# Patient Record
Sex: Male | Born: 1975 | Race: White | Hispanic: No | Smoking: Current every day smoker
Health system: Southern US, Community
[De-identification: ages and names within clinical notes are randomized; demographics above are authoritative.]

## PROBLEM LIST (undated history)

## (undated) DIAGNOSIS — I429 Cardiomyopathy, unspecified: Secondary | ICD-10-CM

## (undated) DIAGNOSIS — I509 Heart failure, unspecified: Secondary | ICD-10-CM

## (undated) DIAGNOSIS — F1910 Other psychoactive substance abuse, uncomplicated: Secondary | ICD-10-CM

---

## 2018-07-18 DIAGNOSIS — I517 Cardiomegaly: Secondary | ICD-10-CM

## 2018-07-18 DIAGNOSIS — F1910 Other psychoactive substance abuse, uncomplicated: Secondary | ICD-10-CM

## 2018-07-18 DIAGNOSIS — I5023 Acute on chronic systolic (congestive) heart failure: Secondary | ICD-10-CM

## 2018-07-18 DIAGNOSIS — D72829 Elevated white blood cell count, unspecified: Secondary | ICD-10-CM

## 2018-07-18 DIAGNOSIS — R0602 Shortness of breath: Secondary | ICD-10-CM

## 2018-07-18 DIAGNOSIS — R0603 Acute respiratory distress: Secondary | ICD-10-CM

## 2018-08-26 DIAGNOSIS — D72829 Elevated white blood cell count, unspecified: Secondary | ICD-10-CM

## 2018-08-26 DIAGNOSIS — I5023 Acute on chronic systolic (congestive) heart failure: Secondary | ICD-10-CM

## 2018-08-27 ENCOUNTER — Inpatient Hospital Stay (HOSPITAL_COMMUNITY)
Admission: AD | Admit: 2018-08-27 | Discharge: 2018-10-12 | DRG: 870 | Disposition: E | Payer: Medicaid Other | Source: Other Acute Inpatient Hospital | Attending: Pulmonary Disease | Admitting: Pulmonary Disease

## 2018-08-27 ENCOUNTER — Inpatient Hospital Stay (HOSPITAL_COMMUNITY): Payer: Medicaid Other

## 2018-08-27 DIAGNOSIS — I472 Ventricular tachycardia: Secondary | ICD-10-CM | POA: Diagnosis present

## 2018-08-27 DIAGNOSIS — I427 Cardiomyopathy due to drug and external agent: Secondary | ICD-10-CM | POA: Diagnosis not present

## 2018-08-27 DIAGNOSIS — E162 Hypoglycemia, unspecified: Secondary | ICD-10-CM | POA: Diagnosis present

## 2018-08-27 DIAGNOSIS — G8194 Hemiplegia, unspecified affecting left nondominant side: Secondary | ICD-10-CM | POA: Diagnosis not present

## 2018-08-27 DIAGNOSIS — Z9911 Dependence on respirator [ventilator] status: Secondary | ICD-10-CM | POA: Diagnosis not present

## 2018-08-27 DIAGNOSIS — G92 Toxic encephalopathy: Secondary | ICD-10-CM | POA: Diagnosis not present

## 2018-08-27 DIAGNOSIS — E870 Hyperosmolality and hypernatremia: Secondary | ICD-10-CM | POA: Diagnosis not present

## 2018-08-27 DIAGNOSIS — J9621 Acute and chronic respiratory failure with hypoxia: Secondary | ICD-10-CM | POA: Diagnosis present

## 2018-08-27 DIAGNOSIS — R0902 Hypoxemia: Secondary | ICD-10-CM

## 2018-08-27 DIAGNOSIS — K8000 Calculus of gallbladder with acute cholecystitis without obstruction: Secondary | ICD-10-CM | POA: Diagnosis not present

## 2018-08-27 DIAGNOSIS — Z9119 Patient's noncompliance with other medical treatment and regimen: Secondary | ICD-10-CM

## 2018-08-27 DIAGNOSIS — I5082 Biventricular heart failure: Secondary | ICD-10-CM | POA: Diagnosis present

## 2018-08-27 DIAGNOSIS — I480 Paroxysmal atrial fibrillation: Secondary | ICD-10-CM | POA: Diagnosis not present

## 2018-08-27 DIAGNOSIS — I5023 Acute on chronic systolic (congestive) heart failure: Secondary | ICD-10-CM | POA: Diagnosis present

## 2018-08-27 DIAGNOSIS — K810 Acute cholecystitis: Secondary | ICD-10-CM

## 2018-08-27 DIAGNOSIS — T4275XA Adverse effect of unspecified antiepileptic and sedative-hypnotic drugs, initial encounter: Secondary | ICD-10-CM | POA: Diagnosis not present

## 2018-08-27 DIAGNOSIS — R7989 Other specified abnormal findings of blood chemistry: Secondary | ICD-10-CM

## 2018-08-27 DIAGNOSIS — N170 Acute kidney failure with tubular necrosis: Secondary | ICD-10-CM | POA: Diagnosis present

## 2018-08-27 DIAGNOSIS — Z978 Presence of other specified devices: Secondary | ICD-10-CM

## 2018-08-27 DIAGNOSIS — J9690 Respiratory failure, unspecified, unspecified whether with hypoxia or hypercapnia: Secondary | ICD-10-CM

## 2018-08-27 DIAGNOSIS — K567 Ileus, unspecified: Secondary | ICD-10-CM | POA: Diagnosis present

## 2018-08-27 DIAGNOSIS — K7200 Acute and subacute hepatic failure without coma: Secondary | ICD-10-CM | POA: Diagnosis present

## 2018-08-27 DIAGNOSIS — I110 Hypertensive heart disease with heart failure: Secondary | ICD-10-CM | POA: Diagnosis present

## 2018-08-27 DIAGNOSIS — A419 Sepsis, unspecified organism: Principal | ICD-10-CM | POA: Diagnosis present

## 2018-08-27 DIAGNOSIS — J14 Pneumonia due to Hemophilus influenzae: Secondary | ICD-10-CM | POA: Diagnosis present

## 2018-08-27 DIAGNOSIS — E872 Acidosis: Secondary | ICD-10-CM | POA: Diagnosis present

## 2018-08-27 DIAGNOSIS — Z515 Encounter for palliative care: Secondary | ICD-10-CM | POA: Diagnosis not present

## 2018-08-27 DIAGNOSIS — Z79899 Other long term (current) drug therapy: Secondary | ICD-10-CM

## 2018-08-27 DIAGNOSIS — R6521 Severe sepsis with septic shock: Secondary | ICD-10-CM | POA: Diagnosis present

## 2018-08-27 DIAGNOSIS — I340 Nonrheumatic mitral (valve) insufficiency: Secondary | ICD-10-CM

## 2018-08-27 DIAGNOSIS — Y92230 Patient room in hospital as the place of occurrence of the external cause: Secondary | ICD-10-CM | POA: Diagnosis not present

## 2018-08-27 DIAGNOSIS — F1910 Other psychoactive substance abuse, uncomplicated: Secondary | ICD-10-CM

## 2018-08-27 DIAGNOSIS — F1510 Other stimulant abuse, uncomplicated: Secondary | ICD-10-CM | POA: Diagnosis present

## 2018-08-27 DIAGNOSIS — R570 Cardiogenic shock: Secondary | ICD-10-CM | POA: Diagnosis present

## 2018-08-27 DIAGNOSIS — E873 Alkalosis: Secondary | ICD-10-CM | POA: Diagnosis not present

## 2018-08-27 DIAGNOSIS — R17 Unspecified jaundice: Secondary | ICD-10-CM

## 2018-08-27 DIAGNOSIS — T43621S Poisoning by amphetamines, accidental (unintentional), sequela: Secondary | ICD-10-CM | POA: Diagnosis not present

## 2018-08-27 DIAGNOSIS — D689 Coagulation defect, unspecified: Secondary | ICD-10-CM | POA: Diagnosis not present

## 2018-08-27 DIAGNOSIS — I63411 Cerebral infarction due to embolism of right middle cerebral artery: Secondary | ICD-10-CM | POA: Diagnosis not present

## 2018-08-27 DIAGNOSIS — Z66 Do not resuscitate: Secondary | ICD-10-CM | POA: Diagnosis not present

## 2018-08-27 DIAGNOSIS — I6340 Cerebral infarction due to embolism of unspecified cerebral artery: Secondary | ICD-10-CM

## 2018-08-27 DIAGNOSIS — J9600 Acute respiratory failure, unspecified whether with hypoxia or hypercapnia: Secondary | ICD-10-CM

## 2018-08-27 DIAGNOSIS — F1210 Cannabis abuse, uncomplicated: Secondary | ICD-10-CM | POA: Diagnosis present

## 2018-08-27 DIAGNOSIS — R000 Tachycardia, unspecified: Secondary | ICD-10-CM

## 2018-08-27 DIAGNOSIS — J9601 Acute respiratory failure with hypoxia: Secondary | ICD-10-CM

## 2018-08-27 DIAGNOSIS — I509 Heart failure, unspecified: Secondary | ICD-10-CM

## 2018-08-27 DIAGNOSIS — Z0189 Encounter for other specified special examinations: Secondary | ICD-10-CM

## 2018-08-27 DIAGNOSIS — Z7189 Other specified counseling: Secondary | ICD-10-CM

## 2018-08-27 DIAGNOSIS — N19 Unspecified kidney failure: Secondary | ICD-10-CM

## 2018-08-27 DIAGNOSIS — I517 Cardiomegaly: Secondary | ICD-10-CM

## 2018-08-27 DIAGNOSIS — Z880 Allergy status to penicillin: Secondary | ICD-10-CM

## 2018-08-27 DIAGNOSIS — R140 Abdominal distension (gaseous): Secondary | ICD-10-CM

## 2018-08-27 DIAGNOSIS — I361 Nonrheumatic tricuspid (valve) insufficiency: Secondary | ICD-10-CM

## 2018-08-27 DIAGNOSIS — K819 Cholecystitis, unspecified: Secondary | ICD-10-CM

## 2018-08-27 DIAGNOSIS — I5043 Acute on chronic combined systolic (congestive) and diastolic (congestive) heart failure: Secondary | ICD-10-CM

## 2018-08-27 DIAGNOSIS — I2510 Atherosclerotic heart disease of native coronary artery without angina pectoris: Secondary | ICD-10-CM | POA: Diagnosis present

## 2018-08-27 DIAGNOSIS — R945 Abnormal results of liver function studies: Secondary | ICD-10-CM

## 2018-08-27 HISTORY — DX: Heart failure, unspecified: I50.9

## 2018-08-27 HISTORY — DX: Cardiomyopathy, unspecified: I42.9

## 2018-08-27 HISTORY — DX: Other psychoactive substance abuse, uncomplicated: F19.10

## 2018-08-27 LAB — BASIC METABOLIC PANEL
ANION GAP: 12 (ref 5–15)
BUN: 20 mg/dL (ref 6–20)
CO2: 21 mmol/L — ABNORMAL LOW (ref 22–32)
Calcium: 8.2 mg/dL — ABNORMAL LOW (ref 8.9–10.3)
Chloride: 101 mmol/L (ref 98–111)
Creatinine, Ser: 2.21 mg/dL — ABNORMAL HIGH (ref 0.61–1.24)
GFR calc Af Amer: 41 mL/min — ABNORMAL LOW (ref >60)
GFR calc non Af Amer: 35 mL/min — ABNORMAL LOW (ref >60)
GLUCOSE: 64 mg/dL — AB (ref 70–99)
Potassium: 4.9 mmol/L (ref 3.5–5.1)
SODIUM: 134 mmol/L — AB (ref 135–145)

## 2018-08-27 LAB — CBC
HEMATOCRIT: 45.2 % (ref 39.0–52.0)
HEMOGLOBIN: 14.3 g/dL (ref 13.0–17.0)
MCH: 27.7 pg (ref 26.0–34.0)
MCHC: 31.6 g/dL (ref 30.0–36.0)
MCV: 87.6 fL (ref 80.0–100.0)
Platelets: 207 10*3/uL (ref 150–400)
RBC: 5.16 MIL/uL (ref 4.22–5.81)
RDW: 16.6 % — AB (ref 11.5–15.5)
WBC: 22.4 10*3/uL — ABNORMAL HIGH (ref 4.0–10.5)
nRBC: 0 % (ref 0.0–0.2)

## 2018-08-27 LAB — PROTIME-INR
INR: 2.58
PROTHROMBIN TIME: 27.3 s — AB (ref 11.4–15.2)

## 2018-08-27 LAB — URINALYSIS, ROUTINE W REFLEX MICROSCOPIC
Bilirubin Urine: NEGATIVE
Glucose, UA: NEGATIVE mg/dL
Ketones, ur: NEGATIVE mg/dL
Leukocytes, UA: NEGATIVE
Nitrite: NEGATIVE
PROTEIN: 100 mg/dL — AB
Specific Gravity, Urine: 1.015 (ref 1.005–1.030)
pH: 5 (ref 5.0–8.0)

## 2018-08-27 LAB — HEPATIC FUNCTION PANEL
ALT: 2177 U/L — ABNORMAL HIGH (ref 0–44)
AST: 3397 U/L — AB (ref 15–41)
Albumin: 3 g/dL — ABNORMAL LOW (ref 3.5–5.0)
Alkaline Phosphatase: 114 U/L (ref 38–126)
BILIRUBIN DIRECT: 2.9 mg/dL — AB (ref 0.0–0.2)
Indirect Bilirubin: 2.4 mg/dL — ABNORMAL HIGH (ref 0.3–0.9)
TOTAL PROTEIN: 6.4 g/dL — AB (ref 6.5–8.1)
Total Bilirubin: 5.3 mg/dL — ABNORMAL HIGH (ref 0.3–1.2)

## 2018-08-27 LAB — COOXEMETRY PANEL
CARBOXYHEMOGLOBIN: 1.1 % (ref 0.5–1.5)
Carboxyhemoglobin: 1.1 % (ref 0.5–1.5)
Methemoglobin: 0.9 % (ref 0.0–1.5)
Methemoglobin: 1.6 % — ABNORMAL HIGH (ref 0.0–1.5)
O2 SAT: 81.1 %
O2 Saturation: 64.5 %
TOTAL HEMOGLOBIN: 14.7 g/dL (ref 12.0–16.0)
Total hemoglobin: 14.6 g/dL (ref 12.0–16.0)

## 2018-08-27 LAB — GLUCOSE, CAPILLARY: GLUCOSE-CAPILLARY: 124 mg/dL — AB (ref 70–99)

## 2018-08-27 LAB — POCT I-STAT 3, ART BLOOD GAS (G3+)
Acid-base deficit: 2 mmol/L (ref 0.0–2.0)
BICARBONATE: 23.1 mmol/L (ref 20.0–28.0)
O2 Saturation: 99 %
TCO2: 24 mmol/L (ref 22–32)
pCO2 arterial: 39.8 mmHg (ref 32.0–48.0)
pH, Arterial: 7.374 (ref 7.350–7.450)
pO2, Arterial: 146 mmHg — ABNORMAL HIGH (ref 83.0–108.0)

## 2018-08-27 LAB — MRSA PCR SCREENING: MRSA BY PCR: POSITIVE — AB

## 2018-08-27 LAB — PHOSPHORUS: Phosphorus: 7.2 mg/dL — ABNORMAL HIGH (ref 2.5–4.6)

## 2018-08-27 LAB — APTT: aPTT: 70 seconds — ABNORMAL HIGH (ref 24–36)

## 2018-08-27 LAB — LACTIC ACID, PLASMA: Lactic Acid, Venous: 3.1 mmol/L (ref 0.5–1.9)

## 2018-08-27 LAB — TRIGLYCERIDES: Triglycerides: 47 mg/dL (ref <150)

## 2018-08-27 LAB — MAGNESIUM: MAGNESIUM: 2.1 mg/dL (ref 1.7–2.4)

## 2018-08-27 LAB — HEPARIN LEVEL (UNFRACTIONATED): HEPARIN UNFRACTIONATED: 0.2 [IU]/mL — AB (ref 0.30–0.70)

## 2018-08-27 MED ORDER — FENTANYL 2500MCG IN NS 250ML (10MCG/ML) PREMIX INFUSION
0.0000 ug/h | INTRAVENOUS | Status: DC
  Administered 2018-08-27: 100 ug/h via INTRAVENOUS
  Administered 2018-08-28: 375 ug/h via INTRAVENOUS
  Administered 2018-08-28 – 2018-08-29 (×4): 400 ug/h via INTRAVENOUS
  Administered 2018-08-29: 300 ug/h via INTRAVENOUS
  Administered 2018-08-29: 400 ug/h via INTRAVENOUS
  Administered 2018-08-30: 325 ug/h via INTRAVENOUS
  Administered 2018-08-30: 250 ug/h via INTRAVENOUS
  Administered 2018-08-30: 350 ug/h via INTRAVENOUS
  Administered 2018-08-31: 375 ug/h via INTRAVENOUS
  Administered 2018-08-31: 325 ug/h via INTRAVENOUS
  Administered 2018-08-31 – 2018-09-01 (×2): 400 ug/h via INTRAVENOUS
  Administered 2018-09-01: 200 ug/h via INTRAVENOUS
  Administered 2018-09-02: 325 ug/h via INTRAVENOUS
  Administered 2018-09-02: 300 ug/h via INTRAVENOUS
  Filled 2018-08-27 (×18): qty 250

## 2018-08-27 MED ORDER — PROPOFOL 1000 MG/100ML IV EMUL
INTRAVENOUS | Status: AC
  Filled 2018-08-27: qty 100

## 2018-08-27 MED ORDER — CHLORHEXIDINE GLUCONATE CLOTH 2 % EX PADS
6.0000 | MEDICATED_PAD | Freq: Every day | CUTANEOUS | Status: AC
  Administered 2018-08-27 – 2018-08-29 (×3): 6 via TOPICAL

## 2018-08-27 MED ORDER — ASPIRIN 300 MG RE SUPP
300.0000 mg | RECTAL | Status: AC
  Administered 2018-08-27: 300 mg via RECTAL
  Filled 2018-08-27: qty 1

## 2018-08-27 MED ORDER — ASPIRIN 81 MG PO CHEW: 324.0000 mg | CHEWABLE_TABLET | ORAL | Status: AC

## 2018-08-27 MED ORDER — NOREPINEPHRINE 16 MG/250ML-% IV SOLN
0.0000 ug/min | INTRAVENOUS | Status: DC
  Administered 2018-08-27: 2 ug/min via INTRAVENOUS
  Administered 2018-08-28 (×2): 60 ug/min via INTRAVENOUS
  Administered 2018-08-28: 70 ug/min via INTRAVENOUS
  Administered 2018-08-29: 40 ug/min via INTRAVENOUS
  Administered 2018-08-29: 22 ug/min via INTRAVENOUS
  Administered 2018-08-29: 65 ug/min via INTRAVENOUS
  Administered 2018-08-30: 18 ug/min via INTRAVENOUS
  Administered 2018-08-31: 12 ug/min via INTRAVENOUS
  Administered 2018-09-04 – 2018-09-08 (×2): 2 ug/min via INTRAVENOUS
  Filled 2018-08-27 (×15): qty 250

## 2018-08-27 MED ORDER — FUROSEMIDE 10 MG/ML IJ SOLN
80.0000 mg | Freq: Once | INTRAMUSCULAR | Status: AC
  Administered 2018-08-27: 80 mg via INTRAVENOUS
  Filled 2018-08-27: qty 8

## 2018-08-27 MED ORDER — MILRINONE LACTATE IN DEXTROSE 20-5 MG/100ML-% IV SOLN
0.3750 ug/kg/min | INTRAVENOUS | Status: DC
  Administered 2018-08-27 – 2018-08-28 (×2): 0.375 ug/kg/min via INTRAVENOUS
  Filled 2018-08-27 (×3): qty 100

## 2018-08-27 MED ORDER — FAMOTIDINE IN NACL 20-0.9 MG/50ML-% IV SOLN
20.0000 mg | Freq: Two times a day (BID) | INTRAVENOUS | Status: DC
  Administered 2018-08-27 – 2018-08-29 (×4): 20 mg via INTRAVENOUS
  Filled 2018-08-27 (×4): qty 50

## 2018-08-27 MED ORDER — MUPIROCIN 2 % EX OINT
1.0000 "application " | TOPICAL_OINTMENT | Freq: Two times a day (BID) | CUTANEOUS | Status: AC
  Administered 2018-08-27 – 2018-09-01 (×10): 1 via NASAL
  Filled 2018-08-27 (×3): qty 22

## 2018-08-27 MED ORDER — ORAL CARE MOUTH RINSE
15.0000 mL | OROMUCOSAL | Status: DC
  Administered 2018-08-27 – 2018-08-30 (×29): 15 mL via OROMUCOSAL

## 2018-08-27 MED ORDER — CHLORHEXIDINE GLUCONATE 0.12% ORAL RINSE (MEDLINE KIT)
15.0000 mL | Freq: Two times a day (BID) | OROMUCOSAL | Status: DC
  Administered 2018-08-27 – 2018-08-30 (×5): 15 mL via OROMUCOSAL

## 2018-08-27 MED ORDER — PROPOFOL 1000 MG/100ML IV EMUL
5.0000 ug/kg/min | INTRAVENOUS | Status: DC
  Administered 2018-08-27: 20 ug/kg/min via INTRAVENOUS
  Filled 2018-08-27: qty 100

## 2018-08-27 MED ORDER — HEPARIN (PORCINE) IN NACL 100-0.45 UNIT/ML-% IJ SOLN
1550.0000 [IU]/h | INTRAMUSCULAR | Status: DC
  Administered 2018-08-27: 1400 [IU]/h via INTRAVENOUS
  Administered 2018-08-28: 1550 [IU]/h via INTRAVENOUS
  Filled 2018-08-27: qty 250

## 2018-08-27 MED ORDER — SODIUM CHLORIDE 0.9 % IV SOLN
250.0000 mL | INTRAVENOUS | Status: DC
  Administered 2018-08-27: 250 mL via INTRAVENOUS

## 2018-08-27 NOTE — Progress Notes (Signed)
Bensimhon, MD called and notified of repeat CoOx and increased heart rate in the 120s-130s sinus tach. Verbal orders to keep systolic blood pressure above 95 and make sure patient has adequate urine output. Will initiate orders and continue to monitor patient.

## 2018-08-27 NOTE — Plan of Care (Signed)
  Problem: Cardiac: Goal: Ability to achieve and maintain adequate cardiopulmonary perfusion will improve Outcome: Progressing   Problem: Clinical Measurements: Goal: Ability to maintain clinical measurements within normal limits will improve Outcome: Progressing Goal: Will remain free from infection Outcome: Progressing Goal: Respiratory complications will improve Outcome: Progressing Goal: Cardiovascular complication will be avoided Outcome: Progressing   Problem: Education: Goal: Ability to demonstrate management of disease process will improve Outcome: Not Progressing Note:  Patient sedated and ventilated. Goal: Ability to verbalize understanding of medication therapies will improve Outcome: Not Progressing Note:  Patient sedated and intubated.

## 2018-08-27 NOTE — Progress Notes (Signed)
[facility] for heparin Indication: r/o PE  Allergies  Allergen Reactions  . Amoxicillin Swelling  . Penicillins Swelling    Patient Measurements: Height: 6' (182.9 cm) Weight: 202 lb 13.2 oz (92 kg) IBW/kg (Calculated) : 77.6 Heparin Dosing Weight: 92 kg   Vital Signs: Temp: 98.9 F (37.2 C) (10/16 2000) Temp Source: Oral (10/16 2000) BP: 121/71 (10/16 2330) Pulse Rate: 103 (10/16 1726)  Labs: Recent Labs    08/14/2018 1851 08/21/2018 2005  HGB 14.3  --   HCT 45.2  --   PLT 207  --   APTT 70*  --   LABPROT 27.3*  --   INR 2.58  --   HEPARINUNFRC  --  0.20*  CREATININE 2.21*  --     Estimated Creatinine Clearance: 48.8 mL/min (A) (by C-G formula based on SCr of 2.21 mg/dL (H)).   Medical History: No past medical history on file.  Medications:  Scheduled:  . chlorhexidine gluconate (MEDLINE KIT)  15 mL Mouth Rinse BID  . [START ON 08/22/2018] Chlorhexidine Gluconate Cloth  6 each Topical Q0600  . mouth rinse  15 mL Mouth Rinse 10 times per day  . mupirocin ointment  1 application Nasal BID    Assessment: 32 yom presented to OSH with hx of CHF (LVEF 10%), with s/sx of low output. D-dimer at OSH was 682. Started on heparin bolus of 5000 units then 1400 units/hr on 10/16 ~1100.   LFTs and Scr at OSH WNL >> AST 3397/ALT 2177, Scr 2.21 tonight. INR 2.58 likely secondary to shock liver. Hgb 14.3, plt 207. No s/sx of bleeding.    Heparin level 0.20 units/ml  Goal of Therapy:  Heparin level 0.3-0.7 units/ml Monitor platelets by anticoagulation protocol: Yes   Plan:  Increase heparin infusion to 1550 units/hr  Monitor daily HL, CBC, and for s/sx of bleeding   Thanks for allowing pharmacy to be a part of this patient's care.  Excell Seltzer, PharmD Clinical Pharmacist

## 2018-08-27 NOTE — Progress Notes (Signed)
CRITICAL VALUE ALERT  Critical Value:  Lactic 3.1  Date & Time Notied:  08/19/2018   Provider Notified: Denese Killings, MD  Orders Received/Actions taken: Verbal orders to repeat Lactic in 4 hours and keep MAP 60-65.

## 2018-08-27 NOTE — H&P (Signed)
NAME:  Cody Patterson, MRN:  [id number], DOB:  June 27, 1976, LOS: 0 ADMISSION DATE:  08/18/2018, CONSULTATION DATE:  09/04/2018 REFERRING MD:  Derek Mound, CHIEF COMPLAINT:  Cardiogenic shock   Brief History   42 year old man with history of 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 Presented to [facility] with increasing dyspnea. Acute decompensation with hypotension and severe respiratory distress requiring intubation. Transferred to [facility] for management of advanced heart failure.   Past Medical History  Known cardiomyopathy and drug abuse (THC and methamphetamines) Significant Hospital Events   Intubated 10/16 at [facility].  Consults: date of consult/date signed off & final recs:  Cardiology consultation pending  Procedures (surgical and bedside):  R subclavian CVC 10/16 RH L femoral arterial line 10/16 [facility]  Significant Diagnostic Tests:  Echocardiogram from [facility] 10/16: LV moderately dilated, severely impaired EF at 10%. Moderate RV dysfunction. Global hypokinesis. Moderately elevated RVSP.  Micro Data:  N/A  Antimicrobials:  N/A   Subjective:  Received from [facility] intubated. Given glucose on route for CBG of 20.  Objective   Blood pressure (!) 84/65, pulse (!) 103, resp. rate 20, height 6' (1.829 m), weight 92 kg, SpO2 100 %.    Vent Mode: PRVC FiO2 (%):  [40 %] 40 % Set Rate:  [16 bmp] 16 bmp Vt Set:  [440 mL] 620 mL PEEP:  [10 cmH20] 10 cmH20  No intake or output data in the 24 hours ending 09/09/2018 1918 Filed Weights   09/05/2018 1845  Weight: 92 kg    Examination: General: Well nourished man. Currently sedated. HENT: ETT and OGT in place with no pressure ulcers. Lungs: Mechanically ventilated on PRVC with no asynchrony. Cardiovascular: Extremities cool and mottled.  Abdomen: Scaphoid. Soft and non-tender. Extremities: No edema Neuro: Sedated with no response to painful stimuli GU: Foley in place with small quantity of concentrated  urine.  I performed a focused echocardiogram which re-demonstrated biventricular failure with IVC plethora.  Resolved Hospital Problem list   N/A  Assessment & Plan:   Patient is critically ill due to decompensated systolic heart failure with cardiogenic shock (hemodynamic class C - cold and wet) requiring initiation and titration of vasoactive infusion to restore adequate BP and organ perfusion.   Have started norepinephrine to offset any initial excessive vasodilatation from milrinone. Goal to wean NE off if possible. Continuous BP monitoring to target MAP>65. Monitor perfusion by following hourly urine output, ScvO2 and lactate clearance. Given his history of non-compliance not a candidate for long-term MCS, but might benefit from short term support from [facility].  Cardiology consultation.   Oliguric renal failure - repeat chemistry pending, but urine output has improved with initiation of vasopressors and motteling has also improved.  Foley necessary to accurately measure urine output. Follow chemistry. Once perfusion improves and able to maintain consistent blood pressure, will attempt to diurese.  May require CRRT.  Acute hypoxic respiratory failure due to pulmonary edema requiring mechanical ventilation. Improving gas exchange on repeat ABG. Continue to maintain lung protective ventilation with full support until hemodynamics improve and able to clear edema.  Disposition / Summary of Today's Plan 09/02/2018   Improve hemodynamics overnight. Attempt to diureses   Best Practice   Diet: NPO -trickle feeds Pain/Anxiety/Delirium protocol (if indicated): Fentanyl and propofol. VAP protocol (if indicated):  Bundle in place. DVT prophylaxis: systemic heparin. GI prophylaxis: Famotidine Hyperglycemia protocol: monitor for hypoglycemia Mobility: bedrest Code Status: full Family Communication: no contact with family since  arrival here.  Labs personally reviewed.  CBC: reactive  leukocytosis. Recent Labs  Lab 09/03/2018 1851  WBC 22.4*  HGB 14.3  HCT 45.2  MCV 87.6  PLT 207    Basic Metabolic Panel: pending No results for input(s): NA, K, CL, CO2, GLUCOSE, BUN, CREATININE, CALCIUM, MG, PHOS in the last 168 hours. GFR: CrCl cannot be calculated (No successful lab value found.). Recent Labs  Lab 08/26/2018 1851  WBC 22.4*    Liver Function Tests: No results for input(s): AST, ALT, ALKPHOS, BILITOT, PROT, ALBUMIN in the last 168 hours. No results for input(s): LIPASE, AMYLASE in the last 168 hours. No results for input(s): AMMONIA in the last 168 hours.  ABG    Component Value Date/Time   PHART 7.374 08/30/2018 1855   PCO2ART 39.8 09/05/2018 1855   PO2ART 146.0 (H) 08/16/2018 1855   HCO3 23.1 08/17/2018 1855   TCO2 24 08/13/2018 1855   ACIDBASEDEF 2.0 08/21/2018 1855   O2SAT 99.0 09/01/2018 1855     Coagulation Profile: No results for input(s): INR, PROTIME in the last 168 hours.  Cardiac Enzymes: No results for input(s): CKTOTAL, CKMB, CKMBINDEX, TROPONINI in the last 168 hours.  HbA1C: No results found for: HGBA1C  CBG: Recent Labs  Lab 09/06/2018 1732  GLUCAP 124*    Admitting History of Present Illness.   42 year male with history of systolic heart failure. Several admissions for SOB recently but has left AMA prior to completing work-up. Presented with increasing dyspnea, leg edema, orthopnea and PND. NYHA III-IV symptoms at home. Last used methamphetamine 10 days ago. In ED at [facility] was SOB with BNP 11800. Initially admitted to hospitalist before decompensation, but became very dyspneic and mottled and required intubation. Pressors not started due to tachycardia.  History from chart from OSH as patient unable to participate due to sedation.  Review of Systems:   Unable to obtain due to sedation  Past Medical History  As above.  Surgical History   No known surgical history   Social History   Social History   Socioeconomic  History  . Marital status: Legally Separated    Spouse name: Not on file  . Number of children: Not on file  . Years of education: Not on file  . Highest education level: Not on file  Occupational History  . Not on file  Social Needs  . Financial resource strain: Not on file  . Food insecurity:    Worry: Not on file    Inability: Not on file  . Transportation needs:    Medical: Not on file    Non-medical: Not on file  Tobacco Use  . Smoking status: Not on file  Substance and Sexual Activity  . Alcohol use: Not on file  . Drug use: Not on file  . Sexual activity: Not on file  Lifestyle  . Physical activity:    Days per week: Not on file    Minutes per session: Not on file  . Stress: Not on file  Relationships  . Social connections:    Talks on phone: Not on file    Gets together: Not on file    Attends religious service: Not on file    Active member of club or organization: Not on file    Attends meetings of clubs or organizations: Not on file    Relationship status: Not on file  . Intimate partner violence:    Fear of current or ex partner: Not on file  Emotionally abused: Not on file    Physically abused: Not on file    Forced sexual activity: Not on file  Other Topics Concern  . Not on file  Social History Narrative  . Not on file  ,     Family History   His family history is not on file.   Allergies Not on File   Home Medications  Prior to Admission medications   Not on File     Critical care time: 60 min spent on assessing patient, reviewing chart information, discussing plan of care with nursing, ordering and reassessing therapies, but not including separately billed procedures.    Lynnell Catalan, MD [facility] ICU Physician [facility]  Pager: [phone] Mobile: [phone] After hours: [phone].

## 2018-08-27 NOTE — Progress Notes (Signed)
ANTICOAGULATION CONSULT NOTE - Initial Consult  Pharmacy Consult for heparin Indication: r/o PE  Allergies  Allergen Reactions  . Amoxicillin Swelling  . Penicillins Swelling    Patient Measurements: Height: 6' (182.9 cm) Weight: 202 lb 13.2 oz (92 kg) IBW/kg (Calculated) : 77.6 Heparin Dosing Weight: 92 kg   Vital Signs: Temp: 98.9 F (37.2 C) (10/16 2000) Temp Source: Oral (10/16 2000) BP: 84/65 (10/16 1800) Pulse Rate: 103 (10/16 1726)  Labs: Recent Labs    09/06/2018 1851  HGB 14.3  HCT 45.2  PLT 207  APTT 70*  LABPROT 27.3*  INR 2.58  CREATININE 2.21*    Estimated Creatinine Clearance: 48.8 mL/min (A) (by C-G formula based on SCr of 2.21 mg/dL (H)).   Medical History: No past medical history on file.  Medications:  Scheduled:  . chlorhexidine gluconate (MEDLINE KIT)  15 mL Mouth Rinse BID  . mouth rinse  15 mL Mouth Rinse 10 times per day    Assessment: 71 yom presented to OSH with hx of CHF (LVEF 10%), with s/sx of low output. D-dimer at OSH was 682. Started on heparin bolus of 5000 units then 1400 units/hr on 10/16 ~1100.   LFTs and Scr at OSH WNL >> AST 3397/ALT 2177, Scr 2.21 tonight. INR 2.58 likely secondary to shock liver. Hgb 14.3, plt 207. No s/sx of bleeding.    Goal of Therapy:  Heparin level 0.3-0.7 units/ml Monitor platelets by anticoagulation protocol: Yes   Plan:  Continue heparin infusion at 1400 units/hr  Order heparin level now Monitor daily HL, CBC, and for s/sx of bleeding   Doylene Canard, PharmD Clinical Pharmacist  Pager: [phone] Phone: [phone] 08/24/2018,8:43 PM

## 2018-08-28 ENCOUNTER — Inpatient Hospital Stay (HOSPITAL_COMMUNITY): Payer: Medicaid Other

## 2018-08-28 ENCOUNTER — Encounter (HOSPITAL_COMMUNITY): Admission: AD | Disposition: E | Payer: Self-pay | Source: Other Acute Inpatient Hospital | Attending: Internal Medicine

## 2018-08-28 DIAGNOSIS — Z86718 Personal history of other venous thrombosis and embolism: Secondary | ICD-10-CM

## 2018-08-28 DIAGNOSIS — I425 Other restrictive cardiomyopathy: Secondary | ICD-10-CM

## 2018-08-28 DIAGNOSIS — R570 Cardiogenic shock: Secondary | ICD-10-CM

## 2018-08-28 DIAGNOSIS — A419 Sepsis, unspecified organism: Principal | ICD-10-CM

## 2018-08-28 DIAGNOSIS — R6521 Severe sepsis with septic shock: Secondary | ICD-10-CM

## 2018-08-28 LAB — POCT I-STAT 3, ART BLOOD GAS (G3+)
ACID-BASE DEFICIT: 4 mmol/L — AB (ref 0.0–2.0)
ACID-BASE DEFICIT: 4 mmol/L — AB (ref 0.0–2.0)
Acid-base deficit: 6 mmol/L — ABNORMAL HIGH (ref 0.0–2.0)
Acid-base deficit: 6 mmol/L — ABNORMAL HIGH (ref 0.0–2.0)
BICARBONATE: 21.7 mmol/L (ref 20.0–28.0)
BICARBONATE: 21.7 mmol/L (ref 20.0–28.0)
Bicarbonate: 22.2 mmol/L (ref 20.0–28.0)
Bicarbonate: 19.3 mmol/L — ABNORMAL LOW (ref 20.0–28.0)
O2 SAT: 98 %
O2 Saturation: 96 %
O2 Saturation: 99 %
O2 Saturation: 97 %
PCO2 ART: 56.7 mmHg — AB (ref 32.0–48.0)
PCO2 ART: 37.1 mmHg (ref 32.0–48.0)
PH ART: 7.201 — AB (ref 7.350–7.450)
PO2 ART: 106 mmHg (ref 83.0–108.0)
PO2 ART: 104 mmHg (ref 83.0–108.0)
Patient temperature: 37.2
Patient temperature: 37.6
TCO2: 24 mmol/L (ref 22–32)
TCO2: 23 mmol/L (ref 22–32)
TCO2: 20 mmol/L — AB (ref 22–32)
TCO2: 23 mmol/L (ref 22–32)
pCO2 arterial: 38.6 mmHg (ref 32.0–48.0)
pCO2 arterial: 41.1 mmHg (ref 32.0–48.0)
pH, Arterial: 7.356 (ref 7.350–7.450)
pH, Arterial: 7.327 — ABNORMAL LOW (ref 7.350–7.450)
pH, Arterial: 7.333 — ABNORMAL LOW (ref 7.350–7.450)
pO2, Arterial: 103 mmHg (ref 83.0–108.0)
pO2, Arterial: 145 mmHg — ABNORMAL HIGH (ref 83.0–108.0)

## 2018-08-28 LAB — POCT I-STAT, CHEM 8
BUN: 35 mg/dL — ABNORMAL HIGH (ref 6–20)
BUN: 32 mg/dL — ABNORMAL HIGH (ref 6–20)
BUN: 36 mg/dL — AB (ref 6–20)
CALCIUM ION: 0.74 mmol/L — AB (ref 1.15–1.40)
CHLORIDE: 100 mmol/L (ref 98–111)
Calcium, Ion: 0.89 mmol/L — CL (ref 1.15–1.40)
Calcium, Ion: 1.02 mmol/L — ABNORMAL LOW (ref 1.15–1.40)
Chloride: 104 mmol/L (ref 98–111)
Chloride: 97 mmol/L — ABNORMAL LOW (ref 98–111)
Creatinine, Ser: 3.5 mg/dL — ABNORMAL HIGH (ref 0.61–1.24)
Creatinine, Ser: 3.2 mg/dL — ABNORMAL HIGH (ref 0.61–1.24)
Creatinine, Ser: 3.4 mg/dL — ABNORMAL HIGH (ref 0.61–1.24)
GLUCOSE: 100 mg/dL — AB (ref 70–99)
Glucose, Bld: 99 mg/dL (ref 70–99)
Glucose, Bld: 167 mg/dL — ABNORMAL HIGH (ref 70–99)
HEMATOCRIT: 38 % — AB (ref 39.0–52.0)
HEMATOCRIT: 38 % — AB (ref 39.0–52.0)
HEMATOCRIT: 33 % — AB (ref 39.0–52.0)
HEMOGLOBIN: 12.9 g/dL — AB (ref 13.0–17.0)
Hemoglobin: 12.9 g/dL — ABNORMAL LOW (ref 13.0–17.0)
Hemoglobin: 11.2 g/dL — ABNORMAL LOW (ref 13.0–17.0)
POTASSIUM: 5.1 mmol/L (ref 3.5–5.1)
Potassium: 4.4 mmol/L (ref 3.5–5.1)
Potassium: 4.6 mmol/L (ref 3.5–5.1)
SODIUM: 135 mmol/L (ref 135–145)
SODIUM: 137 mmol/L (ref 135–145)
Sodium: 134 mmol/L — ABNORMAL LOW (ref 135–145)
TCO2: 23 mmol/L (ref 22–32)
TCO2: 23 mmol/L (ref 22–32)
TCO2: 19 mmol/L — ABNORMAL LOW (ref 22–32)

## 2018-08-28 LAB — COMPREHENSIVE METABOLIC PANEL
ALT: 6537 U/L — ABNORMAL HIGH (ref 0–44)
AST: 11847 U/L — ABNORMAL HIGH (ref 15–41)
Albumin: 2.8 g/dL — ABNORMAL LOW (ref 3.5–5.0)
Alkaline Phosphatase: 112 U/L (ref 38–126)
Anion gap: 23 — ABNORMAL HIGH (ref 5–15)
BUN: 35 mg/dL — ABNORMAL HIGH (ref 6–20)
CHLORIDE: 94 mmol/L — AB (ref 98–111)
CO2: 19 mmol/L — ABNORMAL LOW (ref 22–32)
CREATININE: 3.9 mg/dL — AB (ref 0.61–1.24)
Calcium: 6.8 mg/dL — ABNORMAL LOW (ref 8.9–10.3)
GFR calc Af Amer: 21 mL/min — ABNORMAL LOW (ref >60)
GFR, EST NON AFRICAN AMERICAN: 18 mL/min — AB (ref >60)
Glucose, Bld: 190 mg/dL — ABNORMAL HIGH (ref 70–99)
Potassium: 5.4 mmol/L — ABNORMAL HIGH (ref 3.5–5.1)
Sodium: 136 mmol/L (ref 135–145)
Total Bilirubin: 5.7 mg/dL — ABNORMAL HIGH (ref 0.3–1.2)
Total Protein: 5.4 g/dL — ABNORMAL LOW (ref 6.5–8.1)

## 2018-08-28 LAB — CBC
HCT: 43.1 % (ref 39.0–52.0)
Hemoglobin: 14 g/dL (ref 13.0–17.0)
MCH: 28.2 pg (ref 26.0–34.0)
MCHC: 32.5 g/dL (ref 30.0–36.0)
MCV: 86.9 fL (ref 80.0–100.0)
NRBC: 0 % (ref 0.0–0.2)
PLATELETS: 215 10*3/uL (ref 150–400)
RBC: 4.96 MIL/uL (ref 4.22–5.81)
RDW: 16.7 % — AB (ref 11.5–15.5)
WBC: 22.9 10*3/uL — AB (ref 4.0–10.5)

## 2018-08-28 LAB — GLUCOSE, CAPILLARY
GLUCOSE-CAPILLARY: 30 mg/dL — AB (ref 70–99)
GLUCOSE-CAPILLARY: 109 mg/dL — AB (ref 70–99)
Glucose-Capillary: 60 mg/dL — ABNORMAL LOW (ref 70–99)
Glucose-Capillary: 213 mg/dL — ABNORMAL HIGH (ref 70–99)

## 2018-08-28 LAB — BLOOD GAS, ARTERIAL
ACID-BASE DEFICIT: 2.7 mmol/L — AB (ref 0.0–2.0)
ACID-BASE DEFICIT: 2.8 mmol/L — AB (ref 0.0–2.0)
BICARBONATE: 21.3 mmol/L (ref 20.0–28.0)
Bicarbonate: 21.8 mmol/L (ref 20.0–28.0)
FIO2: 40
FIO2: 40
LHR: 16 {breaths}/min
MECHVT: 620 mL
MECHVT: 620 mL
O2 Saturation: 98.7 %
O2 Saturation: 98.5 %
PATIENT TEMPERATURE: 98.6
PEEP/CPAP: 10 cmH2O
PEEP/CPAP: 5 cmH2O
PH ART: 7.405 (ref 7.350–7.450)
PO2 ART: 144 mmHg — AB (ref 83.0–108.0)
PO2 ART: 149 mmHg — AB (ref 83.0–108.0)
Patient temperature: 101.1
RATE: 16 resp/min
pCO2 arterial: 34.7 mmHg (ref 32.0–48.0)
pCO2 arterial: 42.5 mmHg (ref 32.0–48.0)
pH, Arterial: 7.339 — ABNORMAL LOW (ref 7.350–7.450)

## 2018-08-28 LAB — BASIC METABOLIC PANEL
ANION GAP: 16 — AB (ref 5–15)
Anion gap: 21 — ABNORMAL HIGH (ref 5–15)
BUN: 28 mg/dL — AB (ref 6–20)
BUN: 38 mg/dL — AB (ref 6–20)
CALCIUM: 7.8 mg/dL — AB (ref 8.9–10.3)
CHLORIDE: 93 mmol/L — AB (ref 98–111)
CO2: 21 mmol/L — AB (ref 22–32)
CO2: 19 mmol/L — ABNORMAL LOW (ref 22–32)
CREATININE: 2.69 mg/dL — AB (ref 0.61–1.24)
Calcium: 7.9 mg/dL — ABNORMAL LOW (ref 8.9–10.3)
Chloride: 98 mmol/L (ref 98–111)
Creatinine, Ser: 3.97 mg/dL — ABNORMAL HIGH (ref 0.61–1.24)
GFR calc Af Amer: 32 mL/min — ABNORMAL LOW (ref >60)
GFR calc Af Amer: 20 mL/min — ABNORMAL LOW (ref >60)
GFR calc non Af Amer: 17 mL/min — ABNORMAL LOW (ref >60)
GFR, EST NON AFRICAN AMERICAN: 28 mL/min — AB (ref >60)
GLUCOSE: 55 mg/dL — AB (ref 70–99)
GLUCOSE: 217 mg/dL — AB (ref 70–99)
POTASSIUM: 5.1 mmol/L (ref 3.5–5.1)
Potassium: 5.1 mmol/L (ref 3.5–5.1)
Sodium: 135 mmol/L (ref 135–145)
Sodium: 133 mmol/L — ABNORMAL LOW (ref 135–145)

## 2018-08-28 LAB — COOXEMETRY PANEL
CARBOXYHEMOGLOBIN: 1.4 % (ref 0.5–1.5)
Carboxyhemoglobin: 0.9 % (ref 0.5–1.5)
Methemoglobin: 1.5 % (ref 0.0–1.5)
Methemoglobin: 1.2 % (ref 0.0–1.5)
O2 Saturation: 85.8 %
O2 Saturation: 83 %
Total hemoglobin: 14.2 g/dL (ref 12.0–16.0)
Total hemoglobin: 11.1 g/dL — ABNORMAL LOW (ref 12.0–16.0)

## 2018-08-28 LAB — LACTIC ACID, PLASMA
LACTIC ACID, VENOUS: 5.5 mmol/L — AB (ref 0.5–1.9)
LACTIC ACID, VENOUS: 7.2 mmol/L — AB (ref 0.5–1.9)
LACTIC ACID, VENOUS: 4.8 mmol/L — AB (ref 0.5–1.9)
Lactic Acid, Venous: 8.7 mmol/L (ref 0.5–1.9)

## 2018-08-28 LAB — TSH: TSH: 1.223 u[IU]/mL (ref 0.350–4.500)

## 2018-08-28 LAB — HEPARIN LEVEL (UNFRACTIONATED)
Heparin Unfractionated: 0.17 IU/mL — ABNORMAL LOW (ref 0.30–0.70)
Heparin Unfractionated: 0.12 IU/mL — ABNORMAL LOW (ref 0.30–0.70)

## 2018-08-28 LAB — ECHOCARDIOGRAM COMPLETE
Height: 72 in
WEIGHTICAEL: 3347.46 [oz_av]

## 2018-08-28 LAB — PROCALCITONIN: PROCALCITONIN: 27.23 ng/mL

## 2018-08-28 LAB — MAGNESIUM: MAGNESIUM: 1.8 mg/dL (ref 1.7–2.4)

## 2018-08-28 LAB — HIV ANTIBODY (ROUTINE TESTING W REFLEX): HIV Screen 4th Generation wRfx: NONREACTIVE

## 2018-08-28 LAB — T4, FREE: FREE T4: 1.04 ng/dL (ref 0.82–1.77)

## 2018-08-28 LAB — PHOSPHORUS: PHOSPHORUS: 6.9 mg/dL — AB (ref 2.5–4.6)

## 2018-08-28 SURGERY — RIGHT HEART CATH
Anesthesia: LOCAL

## 2018-08-28 MED ORDER — SODIUM CHLORIDE 0.9 % IV SOLN
0.0000 mg/h | INTRAVENOUS | Status: DC
  Administered 2018-08-28: 1 mg/h via INTRAVENOUS
  Administered 2018-08-29: 4 mg/h via INTRAVENOUS
  Administered 2018-08-29: 5 mg/h via INTRAVENOUS
  Administered 2018-08-30: 4 mg/h via INTRAVENOUS
  Administered 2018-08-30: 5 mg/h via INTRAVENOUS
  Administered 2018-08-31: 4 mg/h via INTRAVENOUS
  Administered 2018-09-01: 2 mg/h via INTRAVENOUS
  Administered 2018-09-01: 5 mg/h via INTRAVENOUS
  Administered 2018-09-02: 3 mg/h via INTRAVENOUS
  Filled 2018-08-28 (×10): qty 10

## 2018-08-28 MED ORDER — SODIUM CHLORIDE 0.9 % IV SOLN
INTRAVENOUS | Status: DC
  Administered 2018-08-28 – 2018-09-01 (×2): via INTRAVENOUS

## 2018-08-28 MED ORDER — CALCIUM CHLORIDE 10 % IV SOLN
INTRAVENOUS | Status: AC
  Administered 2018-08-28: 1 g via INTRAVENOUS
  Filled 2018-08-28: qty 10

## 2018-08-28 MED ORDER — SODIUM BICARBONATE 8.4 % IV SOLN
50.0000 meq | Freq: Once | INTRAVENOUS | Status: AC
  Administered 2018-08-28: 50 meq via INTRAVENOUS

## 2018-08-28 MED ORDER — SODIUM BICARBONATE 8.4 % IV SOLN
INTRAVENOUS | Status: AC
  Filled 2018-08-28: qty 50

## 2018-08-28 MED ORDER — ALBUMIN HUMAN 5 % IV SOLN
12.5000 g | Freq: Once | INTRAVENOUS | Status: AC
  Administered 2018-08-28: 12.5 g via INTRAVENOUS

## 2018-08-28 MED ORDER — SODIUM BICARBONATE 8.4 % IV SOLN
INTRAVENOUS | Status: AC
  Administered 2018-08-28: 50 meq
  Filled 2018-08-28: qty 100

## 2018-08-28 MED ORDER — AMIODARONE HCL IN DEXTROSE 360-4.14 MG/200ML-% IV SOLN
30.0000 mg/h | INTRAVENOUS | Status: DC
  Administered 2018-08-28 – 2018-08-31 (×6): 30 mg/h via INTRAVENOUS
  Administered 2018-08-31 – 2018-09-02 (×8): 60 mg/h via INTRAVENOUS
  Administered 2018-09-02: 30 mg/h via INTRAVENOUS
  Administered 2018-09-02: 60 mg/h via INTRAVENOUS
  Administered 2018-09-03 – 2018-09-09 (×12): 30 mg/h via INTRAVENOUS
  Filled 2018-08-28 (×30): qty 200

## 2018-08-28 MED ORDER — AMIODARONE LOAD VIA INFUSION
150.0000 mg | Freq: Once | INTRAVENOUS | Status: AC
  Administered 2018-08-28: 150 mg via INTRAVENOUS

## 2018-08-28 MED ORDER — ALBUMIN HUMAN 5 % IV SOLN
INTRAVENOUS | Status: AC
  Administered 2018-08-28: 12.5 g via INTRAVENOUS
  Filled 2018-08-28: qty 250

## 2018-08-28 MED ORDER — CALCIUM CHLORIDE 10 % IV SOLN
2.0000 g | Freq: Once | INTRAVENOUS | Status: AC
  Administered 2018-08-28: 2 g via INTRAVENOUS

## 2018-08-28 MED ORDER — SODIUM CHLORIDE 0.9 % IV SOLN: 1.0000 g | Freq: Three times a day (TID) | INTRAVENOUS | Status: DC

## 2018-08-28 MED ORDER — SODIUM CHLORIDE 0.9 % IV BOLUS
1000.0000 mL | Freq: Once | INTRAVENOUS | Status: AC
  Administered 2018-08-28: 1000 mL via INTRAVENOUS

## 2018-08-28 MED ORDER — PHENYLEPHRINE HCL-NACL 40-0.9 MG/250ML-% IV SOLN
0.0000 ug/min | INTRAVENOUS | Status: DC
  Administered 2018-08-28: 350 ug/min via INTRAVENOUS
  Filled 2018-08-28: qty 250

## 2018-08-28 MED ORDER — CALCIUM CHLORIDE 10 % IV SOLN
1.0000 g | Freq: Once | INTRAVENOUS | Status: AC
  Administered 2018-08-28 (×3): 1 g via INTRAVENOUS

## 2018-08-28 MED ORDER — INSULIN ASPART 100 UNIT/ML ~~LOC~~ SOLN
0.0000 [IU] | SUBCUTANEOUS | Status: DC
  Administered 2018-08-29 (×2): 2 [IU] via SUBCUTANEOUS
  Administered 2018-08-29 (×2): 1 [IU] via SUBCUTANEOUS
  Administered 2018-08-29: 2 [IU] via SUBCUTANEOUS
  Administered 2018-08-29: 1 [IU] via SUBCUTANEOUS
  Administered 2018-08-29: 2 [IU] via SUBCUTANEOUS
  Administered 2018-08-30 – 2018-09-09 (×21): 1 [IU] via SUBCUTANEOUS
  Administered 2018-09-10 – 2018-09-11 (×2): 2 [IU] via SUBCUTANEOUS
  Administered 2018-09-11 – 2018-09-12 (×4): 1 [IU] via SUBCUTANEOUS

## 2018-08-28 MED ORDER — EPINEPHRINE PF 1 MG/ML IJ SOLN
0.5000 ug/min | INTRAVENOUS | Status: DC
  Administered 2018-08-28: 30 ug/min via INTRAVENOUS
  Administered 2018-08-28: 19 ug/min via INTRAVENOUS
  Administered 2018-08-28: 40 ug/min via INTRAVENOUS
  Administered 2018-08-28: 23 ug/min via INTRAVENOUS
  Administered 2018-08-28: 20 ug/min via INTRAVENOUS
  Administered 2018-08-28: 15 ug/min via INTRAVENOUS
  Administered 2018-08-29: 14 ug/min via INTRAVENOUS
  Filled 2018-08-28 (×9): qty 4

## 2018-08-28 MED ORDER — AMIODARONE HCL IN DEXTROSE 360-4.14 MG/200ML-% IV SOLN
INTRAVENOUS | Status: AC
  Filled 2018-08-28: qty 200

## 2018-08-28 MED ORDER — HYDROCORTISONE NA SUCCINATE PF 100 MG IJ SOLR
50.0000 mg | Freq: Four times a day (QID) | INTRAMUSCULAR | Status: DC
  Administered 2018-08-28 – 2018-09-02 (×21): 50 mg via INTRAVENOUS
  Filled 2018-08-28 (×21): qty 2

## 2018-08-28 MED ORDER — AMIODARONE HCL IN DEXTROSE 360-4.14 MG/200ML-% IV SOLN
30.0000 mg/h | INTRAVENOUS | Status: DC
  Administered 2018-08-28: 30 mg/h via INTRAVENOUS
  Filled 2018-08-28: qty 200

## 2018-08-28 MED ORDER — SODIUM CHLORIDE 0.9 % IV SOLN
2.0000 g | Freq: Two times a day (BID) | INTRAVENOUS | Status: DC
  Administered 2018-08-28 – 2018-08-31 (×7): 2 g via INTRAVENOUS
  Filled 2018-08-28 (×9): qty 2

## 2018-08-28 MED ORDER — SODIUM CHLORIDE 0.9 % IV SOLN
INTRAVENOUS | Status: DC
  Administered 2018-08-28 – 2018-09-10 (×4): via INTRAVENOUS

## 2018-08-28 MED ORDER — CALCIUM CHLORIDE 10 % IV SOLN
INTRAVENOUS | Status: AC
  Filled 2018-08-28: qty 10

## 2018-08-28 MED ORDER — VASOPRESSIN 20 UNIT/ML IV SOLN
0.0400 [IU]/min | INTRAVENOUS | Status: DC
  Administered 2018-08-28: 0.04 [IU]/min via INTRAVENOUS
  Administered 2018-08-28: 0.03 [IU]/min via INTRAVENOUS
  Administered 2018-08-29 – 2018-08-31 (×3): 0.04 [IU]/min via INTRAVENOUS
  Filled 2018-08-28 (×6): qty 2

## 2018-08-28 MED ORDER — ALBUMIN HUMAN 5 % IV SOLN
INTRAVENOUS | Status: AC
  Administered 2018-08-28: 12.5 g
  Filled 2018-08-28: qty 250

## 2018-08-28 MED ORDER — VANCOMYCIN VARIABLE DOSE PER UNSTABLE RENAL FUNCTION (PHARMACIST DOSING): Status: DC

## 2018-08-28 MED ORDER — SODIUM CHLORIDE 0.9 % IV SOLN
1.0000 g | Freq: Three times a day (TID) | INTRAVENOUS | Status: DC
  Filled 2018-08-28: qty 1

## 2018-08-28 MED ORDER — AMIODARONE LOAD VIA INFUSION
150.0000 mg | Freq: Once | INTRAVENOUS | Status: AC
  Administered 2018-08-28: 150 mg via INTRAVENOUS
  Filled 2018-08-28: qty 83.34

## 2018-08-28 MED ORDER — MIDAZOLAM HCL 2 MG/2ML IJ SOLN
2.0000 mg | INTRAMUSCULAR | Status: DC | PRN
  Administered 2018-08-28 – 2018-08-29 (×3): 2 mg via INTRAVENOUS
  Administered 2018-09-03: 1 mg via INTRAVENOUS
  Administered 2018-09-04 – 2018-09-13 (×22): 2 mg via INTRAVENOUS
  Filled 2018-08-28 (×29): qty 2

## 2018-08-28 MED ORDER — SODIUM BICARBONATE 8.4 % IV SOLN
100.0000 meq | Freq: Once | INTRAVENOUS | Status: AC
  Administered 2018-08-28: 100 meq via INTRAVENOUS

## 2018-08-28 MED ORDER — VANCOMYCIN HCL 10 G IV SOLR
1500.0000 mg | Freq: Once | INTRAVENOUS | Status: AC
  Administered 2018-08-28: 1500 mg via INTRAVENOUS
  Filled 2018-08-28: qty 1500

## 2018-08-28 MED ORDER — AMIODARONE HCL IN DEXTROSE 360-4.14 MG/200ML-% IV SOLN
60.0000 mg/h | INTRAVENOUS | Status: AC
  Administered 2018-08-28 (×2): 60 mg/h via INTRAVENOUS
  Filled 2018-08-28: qty 200

## 2018-08-28 MED ORDER — DEXTROSE 50 % IV SOLN
INTRAVENOUS | Status: AC
  Administered 2018-08-28: 25 mL
  Filled 2018-08-28: qty 50

## 2018-08-28 MED ORDER — PHENYLEPHRINE HCL-NACL 10-0.9 MG/250ML-% IV SOLN
0.0000 ug/min | INTRAVENOUS | Status: DC
  Administered 2018-08-28: 20 ug/min via INTRAVENOUS
  Filled 2018-08-28 (×3): qty 250

## 2018-08-28 MED ORDER — STERILE WATER FOR INJECTION IV SOLN
INTRAVENOUS | Status: DC
  Administered 2018-08-28: 10:00:00 via INTRAVENOUS
  Filled 2018-08-28 (×3): qty 850

## 2018-08-28 MED ORDER — VANCOMYCIN HCL IN DEXTROSE 750-5 MG/150ML-% IV SOLN
750.0000 mg | Freq: Two times a day (BID) | INTRAVENOUS | Status: DC
  Filled 2018-08-28: qty 150

## 2018-08-28 MED ORDER — MAGNESIUM SULFATE 2 GM/50ML IV SOLN
2.0000 g | Freq: Once | INTRAVENOUS | Status: AC
  Administered 2018-08-28: 2 g via INTRAVENOUS
  Filled 2018-08-28: qty 50

## 2018-08-28 MED FILL — Dextrose Inj 50%: INTRAVENOUS | Qty: 50 | Status: AC

## 2018-08-28 NOTE — Progress Notes (Signed)
  RHC numbers  RA 10 PA 46/33 (41) PCWP 10 Thermo CO/CI  15.2/7.0 Fick 14.1/6.5 (co-ox 86%) SVR 231  Swan numbers consistent with high-output failure. Suspect sepsis (versus distributive phase of cardiogenic shock). Continue broad spectrum abx, IVF and pressor support. Start bicarb.   Additional cct 35 mins.   Arvilla Meres, MD  10:03 AM

## 2018-08-28 NOTE — Consult Note (Addendum)
Advanced Heart Failure Team Consult Note   Primary Physician: Cody Patterson, No Pcp Per PCP-Cardiologist:  No primary care provider on file.  Reason for Consultation: Cardiogenic shock  HPI:    Cody Patterson is seen today for evaluation of Cardiogenic shock at the request of Dr. Gwenlyn Found.   Cody Patterson is a 42 y.o. 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   Presented to [facility] with increasing dyspnea.Reported last use of methamphetamines 08/17/18.  He acutely decompensated and became hypotensive with severe respiratory distress requiring intubation and pressor support 08/19/2018. Transferred to [facility] for management of his cardiogenic shock.    Pertinent labs on arrival to [facility] 08/20/2018 include Cr 2.21, K 4.9, WBC 22.4, Hgb 14.3, Lactic acid 3.1, and LFTs markedly elevated. ALT > 2000 and AST > 3000. Total Bili 5.3.  D-dimer 682 at [facility]. UDS + for amphetamines and THC. + MRSA by nasal PCR  Tmax 101.8. On Vanc and Aztreonam. Blood cultures sent.   R Subclavian Central line in place.   Initiated on milrinone on arrival to [facility].   This am Coox 85.8% on milrinone 0.375 mcg/kg/min, NE 50, and phenylephrine 460, and vasopressin 0.03. Lactic acid 5.5  Last ABG 7.45 / 35 / 144 / 2.7.  Intubated and sedated. Remains agitated at times per RN.  Stool coming up through OG tube.  SBPs in 100s  CVP 6-7 cm,(appears ~ 8-9 cm on exam)  Echocardiogram from [facility] 10/16: LV moderately dilated, severely impaired EF at 10%. Moderate RV dysfunction. Global hypokinesis. Moderately elevated RVSP.  ROS: Cody Patterson is encephalopathic and/or intubated. History has been obtained from chart review.    Home Medications Prior to Admission medications   Not on File   Past Medical History: Pt encephalopathic. Unable to obtain.    Past Surgical History: Pt encephalopathic. Unable to obtain.    Family History: Pt encephalopathic. Unable to obtain.    Social  History: Social History   Socioeconomic History  . Marital status: Legally Separated    Spouse name: Not on file  . Number of children: Not on file  . Years of education: Not on file  . Highest education level: Not on file  Occupational History  . Not on file  Social Needs  . Financial resource strain: Not on file  . Food insecurity:    Worry: Not on file    Inability: Not on file  . Transportation needs:    Medical: Not on file    Non-medical: Not on file  Tobacco Use  . Smoking status: Not on file  Substance and Sexual Activity  . Alcohol use: Not on file  . Drug use: Not on file  . Sexual activity: Not on file  Lifestyle  . Physical activity:    Days per week: Not on file    Minutes per session: Not on file  . Stress: Not on file  Relationships  . Social connections:    Talks on phone: Not on file    Gets together: Not on file    Attends religious service: Not on file    Active member of club or organization: Not on file    Attends meetings of clubs or organizations: Not on file    Relationship status: Not on file  Other Topics Concern  . Not on file  Social History Narrative  . Not on file    Allergies:  Allergies  Allergen Reactions  . Amoxicillin Swelling  .  Penicillins Swelling    Objective:    Vital Signs:   Temp:  [98 F (36.7 C)-101.8 F (38.8 C)] 100 F (37.8 C) (10/17 0645) Pulse Rate:  [103-128] 126 (10/17 0435) Resp:  [16-24] 16 (10/17 0645) BP: (81-122)/(54-74) 101/56 (10/17 0645) SpO2:  [96 %-100 %] 96 % (10/17 0645) Arterial Line BP: (78-104)/(39-69) 91/43 (10/17 0645) FiO2 (%):  [40 %] 40 % (10/17 0435) Weight:  [92 kg-94.9 kg] 94.9 kg (10/17 0500) Last BM Date: (PTA.)  Weight change: Filed Weights   08/17/2018 1845 09/03/2018 0500  Weight: 92 kg 94.9 kg    Intake/Output:   Intake/Output Summary (Last 24 hours) at 08/19/2018 0727 Last data filed at 09/07/2018 0640 Gross per 24 hour  Intake 1635.63 ml  Output 1685 ml  Net  -49.37 ml      Physical Exam    General: Intubated/sedated.  HEENT: + ETT. OG tube. Neck: Supple. JVP ~8-9 cm. Carotids 2+ bilat; no bruits. No thyromegaly or nodule noted. Cor: PMI nondisplaced. Tachy, Difficult to hear M/G/R due to tachy in 150s.  Lungs: Diminished basilar sounds + mechanical breathing sounds.  Abdomen: Non-tender, non-distended, no HSM. No bruits or masses. +BS  Extremities: No cyanosis, clubbing, or rash. Cool to the touch. Trace to 1+ edema.  Neuro: Intubated/sedated   Telemetry   ? Sinus tach in 150s, up from 120s earlier this am, personally reviewed  EKG    No tracing.   Labs   Basic Metabolic Panel: Recent Labs  Lab 08/24/2018 1851 09/11/2018 0440  NA 134* 135  K 4.9 5.1  CL 101 98  CO2 21* 21*  GLUCOSE 64* 55*  BUN 20 28*  CREATININE 2.21* 2.69*  CALCIUM 8.2* 7.8*  MG 2.1 1.8  PHOS 7.2* 6.9*    Liver Function Tests: Recent Labs  Lab 09/05/2018 1851  AST 3,397*  ALT 2,177*  ALKPHOS 114  BILITOT 5.3*  PROT 6.4*  ALBUMIN 3.0*   No results for input(s): LIPASE, AMYLASE in the last 168 hours. No results for input(s): AMMONIA in the last 168 hours.  CBC: Recent Labs  Lab 09/04/2018 1851 08/22/2018 0440  WBC 22.4* 22.9*  HGB 14.3 14.0  HCT 45.2 43.1  MCV 87.6 86.9  PLT 207 215    Cardiac Enzymes: No results for input(s): CKTOTAL, CKMB, CKMBINDEX, TROPONINI in the last 168 hours.  BNP: BNP (last 3 results) No results for input(s): BNP in the last 8760 hours.  ProBNP (last 3 results) No results for input(s): PROBNP in the last 8760 hours.   CBG: Recent Labs  Lab 09/11/2018 1732  GLUCAP 124*    Coagulation Studies: Recent Labs    08/17/2018 1851  LABPROT 27.3*  INR 2.58     Imaging   Dg Chest Port 1 View  Result Date: 08/23/2018 CLINICAL DATA:  Heart failure EXAM: PORTABLE CHEST 1 VIEW COMPARISON:  Chest x-ray 08/23/2018 at [facility]: Endotracheal tube tip is 2.4 cm above the carina. An orogastric  tube and side-port reaches the stomach. Right subclavian central line with tip at the upper cavoatrial junction. Cardiomegaly. Indistinct, patchy right-sided lung opacity. No Kerley lines, effusion, or pneumothorax. IMPRESSION: 1. Unremarkable hardware as described. 2. Cardiomegaly. 3. Asymmetric right-sided lung opacity could be asymmetric edema, aspiration, or pneumonia. Electronically Signed   By: Monte Fantasia M.D.   On: 09/08/2018 20:21      Medications:     Current Medications: . chlorhexidine gluconate (MEDLINE KIT)  15 mL Mouth Rinse BID  .  Chlorhexidine Gluconate Cloth  6 each Topical Q0600  . mouth rinse  15 mL Mouth Rinse 10 times per day  . mupirocin ointment  1 application Nasal BID     Infusions: . sodium chloride Stopped (09/04/2018 7425)  . aztreonam    . famotidine (PEPCID) IV Stopped (08/19/2018 2239)  . fentaNYL infusion INTRAVENOUS 400 mcg/hr (08/23/2018 0640)  . heparin 1,550 Units/hr (08/16/2018 0640)  . midazolam (VERSED) infusion 2 mg/hr (08/25/2018 0640)  . milrinone 0.375 mcg/kg/min (08/26/2018 0640)  . norepinephrine (LEVOPHED) Adult infusion 50 mcg/min (08/31/2018 0640)  . phenylephrine (NEO-SYNEPHRINE) Adult infusion 380 mcg/min (09/11/2018 0640)  . vancomycin 250 mL/hr at 08/12/2018 0640  . vancomycin    . vasopressin (PITRESSIN) infusion - *FOR SHOCK* 0.03 Units/min (08/26/2018 0640)       Cody Patterson Profile   Christie Copley is a 42 y.o. 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   Presented to [facility] 10/16 with worsening dyspnea. Became hypotensive and with respiratory distress requiring pressor support and intubation. Transferred to [facility] for treatment and further evaluation.   Assessment/Plan   1. Shock - ? Mixed component of cardiogenic and shock - Start Epi, and wean off phenylephrine in setting of cardiogenic component.  - Tmax 101.8. BCx sent. On empiric ABX.   2. Acute on chronic systolic CHF -  Echocardiogram from [facility] 10/16: LV moderately dilated, severely impaired EF at 10%. Moderate RV dysfunction. Global hypokinesis. Moderately elevated RVSP. - Bedside echo with RV collapse and massive LV.  - Coox 85.8 on milrinone 0.375 mcg/kg/min, NE 50, and phenylephrine 460, and vasopressin 0.03. Lactic acid 5.5 - Volume status not markedly elevated.  - May require mechanical support, though may not be stable for cath lab at this time. And component of biventricular failure complicates picture.  - Plan for swan this am.   - He is not candidate for VAD or transplant with active substance abuse.   3. Acute respiratory failure - Intubated and sedated. FiO2 40%. PEEP 5.  - CXR this am pending.   4. Polysubstance abuse - UDS + amphetamines and THC  5. Non-compliance  6. Hypoglycemia - Blood Sugar 60 this am. Dextrose ordered.  7. Transaminitis  - In setting of multifactorial shock. Will continue to follow.   8. Ileus  - Stool coming up through OG tube.  - KUB with large illeus.   Cody Patterson is critically ill and in multiple system organ failure.  Full plan to follow pending MD note.   Medication concerns reviewed with Cody Patterson and pharmacy team. Barriers identified: Non-compliance, substance abuse.   Length of Stay: 1  Annamaria Helling  09/04/2018, 7:27 AM  Advanced Heart Failure Team Pager [phone] (M-F; 7a - 4p)  Please contact [facility] for night-coverage after hours (4p -7a ) and weekends on [email or web address]  Cody Patterson seen and examined with the above-signed Advanced Practice Provider and/or Housestaff. I personally reviewed laboratory data, imaging studies and relevant notes. I independently examined the Cody Patterson and formulated the important aspects of the plan. I have edited the note to reflect any of my changes or salient points. I have personally discussed the plan with the Cody Patterson and/or family.  42 y/o male with h/o polysubstance abuse and severe NICM with EF ~20%  transferred from [facility] for further management of presumed cardiogenic shock.   Now on high-dose pressors with persistent hypotension, lactic acidosis and worsening MSOF. On admission, WBC 22k. Now with low grade temps.  CXR with ? RLL infiltrate  On exam intubated sedated JVP 7-8 Cor tachy regular Lungs CTA Ab soft NT Ext mottled   Echo done at bedside LV 20% RV mod HK.   He is critically ill with progressive MSOF. With high co-ox, elevated WBC and low CVP my major concern is for septic shock on top of poor underlying cardiac function. (versus distributive phase of cardiogenic shock) Will give IVF and bicarb. Expand abx. Follow cx. Place swan to further assess hemodynamics. If output low can consider mechanical support. D/w Dr. Vaughan Browner in [place].   CRITICAL CARE Performed by: Glori Bickers  Total critical care time: 60 minutes  Critical care time was exclusive of separately billable procedures and treating other patients.  Critical care was necessary to treat or prevent imminent or life-threatening deterioration.  Critical care was time spent personally by me (independent of midlevel providers or residents) on the following activities: development of treatment plan with Cody Patterson and/or surrogate as well as nursing, discussions with consultants, evaluation of Cody Patterson's response to treatment, examination of Cody Patterson, obtaining history from Cody Patterson or surrogate, ordering and performing treatments and interventions, ordering and review of laboratory studies, ordering and review of radiographic studies, pulse oximetry and re-evaluation of Cody Patterson's condition.   Glori Bickers, MD  9:50 AM

## 2018-08-28 NOTE — Progress Notes (Signed)
   Swan Numbers 1150 am  PA 52/39 (46)  PCWP unable to wedge CO 14.4 CI 6.65 CVP ~12-13 cm  Personally reviewed with Dr. Gala Romney.   Will stop milrinone.  Pt having transient VT. Will start amiodarone gtt with slow bolus (over 30 minutes to load)   [employer], PA-C 09/01/2018 12:02 PM

## 2018-08-28 NOTE — Progress Notes (Addendum)
Pharmacy Antibiotic Note  Cody Patterson is a 42 y.o. male admitted on 09/08/2018 with SOB/CHF requiring intubation, now with hypotension and sepsis. Lactic acid 3.1>4.8>5.5, Tmax 101.8, WBC 22.9. PCT 27. Pharmacy has been consulted for vancomycin and meropenem dosing.  Plan: Vancomycin 1500 mg IV x1 this morning. Given sudden increase in Scr from Cody Patterson (Scr 0.8)  to today, will plan to check level pending Scr improvement or decline.  Goal trough 10-15 mcg/mL.  Meropenem 2 g IV every 12 hours   Height: 6' (182.9 cm) Weight: 209 lb 3.5 oz (94.9 kg) IBW/kg (Calculated) : 77.6  Temp (24hrs), Avg:100.8 F (38.2 C), Min:98 F (36.7 C), Max:101.8 F (38.8 C)  Recent Labs  Lab 08/15/2018 1851 08/26/2018 1912 09/08/2018 2330 08/19/2018 0440 08/21/2018 0507  WBC 22.4*  --   --  22.9*  --   CREATININE 2.21*  --   --  2.69*  --   LATICACIDVEN  --  3.1* 4.8*  --  5.5*    Estimated Creatinine Clearance: 43.6 mL/min (A) (by C-G formula based on SCr of 2.69 mg/dL (H)).    Allergies  Allergen Reactions  . Amoxicillin Swelling  . Penicillins Swelling    Antimicrobials this admission: Vancomycin 10/17 >>  Meropenem 10/17 >>    Microbiology results: 10/17 BCx: 10/17 UCx:   10/17 Sputum Cx: 10/16 MRSA PCR: positive  Thank you for allowing pharmacy to be a part of this patient's care.  Cody Patterson, PharmD PGY2 Cardiology Pharmacy Resident Phone [phone] Please check AMION for all Pharmacist numbers by unit 08/19/2018 9:23 AM

## 2018-08-28 NOTE — Progress Notes (Signed)
  Echocardiogram 2D Echocardiogram has been performed.  Cody Patterson 08/20/2018, 3:22 PM

## 2018-08-28 NOTE — Progress Notes (Addendum)
PCCM Interval Progress Note  Called and asked to see pt at bedside due to refractory shock despite 40 mcg/min norepinehrpine and 0.03 units/min vasopressin. Last ABG 7.45 / 35 / 144 / 2.7. Last co-ox 81.  BP 90/41 (55).  Per RN, goal SBP > 95.  Phenylephrine infusion added by Pola Corn already. Will increase ceiling of norepi to 50 mcg/min. D/c propofol and switch to midazolam gtt instead. Repeat ABG and co-ox pending. Add CVP's q4hrs. Vanc and Aztreonam empirically already added by [facility] given temp of 102.  Cultures drawn.   Cody Patterson, [place] [facility] [place] [facility] Pager: [phone]  or [phone] 09/08/2018, 4:55 AM

## 2018-08-28 NOTE — Progress Notes (Signed)
RT note: upon arrival to patient room this AM, heart rate noted to be 150.  Will hold on daily SBT at this time.  Will continue to monitor.

## 2018-08-28 NOTE — Progress Notes (Signed)
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 Presented to [facility] with increasing dyspnea. Acute decompensation with hypotension and severe respiratory distress requiring intubation. Transferred to [facility] for management of advanced heart failure.   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 10/17- Worsening shock, started on levo, vaso,neo, milrinone  Consults: date of consult/date signed off & final recs:  Cardiology consultation pending  Procedures (surgical and bedside):  R subclavian CVC 10/16 RH L femoral arterial line 10/16 [facility]  Significant Diagnostic Tests:  Echocardiogram from [facility] 10/16: LV moderately dilated, severely impaired EF at 10%. Moderate RV dysfunction. Global hypokinesis. Moderately elevated RVSP.  Micro Data:  Bcx 10/17 -   Antimicrobials:  Vancomycin 10/17 > Aztreonam 10/17 >  Subjective:  Remains intubated, critically ill on multiple pressors and inotropes Started on antibiotics.  Objective   Blood pressure (!) 97/44, pulse (!) 151, temperature 99.9 F (37.7 C), resp. rate (!) 21, height 6' (1.829 m), weight 94.9 kg, SpO2 94 %. CVP:  [7 mmHg-11 mmHg] 7 mmHg  Vent Mode: PRVC FiO2 (%):  [40 %] 40 % Set Rate:  [16 bmp] 16 bmp Vt Set:  [620 mL] 620 mL PEEP:  [5 cmH20-10 cmH20] 5 cmH20 Plateau Pressure:  [15 cmH20] 15 cmH20   Intake/Output Summary (Last 24 hours) at 09/04/2018 4098 Last data filed at 09/09/2018 0640 Gross per 24 hour  Intake 1635.63 ml  Output 1685 ml  Net -49.37 ml   Filed Weights   08/31/2018 1845 08/15/2018 0500  Weight: 92 kg 94.9 kg    Examination: Gen:       No acute distress HEENT:  EOMI, sclera anicteric, ET tube Neck:     No masses; no thyromegaly Lungs:    Clear to auscultation bilaterally; normal respiratory effort CV:         Regular rate and rhythm; no murmurs Abd:      + bowel sounds; soft, non-tender; no palpable masses, no distension Ext:    No edema; adequate peripheral perfusion Skin:      Warm and dry; no rash Neuro: Sedated, unresponsive  Resolved Hospital Problem list   N/A  Assessment & Plan:  Severe shock secondary to decompensated nonischemic cardiomyopathy May have competent of sepsis  Shock- Cardiogenic, septic Continue pressors Wean off neo to improve afterload.  Start epinephrine if needed Broad antibiotic coverage Start stress dose steroids. Cardiology consult pending. Broad abx coverage.  Change aztreonam to meropenem.  Follow cultures. Stop IV heparin as suspicion for acute PE is low.  Renal failure Follow urine output and creatinine May end up needing CRRT  Acute respiratory failure secondary to pulmonary edema Continue vent support Follow ABG, chest x-ray  Disposition / Summary of Today's Plan 09/07/2018   Continue vent, inotrope support, antibiotics Supportive care. Follow cardiology consult.  Best Practice   Diet: N.p.o. given hemodynamic instability. Pain/Anxiety/Delirium protocol (if indicated): Fentanyl and propofol. VAP protocol (if indicated):  Bundle in place. DVT prophylaxis: DC systemic heparin, SCDs GI prophylaxis: Famotidine Hyperglycemia protocol: monitor for hypoglycemia Mobility: bedrest Code Status: full Family Communication: no contact with family since arrival here.  Labs personally reviewed.  CBC: reactive leukocytosis. Recent Labs  Lab 09/05/2018 1851 September 07, 2018 0440  WBC 22.4* 22.9*  HGB 14.3 14.0  HCT 45.2 43.1  MCV 87.6 86.9  PLT 207 215    Basic Metabolic Panel: pending Recent Labs  Lab 08/14/2018 1851 09-07-18 0440  NA 134* 135  K 4.9 5.1  CL 101 98  CO2  21* 21*  GLUCOSE 64* 55*  BUN 20 28*  CREATININE 2.21* 2.69*  CALCIUM 8.2* 7.8*  MG 2.1 1.8  PHOS 7.2* 6.9*   GFR: Estimated Creatinine Clearance: 43.6 mL/min (A) (by C-G formula based on SCr of 2.69 mg/dL (H)). Recent Labs  Lab 09/11/2018 1851 08/30/2018 1912 08/17/2018 2330 Sep 07, 2018 0440 September 07, 2018 0507  WBC 22.4*  --   --  22.9*  --   LATICACIDVEN  --  3.1* 4.8*  --  5.5*    Liver Function Tests: Recent Labs  Lab 08/25/2018 1851  AST 3,397*  ALT 2,177*  ALKPHOS 114  BILITOT 5.3*  PROT 6.4*  ALBUMIN 3.0*   No results for input(s): LIPASE, AMYLASE in the last 168 hours. No results for input(s): AMMONIA in the last 168 hours.  ABG    Component Value Date/Time   PHART 7.339 (L) 09-07-18 0445   PCO2ART 42.5 2018/09/07 0445   PO2ART 149 (H) 07-Sep-2018 0445   HCO3 21.8 09-07-18 0445   TCO2 24 09/10/2018 1855   ACIDBASEDEF 2.8 (H) 09/07/2018 0445   O2SAT 85.8 September 07, 2018 0445   O2SAT 98.5 2018-09-07 0445     Coagulation Profile: Recent Labs  Lab 09/09/2018 1851  INR 2.58    Cardiac Enzymes: No results for input(s): CKTOTAL, CKMB, CKMBINDEX, TROPONINI in the last 168 hours.  HbA1C: No results found for: HGBA1C  CBG: Recent Labs  Lab 08/26/2018 1732 09-07-2018 0746  GLUCAP 124* 60*   The patient is critically ill with multiple organ system failure and requires high complexity decision making for assessment and support, frequent evaluation and titration of therapies, advanced monitoring, review of radiographic studies and interpretation of complex data.   Critical Care Time devoted to patient care services, exclusive of separately billable procedures, described in this note is 35 minutes.   Chilton Greathouse MD [place] [facility] Pager [phone] If no answer or after 3pm call: [phone] 09/07/18, 8:14 AM

## 2018-08-28 NOTE — Progress Notes (Signed)
*  Preliminary Results* Bilateral lower extremity venous duplex completed. Visualized bilateral lower extremities are negative for deep vein thrombosis. There is no evidence of Baker's cyst bilaterally.  08/12/2018 11:32 AM Aundra Millet Clare Gandy

## 2018-08-28 NOTE — Progress Notes (Signed)
CRITICAL VALUE ALERT  Critical Value:  Lactic Acid 4.8  Date & Time Notied:  09/11/2018. 0038  Provider Notified:  Dr. Darrick Penna  Orders Received/Actions taken: Orders for ABG. See orders.

## 2018-08-28 NOTE — Progress Notes (Signed)
RT called to patient room due to patient having a decrease in sats. Lavaged patient and was able to obtain a copious amount of thick, pink-tinged, secretions.  Also increased FIO2 to 70% and sats increased to 97% and maintained.  Patient auscultated and noted that patient sounded coarse (was clear to auscultation this AM).  Sputum sample sent per MD order.  Obtained ABG on patient and increased respiratory rate from 16 to 24 per MD order.  MD aware.  Currently tolerating well.

## 2018-08-28 NOTE — Progress Notes (Signed)
CRITICAL VALUE ALERT  Critical Value:  Lactic Acid 8.7  Date & Time Notied:  2030- 10/17  Provider Notified: Cards and CCM notified  -RN notified CCM/ Cards regarding change in Pt rhythm at 1950- EKG performed- Amio bolus ordered per Dr. Milas Kocher. Cards fellow at bedside- Orders for repeat ABG and BMP.

## 2018-08-28 NOTE — Progress Notes (Signed)
PCCM interval note  Discussed with cardiology and Swan-Ganz placed Numbers indicate adequate cardiac output with low SVR suggestive of distributive shock Wedge pressure initially less than 10 but is rising with fluid bolus  Maxed out on epi, Levophed, vaso Avoid Neo-Synephrine and giapressa as he has severe cardiomyopathy Continue stress dose steroids, broad antibiotic coverage  Prognosis is grim.  We tried multiple times to call his contact phone but unable to reach anybody Remains full code  Additional CC time- 40 mins Chilton Greathouse MD [place] [facility] 09/26/18, 4:43 PM

## 2018-08-28 NOTE — Procedures (Signed)
Pulmonary Artery Catheter Insertion Procedure Note Yoseph Buchanan [id number] 04/19/1976  Procedure: Insertion of Pulmonary Artery Catheter  Indications: Guide hemodynamic management  Procedure Details Consent: Unable to obtain consent because of emergent medical necessity. Time Out: Verified patient identification, verified procedure, site/side was marked, verified correct patient position, special equipment/implants available, medications/allergies/relevent history reviewed, required imaging and test results available.  Performed  Description of Procedure Maximum sterile technique was used including antiseptics, cap, gloves, gown, hand hygiene, mask and sheet. Skin prep: Chlorhexidine; local anesthetic administered Pulmonary Artery Catheter was placed in the right internal jugular vein; introducer inserted over guidewire, initial position assessed by monitoring pressure waveform and catheter advanced after balloon inflation.  Evaluation Pressure waveform tracings: good, Pulmonary capillary wedge tracing: good, wedge tracing obtained after inflation of balloon with 1.0 - 1.25 cc. Complications: No apparent complications Patient did tolerate procedure well. Chest X-ray ordered to verify placement.  CXR: pending.  Koron Godeaux 08/20/2018

## 2018-08-28 NOTE — Progress Notes (Addendum)
Vent setting changed per MD order at 0230- PEEP changed to 5  @ 0415-RN notified Elink MD r/t agitation (Rass 2/3) and low BP- on A line. Blood pressure not correlating between A-line & cuff. Pt currently maxed out on pressors (Levo @ + Vaso).     Critical Lab value-Lactic Acid 5.5-Elink MD notified at [phone]

## 2018-08-28 NOTE — Progress Notes (Signed)
eLink Physician-Brief Progress Note Patient Name: Cody Patterson DOB: May 28, 1976 MRN: [id number]   Date of Service  09/03/2018  HPI/Events of Note  Persistent hypotension despite max NE gtts.  Placed on Vasopressin earlier with brief rally but once again hypotension.  Temp of 38.4 and rising lactate.  eICU Interventions  Plan: Repeat lactate Culture Broad spec ABX.  Has PCN allergy so will order Vanc/Azactam Add NEO gtt for BP support Hold on tylenol for temp due to shock liver PCCM to bedside to evaluate     Intervention Category Major Interventions: Shock - evaluation and management  Acasia Skilton 08/14/2018, 4:40 AM

## 2018-08-28 NOTE — Progress Notes (Signed)
Pharmacy Antibiotic Note  Cody Patterson is a 42 y.o. male admitted on 08/22/2018 with SOB/CHF requiring intubation, now with hypotension and possible sepsis.  Pharmacy has been consulted for Vancomycin and Aztreonam dosing.  Plan: Vancomycin 1500 mg IV now, then 750 mg IV q12h Aztreonam 1 g IV q8h  Height: 6' (182.9 cm) Weight: 202 lb 13.2 oz (92 kg) IBW/kg (Calculated) : 77.6  Temp (24hrs), Avg:101.1 F (38.4 C), Min:98 F (36.7 C), Max:101.8 F (38.8 C)  Recent Labs  Lab 09/06/2018 1851 09/04/2018 1912 08/17/2018 2330  WBC 22.4*  --   --   CREATININE 2.21*  --   --   LATICACIDVEN  --  3.1* 4.8*    Estimated Creatinine Clearance: 48.8 mL/min (A) (by C-G formula based on SCr of 2.21 mg/dL (H)).    Allergies  Allergen Reactions  . Amoxicillin Swelling  . Penicillins Swelling    Eddie Candle September 04, 2018 4:41 AM

## 2018-08-29 ENCOUNTER — Encounter (HOSPITAL_COMMUNITY): Payer: Self-pay | Admitting: *Deleted

## 2018-08-29 ENCOUNTER — Inpatient Hospital Stay (HOSPITAL_COMMUNITY): Payer: Medicaid Other

## 2018-08-29 LAB — BASIC METABOLIC PANEL
Anion gap: 14 (ref 5–15)
BUN: 41 mg/dL — ABNORMAL HIGH (ref 6–20)
CALCIUM: 6.7 mg/dL — AB (ref 8.9–10.3)
CHLORIDE: 97 mmol/L — AB (ref 98–111)
CO2: 20 mmol/L — ABNORMAL LOW (ref 22–32)
CREATININE: 3.67 mg/dL — AB (ref 0.61–1.24)
GFR, EST AFRICAN AMERICAN: 22 mL/min — AB (ref >60)
GFR, EST NON AFRICAN AMERICAN: 19 mL/min — AB (ref >60)
Glucose, Bld: 201 mg/dL — ABNORMAL HIGH (ref 70–99)
Potassium: 4.3 mmol/L (ref 3.5–5.1)
SODIUM: 131 mmol/L — AB (ref 135–145)

## 2018-08-29 LAB — BLOOD GAS, ARTERIAL
Acid-base deficit: 0.8 mmol/L (ref 0.0–2.0)
Bicarbonate: 23.2 mmol/L (ref 20.0–28.0)
FIO2: 60
MECHVT: 620 mL
O2 Saturation: 98.9 %
PCO2 ART: 38.6 mmHg (ref 32.0–48.0)
PEEP: 5 cmH2O
PH ART: 7.401 (ref 7.350–7.450)
PO2 ART: 152 mmHg — AB (ref 83.0–108.0)
Patient temperature: 100
RATE: 24 resp/min

## 2018-08-29 LAB — COOXEMETRY PANEL
CARBOXYHEMOGLOBIN: 1 % (ref 0.5–1.5)
Carboxyhemoglobin: 1 % (ref 0.5–1.5)
Carboxyhemoglobin: 1 % (ref 0.5–1.5)
METHEMOGLOBIN: 1.1 % (ref 0.0–1.5)
Methemoglobin: 1.7 % — ABNORMAL HIGH (ref 0.0–1.5)
Methemoglobin: 1.6 % — ABNORMAL HIGH (ref 0.0–1.5)
O2 SAT: 73.9 %
O2 Saturation: 46.5 %
O2 Saturation: 60.4 %
TOTAL HEMOGLOBIN: 12.4 g/dL (ref 12.0–16.0)
Total hemoglobin: 12.9 g/dL (ref 12.0–16.0)
Total hemoglobin: 13 g/dL (ref 12.0–16.0)

## 2018-08-29 LAB — HEPATIC FUNCTION PANEL
ALBUMIN: 2.8 g/dL — AB (ref 3.5–5.0)
ALT: 6915 U/L — ABNORMAL HIGH (ref 0–44)
AST: 8606 U/L — ABNORMAL HIGH (ref 15–41)
Alkaline Phosphatase: 135 U/L — ABNORMAL HIGH (ref 38–126)
Bilirubin, Direct: 3.7 mg/dL — ABNORMAL HIGH (ref 0.0–0.2)
Indirect Bilirubin: 3.2 mg/dL — ABNORMAL HIGH (ref 0.3–0.9)
TOTAL PROTEIN: 5.6 g/dL — AB (ref 6.5–8.1)
Total Bilirubin: 6.9 mg/dL — ABNORMAL HIGH (ref 0.3–1.2)

## 2018-08-29 LAB — GLUCOSE, CAPILLARY
GLUCOSE-CAPILLARY: 180 mg/dL — AB (ref 70–99)
GLUCOSE-CAPILLARY: 191 mg/dL — AB (ref 70–99)
Glucose-Capillary: 195 mg/dL — ABNORMAL HIGH (ref 70–99)
Glucose-Capillary: 160 mg/dL — ABNORMAL HIGH (ref 70–99)
Glucose-Capillary: 150 mg/dL — ABNORMAL HIGH (ref 70–99)
Glucose-Capillary: 150 mg/dL — ABNORMAL HIGH (ref 70–99)

## 2018-08-29 LAB — T3, FREE: T3 FREE: 1.4 pg/mL — AB (ref 2.0–4.4)

## 2018-08-29 LAB — CBC
HEMATOCRIT: 38.1 % — AB (ref 39.0–52.0)
Hemoglobin: 12.5 g/dL — ABNORMAL LOW (ref 13.0–17.0)
MCH: 28.1 pg (ref 26.0–34.0)
MCHC: 32.8 g/dL (ref 30.0–36.0)
MCV: 85.6 fL (ref 80.0–100.0)
Platelets: 193 10*3/uL (ref 150–400)
RBC: 4.45 MIL/uL (ref 4.22–5.81)
RDW: 16.3 % — ABNORMAL HIGH (ref 11.5–15.5)
WBC: 23.9 10*3/uL — ABNORMAL HIGH (ref 4.0–10.5)
nRBC: 0.3 % — ABNORMAL HIGH (ref 0.0–0.2)

## 2018-08-29 LAB — TROPONIN I: Troponin I: 1.12 ng/mL (ref <0.03)

## 2018-08-29 LAB — POCT I-STAT, CHEM 8
BUN: 51 mg/dL — AB (ref 6–20)
Calcium, Ion: 0.9 mmol/L — ABNORMAL LOW (ref 1.15–1.40)
Chloride: 93 mmol/L — ABNORMAL LOW (ref 98–111)
Creatinine, Ser: 3.6 mg/dL — ABNORMAL HIGH (ref 0.61–1.24)
Glucose, Bld: 147 mg/dL — ABNORMAL HIGH (ref 70–99)
HEMATOCRIT: 40 % (ref 39.0–52.0)
Hemoglobin: 13.6 g/dL (ref 13.0–17.0)
POTASSIUM: 4.2 mmol/L (ref 3.5–5.1)
SODIUM: 133 mmol/L — AB (ref 135–145)
TCO2: 26 mmol/L (ref 22–32)

## 2018-08-29 LAB — PHOSPHORUS: PHOSPHORUS: 5.6 mg/dL — AB (ref 2.5–4.6)

## 2018-08-29 LAB — MAGNESIUM: Magnesium: 1.8 mg/dL (ref 1.7–2.4)

## 2018-08-29 LAB — PROCALCITONIN: Procalcitonin: 22.85 ng/mL

## 2018-08-29 LAB — LACTIC ACID, PLASMA: Lactic Acid, Venous: 3.4 mmol/L (ref 0.5–1.9)

## 2018-08-29 MED ORDER — FAMOTIDINE IN NACL 20-0.9 MG/50ML-% IV SOLN
20.0000 mg | INTRAVENOUS | Status: DC
  Administered 2018-08-30 – 2018-09-09 (×11): 20 mg via INTRAVENOUS
  Filled 2018-08-29 (×11): qty 50

## 2018-08-29 MED ORDER — DEXMEDETOMIDINE HCL IN NACL 400 MCG/100ML IV SOLN
0.0000 ug/kg/h | INTRAVENOUS | Status: AC
  Administered 2018-08-29: 0.7 ug/kg/h via INTRAVENOUS
  Administered 2018-08-29: 0.4 ug/kg/h via INTRAVENOUS
  Administered 2018-08-29: 0.6 ug/kg/h via INTRAVENOUS
  Filled 2018-08-29 (×4): qty 100

## 2018-08-29 MED ORDER — FUROSEMIDE 10 MG/ML IJ SOLN
40.0000 mg | Freq: Every day | INTRAMUSCULAR | Status: DC
  Administered 2018-08-29: 40 mg via INTRAVENOUS
  Filled 2018-08-29: qty 4

## 2018-08-29 MED ORDER — VITAL HIGH PROTEIN PO LIQD
1000.0000 mL | ORAL | Status: DC
  Administered 2018-08-29 – 2018-09-02 (×4): 1000 mL
  Filled 2018-08-29 (×2): qty 1000

## 2018-08-29 MED ORDER — MILRINONE LACTATE IN DEXTROSE 20-5 MG/100ML-% IV SOLN
0.1250 ug/kg/min | INTRAVENOUS | Status: DC
  Administered 2018-08-29 – 2018-08-31 (×7): 0.5 ug/kg/min via INTRAVENOUS
  Administered 2018-08-31 – 2018-09-02 (×7): 0.375 ug/kg/min via INTRAVENOUS
  Administered 2018-09-03 – 2018-09-07 (×5): 0.125 ug/kg/min via INTRAVENOUS
  Filled 2018-08-29 (×21): qty 100

## 2018-08-29 MED ORDER — MAGNESIUM SULFATE 2 GM/50ML IV SOLN
2.0000 g | Freq: Once | INTRAVENOUS | Status: AC
  Administered 2018-08-29: 2 g via INTRAVENOUS
  Filled 2018-08-29: qty 50

## 2018-08-29 MED ORDER — ENOXAPARIN SODIUM 30 MG/0.3ML ~~LOC~~ SOLN
30.0000 mg | SUBCUTANEOUS | Status: DC
  Administered 2018-08-29: 30 mg via SUBCUTANEOUS
  Filled 2018-08-29 (×2): qty 0.3

## 2018-08-29 MED ORDER — MILRINONE LACTATE IN DEXTROSE 20-5 MG/100ML-% IV SOLN
0.2500 ug/kg/min | INTRAVENOUS | Status: DC
  Administered 2018-08-29: 0.125 ug/kg/min via INTRAVENOUS
  Filled 2018-08-29: qty 100

## 2018-08-29 NOTE — Progress Notes (Addendum)
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 Presented to [facility] with increasing dyspnea. Acute decompensation with hypotension and severe respiratory distress requiring intubation. Transferred to [facility] for management of sepsis, advanced heart failure.   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 10/17- Worsening shock, maxed on pressors, right heart cath indicative of distributive shock, high output failure 10/18-Started on milrinone, lasix for reduced cardiac output. PA numbers show worsening CO and high wedge  Consults: date of consult/date signed off & final recs:  10/17- Cardiology   Procedures (surgical and bedside):  10/16 [facility] R subclavian CVC  10/16 [facility] L femoral arterial line  10/17 PA catheter  Significant Diagnostic Tests:  Echocardiogram 10/17 Mild to moderate LV dilation with EF 20%, diffuse hypokinesis.   Restrictive diastolic function. Mildly dilated RV with mildly   decreased systolic function. No significant valvular   abnormalities  UDS [facility] 10/16- Amphetamines, THC  Swan ganz 10/18 Initial numbers RA 10 PA 46/33 (41) PCWP 10 Thermo CO/CI  15.2/7.0 Fick 14.1/6.5 (co-ox 86%) SVR 231  Micro Data:  Bcx 10/17  Trach aspirate 10/17 [facility], [facility]  Antimicrobials:  Vancomycin 10/17 > Meropenen 10/17 > Aztreonam 10/17  Subjective:  Remains critically ill.   Sedated, unresponsive on ventilator.  Pressors weaning down. Making some urine now with slight reduction in creatinine Started on milrinone by cardiology as cardiac output is low.  Objective   Blood pressure 115/81, pulse (!) 119, temperature (!) 101.7 F (38.7 C), resp. rate (!) 27, height 6' (1.829 m), weight 102.6 kg, SpO2 100 %. PAP: (38-57)/(17-42) 46/17 CVP:  [10 mmHg-26 mmHg] 16 mmHg PCWP:  [10 mmHg-24 mmHg] 24 mmHg CO:  [3.6 L/min-15.2 L/min] 3.6 L/min CI:  [1.7 L/min/m2-7 L/min/m2] 1.7 L/min/m2  Vent Mode: PRVC FiO2 (%):  [40 %-70 %] 40 % Set Rate:  [24 bmp] 24 bmp Vt Set:  [620 mL] 620 mL PEEP:  [5 cmH20] 5 cmH20 Plateau Pressure:  [21 cmH20-24 cmH20] 21 cmH20   Intake/Output Summary (Last 24 hours) at 08/29/2018 0805 Last data filed at 08/29/2018 0600 Gross per 24 hour  Intake 6469.89 ml  Output 1000 ml  Net 5469.89 ml   Filed Weights   08/23/2018 1845 09/08/2018 0500 08/29/18 0500  Weight: 92 kg 94.9 kg 102.6 kg   Examination: Gen:      No acute distress HEENT:  EOMI, sclera anicteric, ETT Neck:     No masses; no thyromegaly Lungs:    Clear to auscultation bilaterally; normal respiratory effort CV:         Regular rate and rhythm; no murmurs Abd:      + bowel sounds; soft, non-tender; no palpable masses, no distension Ext:    No edema; adequate peripheral perfusion Skin:      Warm and dry; no rash Neuro: Sedated, unresponsive.  Resolved Hospital Problem list     Assessment & Plan:  Severe shock secondary to sepsis in setting of decompensated nonischemic cardiomyopathy  Shock- Cardiogenic, septic Severe  cardiomyopathy, noncompliant V tach Continue weaning pressors Milrinone to augment cardiac output. Lasix 40 gm IV once Follow PA cath numbers Amiodarone gtt  Severe sepsis Broad antibiotic coverage, stress dose steroids Follow final cultures, procalcitonin.  Renal failure Making some urine now.  Follow urine output and creatinine  Acute  respiratory failure secondary to pulmonary edema Full vent support Follow chest x-ray, ABG.  Elevated LFTs, INR secondary to shock liver Monitor labs RUQ [place]  Disposition / Summary of Today's Plan 08/29/18   Continue vent,antibiotics PA cath numbers today indicates more cardiogenic shock.  Sepsis appears to be resolving with high SVR today Start milrinone, Lasix 40 mg once. Supportive care.  Best Practice   Diet: NPO given hemodynamic instability. Pain/Anxiety/Delirium protocol (if indicated): Fentanyl, versed, precedex VAP protocol (if indicated):  Bundle in place. DVT prophylaxis: Lovenox GI prophylaxis: Famotidine Hyperglycemia protocol: monitor for hypoglycemia Mobility: bedrest Code Status: full Family Communication: no contact with family since arrival here.  Tried multiple times to reach the contact number no answer.  Labs personally reviewed.  CBC: reactive leukocytosis. Recent Labs  Lab 09/03/2018 1851 08/18/2018 0440 08/12/2018 1047 08/31/2018 1213 09/04/2018 1718 08/29/18 0330  WBC 22.4* 22.9*  --   --   --  23.9*  HGB 14.3 14.0 12.9* 12.9* 11.2* 12.5*  HCT 45.2 43.1 38.0* 38.0* 33.0* 38.1*  MCV 87.6 86.9  --   --   --  85.6  PLT 207 215  --   --   --  193    Basic Metabolic Panel: pending Recent Labs  Lab 09/03/2018 1851 09/06/2018 0440 08/26/2018 1047 09/09/2018 1213 08/18/2018 1718 09/07/2018 2027 08/29/18 0330  NA 134* 135 135 134* 136  137 133* 131*  K 4.9 5.1 5.1 4.4 5.4*  4.6 5.1 4.3  CL 101 98 100 104 94*  97* 93* 97*  CO2 21* 21*  --   --  19* 19* 20*  GLUCOSE 64* 55* 99 100* 190*  167* 217* 201*  BUN 20 28* 35* 32* 35*  36* 38* 41*  CREATININE 2.21* 2.69* 3.50* 3.20* 3.90*  3.40* 3.97* 3.67*  CALCIUM 8.2* 7.8*  --   --  6.8* 7.9* 6.7*  MG 2.1 1.8  --   --   --   --  1.8  PHOS 7.2* 6.9*  --   --   --   --  5.6*   GFR: Estimated Creatinine Clearance: 33.2 mL/min (A) (by C-G formula based on SCr of 3.67 mg/dL (H)). Recent Labs  Lab 09/11/2018 1851   09/09/2018 2330 08/16/2018 0440 08/14/2018 0507 08/16/2018 0830 08/30/2018 1145 08/23/2018 1849 08/29/18 0330  PROCALCITON  --   --   --   --   --  27.23  --   --  22.85  WBC 22.4*  --   --  22.9*  --   --   --   --  23.9*  LATICACIDVEN  --    < > 4.8*  --  5.5*  --  7.2* 8.7*  --    < > = values in this interval not displayed.    Liver Function Tests: Recent Labs  Lab 09/03/2018 1851 08/21/2018 1718  AST 3,397* 11,847*  ALT 2,177* 6,537*  ALKPHOS 114 112  BILITOT 5.3* 5.7*  PROT 6.4* 5.4*  ALBUMIN 3.0* 2.8*   No results for input(s): LIPASE, AMYLASE in the last 168 hours. No results for input(s): AMMONIA in the last 168 hours.  ABG    Component Value Date/Time  PHART 7.401 08/29/2018 0340   PCO2ART 38.6 08/29/2018 0340   PO2ART 152 (H) 08/29/2018 0340   HCO3 23.2 08/29/2018 0340   TCO2 23 08/23/2018 2035   ACIDBASEDEF 0.8 08/29/2018 0340   O2SAT 73.9 08/29/2018 0340   O2SAT 98.9 08/29/2018 0340     Coagulation Profile: Recent Labs  Lab Sep 23, 2018 1851  INR 2.58    Cardiac Enzymes: No results for input(s): CKTOTAL, CKMB, CKMBINDEX, TROPONINI in the last 168 hours.  HbA1C: No results found for: HGBA1C  CBG: Recent Labs  Lab 08/25/2018 1632 08/24/2018 2126 08/29/18 0007 08/29/18 0326 08/29/18 0747  GLUCAP 109* 213* 191* 195* 180*   The patient is critically ill with multiple organ system failure and requires high complexity decision making for assessment and support, frequent evaluation and titration of therapies, advanced monitoring, review of radiographic studies and interpretation of complex data.   Critical Care Time devoted to patient care services, exclusive of separately billable procedures, described in this note is 35 minutes.   Chilton Greathouse MD [facility] Pager [phone] If no answer or after 3pm call: [phone] 08/29/2018, 8:14 AM

## 2018-08-29 NOTE — Progress Notes (Signed)
Patient's girlfriend at bedside, updated on pt condition.

## 2018-08-29 NOTE — Plan of Care (Signed)
Care plan reviewed.

## 2018-08-29 NOTE — Progress Notes (Addendum)
Advanced Heart Failure Rounding Note  PCP-Cardiologist: No primary care provider on file.   Subjective:    Coox 73.9% this am on NE 44, Epi 8, and vasopressin 0.04. Remains on amio gtt  Cr peaked at 3.97, and now 3.67 this am. WBC 23.9.  Tmax 101.7.  Remains intubated and sedated. Overnight, cardiac output has trended down. Cr peaked. MAPs have improved and have been able to decreased pressors significantly.   CXR this am with formal read pending. Not much improved.   Swan numbers [id number] 08/29/18, Done personally at bedside PAP 48/17 PCWP 20 (Could not wedge, so used PA diastolic).  Was 24 at 1924 CO 3.61 CI 1.66 CVP 15 cm SVR 1728 PVR 199 Cardiac Power 0.72 PAPI 2.07   Objective:   Weight Range: 102.6 kg Body mass index is 30.68 kg/m.   Vital Signs:   Temp:  [98.6 F (37 C)-101.7 F (38.7 C)] 101.7 F (38.7 C) (10/18 0700) Pulse Rate:  [126-151] 137 (10/18 0405) Resp:  [16-29] 26 (10/18 0700) BP: (80-142)/(42-100) 123/94 (10/18 0700) SpO2:  [93 %-100 %] 100 % (10/18 0700) Arterial Line BP: (72-139)/(34-102) 116/82 (10/18 0700) FiO2 (%):  [40 %-70 %] 50 % (10/18 0405) Weight:  [102.6 kg] 102.6 kg (10/18 0500) Last BM Date: 08/31/2018  Weight change: Filed Weights   08/25/2018 1845 09/04/2018 0500 08/29/18 0500  Weight: 92 kg 94.9 kg 102.6 kg    Intake/Output:   Intake/Output Summary (Last 24 hours) at 08/29/2018 0728 Last data filed at 08/29/2018 0600 Gross per 24 hour  Intake 6469.89 ml  Output 1200 ml  Net 5269.89 ml      Physical Exam   General: Intubated/sedated.  HEENT: + ETT, +Mechanical breathing sounds  Neck: Supple. JVP elevated. Carotids 2+ bilat; no bruits. No thyromegaly or nodule noted. Cor: PMI nondisplaced. RRR, ?S3.  Lungs: Diminished basilar sounds Abdomen: Soft, non-tender, non-distended, no HSM. No bruits or masses. +BS  Extremities: No cyanosis, clubbing, or rash. Trace to 1+ ankle edema. Cool to the touch.  Neuro:  Intubated/sedated   Telemetry   Sinus tach 110-120s, Runs of VT throughout the night, personally reviewed.   EKG    No new tracings.    Labs    CBC Recent Labs    08/31/2018 0440  08/31/2018 1718 08/29/18 0330  WBC 22.9*  --   --  23.9*  HGB 14.0   < > 11.2* 12.5*  HCT 43.1   < > 33.0* 38.1*  MCV 86.9  --   --  85.6  PLT 215  --   --  193   < > = values in this interval not displayed.   Basic Metabolic Panel Recent Labs    08/21/2018 0440  09/01/2018 2027 08/29/18 0330  NA 135   < > 133* 131*  K 5.1   < > 5.1 4.3  CL 98   < > 93* 97*  CO2 21*   < > 19* 20*  GLUCOSE 55*   < > 217* 201*  BUN 28*   < > 38* 41*  CREATININE 2.69*   < > 3.97* 3.67*  CALCIUM 7.8*   < > 7.9* 6.7*  MG 1.8  --   --  1.8  PHOS 6.9*  --   --  5.6*   < > = values in this interval not displayed.   Liver Function Tests Recent Labs    08/23/2018 1851 08/18/2018 1718  AST 3,397* 11,847*  ALT 2,177* 6,537*  ALKPHOS 114 112  BILITOT 5.3* 5.7*  PROT 6.4* 5.4*  ALBUMIN 3.0* 2.8*   No results for input(s): LIPASE, AMYLASE in the last 72 hours. Cardiac Enzymes No results for input(s): CKTOTAL, CKMB, CKMBINDEX, TROPONINI in the last 72 hours.  BNP: BNP (last 3 results) No results for input(s): BNP in the last 8760 hours.  ProBNP (last 3 results) No results for input(s): PROBNP in the last 8760 hours.   D-Dimer No results for input(s): DDIMER in the last 72 hours. Hemoglobin A1C No results for input(s): HGBA1C in the last 72 hours. Fasting Lipid Panel Recent Labs    09/04/2018 1851  TRIG 47   Thyroid Function Tests Recent Labs    09/07/2018 1145  TSH 1.223  T3FREE 1.4*    Other results:   Imaging    Dg Abd 1 View  Result Date: 09/07/2018 CLINICAL DATA:  Abdominal distention. EXAM: ABDOMEN - 1 VIEW COMPARISON:  None. FINDINGS: NG tube is in the mid stomach. Moderate gas and stool within the colon. No evidence of bowel obstruction. No organomegaly or visible free air. IMPRESSION: NG  tube tip in the mid stomach. Moderate gas and stool within the colon. Electronically Signed   By: Rolm Baptise M.D.   On: 08/19/2018 08:48   Dg Chest Port 1 View  Result Date: 09/01/2018 CLINICAL DATA:  Recent hypoxia EXAM: PORTABLE CHEST 1 VIEW COMPARISON:  10/17/9 FINDINGS: Cardiac shadow remains enlarged. Endotracheal tube and nasogastric catheter are stable. Swan-Ganz catheter and right subclavian catheter are stable as well. Lungs are well aerated bilaterally. Patchy left retrocardiac density is noted. Stable central vascular congestion is noted without significant interstitial edema. No pneumothorax is noted. IMPRESSION: Stable vascular congestion and left basilar infiltrate. No new focal abnormality is seen. Electronically Signed   By: Inez Catalina M.D.   On: 09/06/2018 11:31   Dg Chest Port 1 View  Result Date: 08/18/2018 CLINICAL DATA:  Swan-Ganz placement EXAM: PORTABLE CHEST 1 VIEW COMPARISON:  09/10/2018 FINDINGS: Interval placement of Swan-Ganz catheter with tip in the central right pulmonary artery. No pneumothorax. Endotracheal tube, right central line and NG tube are unchanged. Cardiomegaly with vascular congestion and bilateral lower lobe airspace opacities, left greater than right. Slight improvement in the right lung base airspace opacity. IMPRESSION: Interval placement of right Swan-Ganz catheter with the tip in the central right pulmonary artery. No pneumothorax. Cardiomegaly with vascular congestion and bilateral lower lobe airspace opacities, slightly improved on the right. Electronically Signed   By: Rolm Baptise M.D.   On: 09/03/2018 10:01      Medications:     Scheduled Medications: . chlorhexidine gluconate (MEDLINE KIT)  15 mL Mouth Rinse BID  . Chlorhexidine Gluconate Cloth  6 each Topical Q0600  . hydrocortisone sod succinate (SOLU-CORTEF) inj  50 mg Intravenous Q6H  . insulin aspart  0-9 Units Subcutaneous Q4H  . mouth rinse  15 mL Mouth Rinse 10 times per day    . mupirocin ointment  1 application Nasal BID  . vancomycin variable dose per unstable renal function (pharmacist dosing)   Does not apply See admin instructions     Infusions: . sodium chloride Stopped (09/11/2018 3810)  . sodium chloride 10 mL/hr at 08/29/18 0600  . sodium chloride 10 mL/hr at 08/29/18 0600  . amiodarone 30 mg/hr (08/29/18 1751)  . dexmedetomidine (PRECEDEX) IV infusion 0.8 mcg/kg/hr (08/29/18 0600)  . epinephrine 10 mcg/min (08/29/18 0600)  . famotidine (PEPCID) IV Stopped (08/19/2018 2351)  . fentaNYL infusion INTRAVENOUS  400 mcg/hr (08/29/18 0600)  . meropenem (MERREM) IV Stopped (09/03/2018 2228)  . midazolam (VERSED) infusion 5 mg/hr (08/29/18 0600)  . norepinephrine (LEVOPHED) Adult infusion 45 mcg/min (08/29/18 0717)  . vasopressin (PITRESSIN) infusion - *FOR SHOCK* 0.04 Units/min (08/29/18 0600)     PRN Medications:  midazolam    Patient Profile   Cody Patterson is a 42 y.o. 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   Presented to [facility] 10/16 with worsening dyspnea. Became hypotensive and with respiratory distress requiring pressor support and intubation. Transferred to [facility] for treatment and further evaluation.   Assessment/Plan   1. Shock - Tmax 101.7. BCx pending.  - Suspect primarily septic shock.  - Trach aspirate with Mod Gram Neg Coccobacilli with rare gram + Cocci - Continue on vanc and meropenem  2. Acute on chronic systolic CHF - Echocardiogram from [facility] 10/16: LV moderately dilated, severely impaired EF at 10%. Moderate RV dysfunction. Global hypokinesis. Moderately elevated RVSP. - Bedside echo with RV collapse and massive LV.  - Coox 73.9% (0340) on NE 44, Epi 8, and vasopressin 0.04. Lactic acid up to 8.7. - With low cardiac output by swan, will start milrinone at 0.125 mcg/kg/min and follow Coox/Swan numbers.  - Volume status elevated with CVP 14-15. May need to give dose of lasix this  am and follow response.  Luiz Blare numbers have been consistent with high output cardiac function in setting of septic shock.  - He is not candidate for VAD or transplant with active substance abuse.   3. Acute respiratory failure - Remains intubated and sedated.  - FiO2 40% and PEEP 5.  - CXR this am pending, but looks similar to yesterday. ? Edema vs infiltrate.   4. Polysubstance abuse - UDS + amphetamines and THC - No change.   5. Non-compliance  6. Hypoglycemia - Follow.   7. Transaminitis  - In setting of multifactorial shock. Continue to follow.   8. Ileus  - KUB 08/22/2018 with large illeus in the setting of stool coming through OG tube.  - Improved.  Medication concerns reviewed with patient and pharmacy team. Barriers identified: Non-compliance, substance abuse.   Length of Stay: 2  Annamaria Helling  08/29/2018, 7:28 AM  Advanced Heart Failure Team Pager [phone] (M-F; 7a - 4p)  Please contact [facility] for night-coverage after hours (4p -7a ) and weekends on [email or web address]  Agree with above  He has survived 24 hours of profound septic shock and was able to mount a significant cardiac response despite biventricular failure. Hemodynamics no show that he is transitioning to a more cardiogenic picture. SVR 1700. CO 3-4L/min. Remains on epi and NE though rates coming down. Making urine.   Inutbated/sedated RIJ swan JVP 15 Cor tachy regular Lungs + rhonchi Ab soft NT Extremities warm  Will need ongoing treatment for sepsis with broad spectrum abx and pressor support. Will now restart milrinone carefully) for HF support. Give lasix 32m IV. Watch closely. D/w CCM at bedside. Await lactic acid from this am. Follow swan numbers and co-ox. Continue amio for possible atrial flutter/atrial tach for now.   CRITICAL CARE Performed by: BGlori Bickers Total critical care time: 45 minutes  Critical care time was exclusive of separately billable  procedures and treating other patients.  Critical care was necessary to treat or prevent imminent or life-threatening deterioration.  Critical care was time spent personally by me (independent of midlevel providers or residents) on  the following activities: development of treatment plan with patient and/or surrogate as well as nursing, discussions with consultants, evaluation of patient's response to treatment, examination of patient, obtaining history from patient or surrogate, ordering and performing treatments and interventions, ordering and review of laboratory studies, ordering and review of radiographic studies, pulse oximetry and re-evaluation of patient's condition.  Glori Bickers, MD  8:47 AM

## 2018-08-29 NOTE — Progress Notes (Signed)
Initial Nutrition Assessment  DOCUMENTATION CODES:   Obesity unspecified  INTERVENTION:    Initiate Vital High Protein at trickle rate of 20 ml/hr   Recommend goal rate 35 ml/hr (840 ml per day) and Prostat 60 ml TID   Provides 1440 kcals, 163 gm protein, 702 ml of free water daily  NUTRITION DIAGNOSIS:   Inadequate oral intake related to inability to eat as evidenced by NPO status  GOAL:   Provide needs based on ASPEN/SCCM guidelines  MONITOR:   Vent status, Labs, Skin, Weight trends, I & O's  REASON FOR ASSESSMENT:   Consult, Low Braden Assessment of nutrition requirement/status  ASSESSMENT:   42 yo Male with PMH 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 Presented to [facility] with increasing dyspnea. Acute decompensation with hypotension and severe respiratory distress requiring intubation. Transferred to [facility].  Patient is currently intubated on ventilator support Temp (24hrs), Avg:100.4 F (38 C), Min:98.6 F (37 C), Max:102.4 F (39.1 C)  OGT in place  RD unable to obtain nutrition hx at this time. Pt with severe cardiogenic and septic shock. Polysubstance abuse. UDS + amphetamines/THC.  Low braden score places pt at risk for skin breakdown. Labs & medications reviewed. Na 131 (L). P 5.6 (H). CBG's [id number].  Verbal with Read Back ordered received per Dr. Isaiah Serge to start TF. Initiate at trickle rate. No advancement at this time. KUB 10/17 showed large ileus.   NUTRITION - FOCUSED PHYSICAL EXAM:  Deferred at this time.  Diet Order:   Diet Order            Diet NPO time specified  Diet effective now             EDUCATION NEEDS:   Not appropriate for education at this time  Skin:  Skin Assessment: Reviewed RN Assessment  Last BM:  10/17   Intake/Output Summary (Last 24 hours) at 08/29/2018 1128 Last data filed at 08/29/2018 1100 Gross per 24 hour  Intake 7423.59 ml  Output 1800 ml   Net 5623.59 ml   Height:   Ht Readings from Last 1 Encounters:  08/23/2018 6' (1.829 m)   Weight:   Wt Readings from Last 1 Encounters:  08/29/18 102.6 kg   Ideal Body Weight:  81 kg  BMI:  Body mass index is 30.68 kg/m.  Estimated Nutritional Needs:   Kcal:  9323-5573  Protein:  >/= 162 gm  Fluid:  per MD  Maureen Chatters, RD, LDN Pager #: [phone] After-Hours Pager #: [phone]

## 2018-08-30 ENCOUNTER — Inpatient Hospital Stay (HOSPITAL_COMMUNITY): Payer: Medicaid Other

## 2018-08-30 LAB — CBC
HEMATOCRIT: 38.5 % — AB (ref 39.0–52.0)
Hemoglobin: 12.6 g/dL — ABNORMAL LOW (ref 13.0–17.0)
MCH: 27.5 pg (ref 26.0–34.0)
MCHC: 32.7 g/dL (ref 30.0–36.0)
MCV: 84.1 fL (ref 80.0–100.0)
NRBC: 0.8 % — AB (ref 0.0–0.2)
Platelets: 160 10*3/uL (ref 150–400)
RBC: 4.58 MIL/uL (ref 4.22–5.81)
RDW: 16.5 % — AB (ref 11.5–15.5)
WBC: 20 10*3/uL — AB (ref 4.0–10.5)

## 2018-08-30 LAB — COMPREHENSIVE METABOLIC PANEL
ALK PHOS: 127 U/L — AB (ref 38–126)
ALT: 5164 U/L — AB (ref 0–44)
AST: 3308 U/L — ABNORMAL HIGH (ref 15–41)
Albumin: 2.6 g/dL — ABNORMAL LOW (ref 3.5–5.0)
Anion gap: 14 (ref 5–15)
BUN: 59 mg/dL — ABNORMAL HIGH (ref 6–20)
CALCIUM: 7.5 mg/dL — AB (ref 8.9–10.3)
CHLORIDE: 95 mmol/L — AB (ref 98–111)
CO2: 24 mmol/L (ref 22–32)
CREATININE: 3.09 mg/dL — AB (ref 0.61–1.24)
GFR calc non Af Amer: 24 mL/min — ABNORMAL LOW (ref >60)
GFR, EST AFRICAN AMERICAN: 27 mL/min — AB (ref >60)
Glucose, Bld: 147 mg/dL — ABNORMAL HIGH (ref 70–99)
Potassium: 4.1 mmol/L (ref 3.5–5.1)
Sodium: 133 mmol/L — ABNORMAL LOW (ref 135–145)
Total Bilirubin: 7.7 mg/dL — ABNORMAL HIGH (ref 0.3–1.2)
Total Protein: 5.5 g/dL — ABNORMAL LOW (ref 6.5–8.1)

## 2018-08-30 LAB — POCT I-STAT 3, ART BLOOD GAS (G3+)
ACID-BASE EXCESS: 5 mmol/L — AB (ref 0.0–2.0)
Acid-Base Excess: 4 mmol/L — ABNORMAL HIGH (ref 0.0–2.0)
BICARBONATE: 25.7 mmol/L (ref 20.0–28.0)
BICARBONATE: 28.9 mmol/L — AB (ref 20.0–28.0)
O2 SAT: 100 %
O2 SAT: 99 %
PO2 ART: 146 mmHg — AB (ref 83.0–108.0)
Patient temperature: 100.6
Patient temperature: 36.8
TCO2: 27 mmol/L (ref 22–32)
TCO2: 30 mmol/L (ref 22–32)
pCO2 arterial: 31.5 mmHg — ABNORMAL LOW (ref 32.0–48.0)
pCO2 arterial: 38 mmHg (ref 32.0–48.0)
pH, Arterial: 7.524 — ABNORMAL HIGH (ref 7.350–7.450)
pH, Arterial: 7.488 — ABNORMAL HIGH (ref 7.350–7.450)
pO2, Arterial: 168 mmHg — ABNORMAL HIGH (ref 83.0–108.0)

## 2018-08-30 LAB — URINE CULTURE: CULTURE: NO GROWTH

## 2018-08-30 LAB — GLUCOSE, CAPILLARY
GLUCOSE-CAPILLARY: 139 mg/dL — AB (ref 70–99)
GLUCOSE-CAPILLARY: 100 mg/dL — AB (ref 70–99)
GLUCOSE-CAPILLARY: 138 mg/dL — AB (ref 70–99)
Glucose-Capillary: 133 mg/dL — ABNORMAL HIGH (ref 70–99)

## 2018-08-30 LAB — CULTURE, RESPIRATORY W GRAM STAIN

## 2018-08-30 LAB — COOXEMETRY PANEL
CARBOXYHEMOGLOBIN: 1.1 % (ref 0.5–1.5)
Methemoglobin: 1.6 % — ABNORMAL HIGH (ref 0.0–1.5)
O2 Saturation: 67.5 %
Total hemoglobin: 13.4 g/dL (ref 12.0–16.0)

## 2018-08-30 LAB — LACTIC ACID, PLASMA: Lactic Acid, Venous: 3.1 mmol/L (ref 0.5–1.9)

## 2018-08-30 LAB — PROTIME-INR
INR: 3.4
Prothrombin Time: 33.9 seconds — ABNORMAL HIGH (ref 11.4–15.2)

## 2018-08-30 LAB — PHOSPHORUS: Phosphorus: 4.6 mg/dL (ref 2.5–4.6)

## 2018-08-30 LAB — BILIRUBIN, FRACTIONATED(TOT/DIR/INDIR)
Bilirubin, Direct: 4.2 mg/dL — ABNORMAL HIGH (ref 0.0–0.2)
Indirect Bilirubin: 3.7 mg/dL — ABNORMAL HIGH (ref 0.3–0.9)
Total Bilirubin: 7.9 mg/dL — ABNORMAL HIGH (ref 0.3–1.2)

## 2018-08-30 LAB — CULTURE, RESPIRATORY

## 2018-08-30 LAB — MAGNESIUM: MAGNESIUM: 2.2 mg/dL (ref 1.7–2.4)

## 2018-08-30 LAB — PROCALCITONIN: Procalcitonin: 14.68 ng/mL

## 2018-08-30 LAB — VANCOMYCIN, RANDOM: VANCOMYCIN RM: 5

## 2018-08-30 LAB — TROPONIN I: TROPONIN I: 0.65 ng/mL — AB (ref <0.03)

## 2018-08-30 MED ORDER — SODIUM CHLORIDE 0.9% FLUSH
10.0000 mL | Freq: Two times a day (BID) | INTRAVENOUS | Status: DC
  Administered 2018-08-30 – 2018-09-11 (×17): 10 mL

## 2018-08-30 MED ORDER — CHLORHEXIDINE GLUCONATE CLOTH 2 % EX PADS
6.0000 | MEDICATED_PAD | Freq: Every day | CUTANEOUS | Status: DC
  Administered 2018-08-30 – 2018-09-07 (×8): 6 via TOPICAL

## 2018-08-30 MED ORDER — VANCOMYCIN HCL 10 G IV SOLR
1250.0000 mg | INTRAVENOUS | Status: DC
  Filled 2018-08-30: qty 1250

## 2018-08-30 MED ORDER — CHLORHEXIDINE GLUCONATE 0.12% ORAL RINSE (MEDLINE KIT)
15.0000 mL | Freq: Two times a day (BID) | OROMUCOSAL | Status: DC
  Administered 2018-08-30 – 2018-09-14 (×31): 15 mL via OROMUCOSAL

## 2018-08-30 MED ORDER — SODIUM CHLORIDE 0.9% FLUSH: 10.0000 mL | INTRAVENOUS | Status: DC | PRN

## 2018-08-30 MED ORDER — VANCOMYCIN HCL 10 G IV SOLR
1250.0000 mg | INTRAVENOUS | Status: DC
  Administered 2018-08-30 – 2018-09-01 (×3): 1250 mg via INTRAVENOUS
  Filled 2018-08-30 (×3): qty 1250

## 2018-08-30 MED ORDER — ORAL CARE MOUTH RINSE
15.0000 mL | OROMUCOSAL | Status: DC
  Administered 2018-08-30 – 2018-09-14 (×148): 15 mL via OROMUCOSAL

## 2018-08-30 MED ORDER — FUROSEMIDE 10 MG/ML IJ SOLN
40.0000 mg | Freq: Two times a day (BID) | INTRAMUSCULAR | Status: DC
  Administered 2018-08-30 (×2): 40 mg via INTRAVENOUS
  Filled 2018-08-30 (×2): qty 4

## 2018-08-30 MED ORDER — VITAMIN K1 10 MG/ML IJ SOLN
5.0000 mg | Freq: Once | INTRAVENOUS | Status: AC
  Administered 2018-08-30: 5 mg via INTRAVENOUS
  Filled 2018-08-30: qty 0.5

## 2018-08-30 NOTE — Progress Notes (Signed)
Pharmacy Antibiotic Note  Cody Patterson is a 42 y.o. male admitted on 09/10/2018 with sepsis.  Pharmacy has been consulted for vancomycin and meropenem dosing.  Random vanc level of 5 shows that pt is clearing vanc; SCr still not stable but apparently improving.  Plan: Vancomycin 1250mg  IV every 24 hours.  Goal trough 15-20 mcg/mL.  Merrem dosing remains appropriate.  Height: 6' (182.9 cm) Weight: 226 lb 3.1 oz (102.6 kg) IBW/kg (Calculated) : 77.6  Temp (24hrs), Avg:100.2 F (37.9 C), Min:97.9 F (36.6 C), Max:102.4 F (39.1 C)  Recent Labs  Lab 08/24/2018 1851  September 27, 2018 0440 09-27-18 0507  09/27/2018 1145  Sep 27, 2018 1718 2018/09/27 1849 09-27-18 2027 08/29/18 0330 08/29/18 0744 08/29/18 1813 08/30/18 0310  WBC 22.4*  --  22.9*  --   --   --   --   --   --   --  23.9*  --   --  20.0*  CREATININE 2.21*  --  2.69*  --    < >  --    < > 3.90*  3.40*  --  3.97* 3.67*  --  3.60* 3.09*  LATICACIDVEN  --    < >  --  5.5*  --  7.2*  --   --  8.7*  --   --  3.4*  --  3.1*  VANCORANDOM  --   --   --   --   --   --   --   --   --   --   --   --   --  5   < > = values in this interval not displayed.    Estimated Creatinine Clearance: 39.4 mL/min (A) (by C-G formula based on SCr of 3.09 mg/dL (H)).    Allergies  Allergen Reactions  . Amoxicillin Swelling  . Penicillins Swelling     Thank you for allowing pharmacy to be a part of this patient's care.  Vernard Gambles, PharmD, BCPS  08/30/2018 5:01 AM

## 2018-08-30 NOTE — Progress Notes (Signed)
Patient ID: Cody Patterson, male   DOB: 12/17/1975, 42 y.o.   MRN: [id number]     Advanced Heart Failure Rounding Note  PCP-Cardiologist: No primary care provider on file.   Subjective:    Patient remains on milrinone 0.5, vasopressin 0.04, norepinephrine 20, amiodarone 30.  He is in NSR.    Tm 101.1, WBCs 20.  H influenzae in sputum.  Abdominal US strongly suggestive of acute cholecystitis.   Creatinine trending down at 3.09, good UOP.  AST/ALT trending down but bilirubin up.   Remains intubated and sedated.    CXR left base infiltrate.   Swan numbers CVP 10 PAP 39/26 CI 2.5 Co-ox 67.5%  Objective:   Weight Range: 104 kg Body mass index is 31.1 kg/m.   Vital Signs:   Temp:  [97.9 F (36.6 C)-102.4 F (39.1 C)] 98.2 F (36.8 C) (10/19 0813) Pulse Rate:  [88-109] 90 (10/19 0813) Resp:  [20-27] 21 (10/19 0813) BP: (92-125)/(59-97) 103/72 (10/19 0700) SpO2:  [96 %-100 %] 96 % (10/19 0813) Arterial Line BP: (87-124)/(58-88) 98/67 (10/19 0700) FiO2 (%):  [30 %-40 %] 30 % (10/19 0813) Weight:  [104 kg] 104 kg (10/19 0500) Last BM Date: 09/03/2018  Weight change: Filed Weights   09/03/2018 0500 08/29/18 0500 08/30/18 0500  Weight: 94.9 kg 102.6 kg 104 kg    Intake/Output:   Intake/Output Summary (Last 24 hours) at 08/30/2018 0815 Last data filed at 08/30/2018 0700 Gross per 24 hour  Intake 3910.59 ml  Output 3105 ml  Net 805.59 ml      Physical Exam   General: Intubated/sedated Neck: JVP 8-9 cm  Lungs: Decreased BS at bases.  CV: Nondisplaced PMI.  Heart regular S1/S2, no S3/S4, no murmur.  Trace ankle edema.   Abdomen: Soft,no hepatosplenomegaly, no distention.  Skin: Intact without lesions or rashes.  Neurologic: Sedated on vent.  Extremities: No clubbing or cyanosis.  HEENT: Normal.    Telemetry   NSR in 90s with PACs, personally reviewed.   EKG    No new tracings.    Labs    CBC Recent Labs    08/29/18 0330 08/29/18 1813 08/30/18 0310    WBC 23.9*  --  20.0*  HGB 12.5* 13.6 12.6*  HCT 38.1* 40.0 38.5*  MCV 85.6  --  84.1  PLT 193  --  160   Basic Metabolic Panel Recent Labs    08/29/18 0330 08/29/18 1813 08/30/18 0310  NA 131* 133* 133*  K 4.3 4.2 4.1  CL 97* 93* 95*  CO2 20*  --  24  GLUCOSE 201* 147* 147*  BUN 41* 51* 59*  CREATININE 3.67* 3.60* 3.09*  CALCIUM 6.7*  --  7.5*  MG 1.8  --  2.2  PHOS 5.6*  --  4.6   Liver Function Tests Recent Labs    08/29/18 0841 08/30/18 0310  AST 8,606* 3,308*  ALT 6,915* 5,164*  ALKPHOS 135* 127*  BILITOT 6.9* 7.7*  PROT 5.6* 5.5*  ALBUMIN 2.8* 2.6*   No results for input(s): LIPASE, AMYLASE in the last 72 hours. Cardiac Enzymes Recent Labs    08/29/18 0841 08/30/18 0310  TROPONINI 1.12* 0.65*    BNP: BNP (last 3 results) No results for input(s): BNP in the last 8760 hours.  ProBNP (last 3 results) No results for input(s): PROBNP in the last 8760 hours.   D-Dimer No results for input(s): DDIMER in the last 72 hours. Hemoglobin A1C No results for input(s): HGBA1C in the last 72 hours.   Fasting Lipid Panel Recent Labs    09/03/2018 1851  TRIG 47   Thyroid Function Tests Recent Labs    09/02/2018 1145  TSH 1.223  T3FREE 1.4*    Other results:   Imaging    US Abdomen Complete  Result Date: 08/29/2018 CLINICAL DATA:  42 year old male with renal failure, elevated LFTs. EXAM: ABDOMEN ULTRASOUND COMPLETE COMPARISON:  CT Abdomen and Pelvis 07/12/2014. FINDINGS: Gallbladder: Floating gallstone measuring 19 millimeters is noted on image 4. There is abundant superimposed dependent gallbladder sludge. There is moderate to severe gallbladder wall thickening up to 13 millimeters. There is trace pericholecystic fluid. However, no sonographic Murphy sign was elicited. Common bile duct: Diameter: 3 millimeters, normal. Liver: Trace perihepatic free fluid. No focal lesion identified. Within normal limits in parenchymal echogenicity. Portal vein is patent  on color Doppler imaging with normal direction of blood flow towards the liver. IVC: Incompletely visualized due to overlying bowel gas, visualized portions within normal limits. Pancreas: Visualized portion unremarkable. Spleen: Size and appearance within normal limits. Right Kidney: Length: 14.4 centimeters. Echogenicity within normal limits. No mass or hydronephrosis visualized. Small volume of right perinephric free fluid (image 88). Left Kidney: Length: 13.2 centimeters. Echogenicity within normal limits. No mass or hydronephrosis visualized. Small volume of left perinephric fluid suspected on image 105. Abdominal aorta: No aneurysm visualized. Other findings: None. IMPRESSION: 1. Abnormal gallbladder with severe wall thickening, sludge, and cholelithiasis. Despite absent sonographic Murphy sign ACUTE CHOLECYSTITIS should be considered. 2. Small volume of perihepatic and pericholecystic free fluid. No evidence of bile duct obstruction. 3. Small volume of bilateral perinephric free fluid which may relate to renal failure. No hydronephrosis and otherwise negative ultrasound appearance of both kidneys. Electronically Signed   By: Genevie Ann M.D.   On: 08/29/2018 11:19     Medications:     Scheduled Medications: . chlorhexidine gluconate (MEDLINE KIT)  15 mL Mouth Rinse BID  . Chlorhexidine Gluconate Cloth  6 each Topical Q0600  . enoxaparin (LOVENOX) injection  30 mg Subcutaneous Q24H  . feeding supplement (VITAL HIGH PROTEIN)  1,000 mL Per Tube Q24H  . furosemide  40 mg Intravenous BID  . hydrocortisone sod succinate (SOLU-CORTEF) inj  50 mg Intravenous Q6H  . insulin aspart  0-9 Units Subcutaneous Q4H  . mouth rinse  15 mL Mouth Rinse 10 times per day  . mupirocin ointment  1 application Nasal BID  . vancomycin variable dose per unstable renal function (pharmacist dosing)   Does not apply See admin instructions    Infusions: . sodium chloride Stopped (08/19/2018 4332)  . sodium chloride Stopped  (08/30/18 0530)  . sodium chloride 10 mL/hr at 08/30/18 0700  . amiodarone 30 mg/hr (08/30/18 0700)  . dexmedetomidine (PRECEDEX) IV infusion Stopped (08/29/18 1236)  . epinephrine Stopped (08/29/18 1003)  . famotidine (PEPCID) IV    . fentaNYL infusion INTRAVENOUS 350 mcg/hr (08/30/18 0700)  . meropenem (MERREM) IV 2 g (08/29/18 2129)  . midazolam (VERSED) infusion 5 mg/hr (08/30/18 0700)  . milrinone 0.5 mcg/kg/min (08/30/18 0700)  . norepinephrine (LEVOPHED) Adult infusion 20 mcg/min (08/30/18 0700)  . vancomycin 166.7 mL/hr at 08/30/18 0700  . vasopressin (PITRESSIN) infusion - *FOR SHOCK* 0.04 Units/min (08/30/18 0700)    PRN Medications: midazolam    Patient Profile   Cody Patterson is a 42 y.o. 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   Presented to [facility] 10/16 with worsening dyspnea. Became hypotensive and with respiratory distress  requiring pressor support and intubation. Transferred to [facility] for treatment and further evaluation.   Assessment/Plan   1. Shock: Mixed septic and cardiogenic shock.  CI 2.5 today with good co-ox at 67.5%.  MAP stable on current support, now off epinephrine.  Lactate lower today at 3.1.  - Continue milrinone 0.5.  - Slow wean of norepionephrine as tolerated.  - Continue vasopressin.   - Suspect primarily septic shock.  2. Acute on chronic systolic CHF:  Echocardiogram from [facility] 10/16: LV moderately dilated, severely impaired EF at 10%, moderate RV dysfunction, moderately elevated RVSP.  Nonischemic cardiomyopathy pre-existed this hospitalization (had cath at [facility]).  Suspect cardiomyopathy is due to substance abuse (amphetamines).  Good UOP with CVP 10 and CI 2.5 + co-ox 67.5%. Flotrack is not matching Swan, going by Swan numbers.   - Continue milrinone as above, titrate down norepinephrine as needed.  - Lasix 40 mg IV bid today.  - He is not candidate for VAD or transplant with active  substance abuse.  3. Acute respiratory failure: Remains intubated and sedated. Suspect PNA.  4. Polysubstance abuse: UDS + amphetamines and THC 5. ID: Septic shock.  Has H influenzae in sputum, suspect PNA.  He also appears to have acute cholecystitis on abdominal US. - Continue meropenem and vancomycin.  - CCM to contact surgery re: acute cholecystitis => would be very high risk surgical candidate, suspect he will need cholecystostomy tube.  6. Transaminitis: Elevated LFTs.  Suspect ischemic hepatitis with shock + acute cholecystitis.  7. Ileus: KUB 08/30/2018 with large illeus in the setting of stool coming through OG tube.  - Improved.  Medication concerns reviewed with patient and pharmacy team. Barriers identified: Non-compliance, substance abuse.   CRITICAL CARE Performed by:    Total critical care time: 40 minutes  Critical care time was exclusive of separately billable procedures and treating other patients.  Critical care was necessary to treat or prevent imminent or life-threatening deterioration.  Critical care was time spent personally by me on the following activities: development of treatment plan with patient and/or surrogate as well as nursing, discussions with consultants, evaluation of patient's response to treatment, examination of patient, obtaining history from patient or surrogate, ordering and performing treatments and interventions, ordering and review of laboratory studies, ordering and review of radiographic studies, pulse oximetry and re-evaluation of patient's condition.   Length of Stay: 3   , MD  08/30/2018, 8:15 AM  Advanced Heart Failure Team Pager [phone] (M-F; 7a - 4p)  Please contact [facility] for night-coverage after hours (4p -7a ) and weekends on [email or web address]  

## 2018-08-30 NOTE — Progress Notes (Signed)
eLink Physician-Brief Progress Note Patient Name: Cody Patterson DOB: October 29, 1976 MRN: [id number]   Date of Service  08/30/2018  HPI/Events of Note  Respiratory alkalosis  eICU Interventions  Reduce tidal volume setting to 7 ml/ kg, and reduce respiratory rate to 20. ABG at 7 AM.        Cody Patterson Cody Patterson 08/30/2018, 6:02 AM

## 2018-08-30 NOTE — Progress Notes (Signed)
Called and updated patient's wife (legally still married) on patient's status and wife set-up password.  Patient's stepfather called and notified to call wife for info and/or password.  Patient's current girlfriend at bedside and notified that any legal/medical decisions will need to be made by next-of-kin (wife). [name], Cody Patterson

## 2018-08-30 NOTE — Progress Notes (Signed)
Pt was manually ventilated bagged lavage with 100% FiO2 for the indication of BBS to auscultation decrease aeration throughout, scattered rhonchi, and for oxygen desaturation. 10cc saline flush was used to lavage. Got back copious/massive amount of thick tan/pink tinged secretions and mucous plugs. Four passes on the suctioning. Each pass got back copious amount of secretions and mucous plugs before the final fourth pass was a small amount. Patient tolerated it well, no distress or complications noted. After mucous plugs and secretions were suctioned out, BBS aeration improved some and SATs returned to 95-96%. RN aware.

## 2018-08-30 NOTE — Progress Notes (Signed)
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 Presented to [facility] with increasing dyspnea. Acute decompensation with hypotension and severe respiratory distress requiring intubation. Transferred to [facility] for management of sepsis, advanced heart failure.  Growing H influenza in sputum.  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  Past Medical History  Known cardiomyopathy and drug abuse (THC and methamphetamines)  Significant Hospital Events   10/16- Intubated at [facility]. 10/17- Worsening shock, maxed on pressors, right heart cath indicative of distributive shock, high output failure 10/18-Started on milrinone, lasix for reduced cardiac output. PA numbers show worsening CO and high wedge 10/19- Started making urine, pressors weaning down, continues on milrinone  Consults: date of consult/date signed off & final recs:  10/17- Cardiology   Procedures (surgical and bedside):  10/16 [facility] R subclavian CVC  10/16 [facility] L femoral arterial line  10/17 PA catheter  Significant Diagnostic Tests:  Echocardiogram 10/17- Mild to moderate LV dilation with EF 20%, diffuse hypokinesis.   Restrictive diastolic function. Mildly dilated RV with mildly   decreased systolic function. No significant valvular   abnormalities  Abdominal ultrasound 08/29/2018- severe gallbladder thickening, sludge, cholelithiasis, small volume of perihepatic, pericholecystic and perinephric free fluid.Marland Kitchen  UDS [place] 10/16- Amphetamines, THC  Swan ganz 10/18 Initial  numbers RA 10 PA 46/33 (41) PCWP 10 Thermo CO/CI  15.2/7.0 Fick 14.1/6.5 (co-ox 86%) SVR 231  Micro Data:  Bcx 10/17  Trach aspirate 10/17- H influenza  Antimicrobials:  Vancomycin 10/17 > Meropenen 10/17 > Aztreonam 10/17  Subjective:  Remains critically ill, sedated on the ventilator Pressors are weaning down.  Urine output is improving Continue on milrinone per cardiology.  Objective   Blood pressure 103/72, pulse 91, temperature 98.1 F (36.7 C), resp. rate 20, height 6' (1.829 m), weight 104 kg, SpO2 97 %. PAP: (33-50)/(19-37) 36/24 CVP:  [4 mmHg-21 mmHg] 8 mmHg PCWP:  [24 mmHg-29 mmHg] 24 mmHg CO:  [2.9 L/min-4.4 L/min] 4.4 L/min CI:  [1.3 L/min/m2-2 L/min/m2] 2 L/min/m2  Vent Mode: PRVC FiO2 (%):  [40 %] 40 % Set Rate:  [20 bmp-24 bmp] 20 bmp Vt Set:  [540 mL-620 mL] 540 mL PEEP:  [5 cmH20] 5 cmH20 Plateau Pressure:  [21 cmH20-24 cmH20] 21 cmH20   Intake/Output Summary (Last 24 hours) at 08/30/2018 0752 Last data filed at 08/30/2018 0700 Gross per 24 hour  Intake 4296.28 ml  Output 3355 ml  Net 941.28 ml   Filed Weights   09/01/2018 0500 08/29/18 0500 08/30/18 0500  Weight: 94.9 kg 102.6 kg 104 kg   Examination: Gen:      No acute distress HEENT:  EOMI, sclera anicteric, ETT Neck:     No masses; no thyromegaly Lungs:    Clear to auscultation bilaterally; normal respiratory effort CV:         Regular rate and rhythm; no murmurs Abd:      + bowel sounds; soft, non-tender; no palpable masses, no distension Ext:    No edema; adequate peripheral perfusion Skin:      Warm and dry; no rash Neuro: Sedated, unresponsive.  Resolved Hospital Problem list  Assessment & Plan:  Severe shock secondary to H. influenzae pneumonia in setting of decompensated nonischemic cardiomyopathy  Shock- Cardiogenic, septic Severe cardiomyopathy, noncompliant V tach Continue weaning pressors Milrinone, Lasix Amiodarone drip  Severe sepsis-H. influenzae pneumonia,?   Acute cholecystitis Broad antibiotic coverage, stress dose steroids Follow final cultures Consult surgery about right upper quadrant ultrasound showing evidence of cholecystitis.  May need to perc drain  Renal failure > improving Follow urine output and creatinine  Acute respiratory failure secondary to pulmonary edema Reduce sedation Start weaning trials as tolerated.  Elevated LFTs, INR secondary to shock liver ? Cholecystitis Monitor labs Surgery consult. May need perc drain  Disposition / Summary of Today's Plan 08/30/18   Continue antibiotics, start pressure support weans Pressors, milrinone for management of septic, cardiogenic shock Surgery consult for evaluation of cholecystitis.  Best Practice   Diet: Start trickle feeds. Pain/Anxiety/Delirium protocol (if indicated): Fentanyl, versed, precedex VAP protocol (if indicated):  Bundle in place. DVT prophylaxis: Lovenox GI prophylaxis: Famotidine Hyperglycemia protocol: monitor for hypoglycemia Mobility: bedrest Code Status: full Family Communication: Significant other updated at bedside.  He is married but estranged from his wife.  Labs personally reviewed.  CBC: reactive leukocytosis. Recent Labs  Lab 09/24/18 1851 08/13/2018 0440  09/02/2018 1213 09/11/2018 1718 08/29/18 0330 08/29/18 1813 08/30/18 0310  WBC 22.4* 22.9*  --   --   --  23.9*  --  20.0*  HGB 14.3 14.0   < > 12.9* 11.2* 12.5* 13.6 12.6*  HCT 45.2 43.1   < > 38.0* 33.0* 38.1* 40.0 38.5*  MCV 87.6 86.9  --   --   --  85.6  --  84.1  PLT 207 215  --   --   --  193  --  160   < > = values in this interval not displayed.    Basic Metabolic Panel: pending Recent Labs  Lab 09/24/18 1851 09/10/2018 0440  08/25/2018 1718 08/22/2018 2027 08/29/18 0330 08/29/18 1813 08/30/18 0310  NA 134* 135   < > 136  137 133* 131* 133* 133*  K 4.9 5.1   < > 5.4*  4.6 5.1 4.3 4.2 4.1  CL 101 98   < > 94*  97* 93* 97* 93* 95*  CO2 21* 21*  --  19* 19* 20*  --  24   GLUCOSE 64* 55*   < > 190*  167* 217* 201* 147* 147*  BUN 20 28*   < > 35*  36* 38* 41* 51* 59*  CREATININE 2.21* 2.69*   < > 3.90*  3.40* 3.97* 3.67* 3.60* 3.09*  CALCIUM 8.2* 7.8*  --  6.8* 7.9* 6.7*  --  7.5*  MG 2.1 1.8  --   --   --  1.8  --  2.2  PHOS 7.2* 6.9*  --   --   --  5.6*  --  4.6   < > = values in this interval not displayed.   GFR: Estimated Creatinine Clearance: 39.6 mL/min (A) (by C-G formula based on SCr of 3.09 mg/dL (H)). Recent Labs  Lab Sep 24, 2018 1851  08/15/2018 0440  09/03/2018 0830 09/07/2018 1145 09/01/2018 1849 08/29/18 0330 08/29/18 0744 08/30/18 0310  PROCALCITON  --   --   --   --  27.23  --   --  22.85  --  14.68  WBC 22.4*  --  22.9*  --   --   --   --  23.9*  --  20.0*  LATICACIDVEN  --    < >  --    < >  --  7.2* 8.7*  --  3.4* 3.1*   < > = values in this interval not displayed.    Liver Function Tests: Recent Labs  Lab 08/15/2018 1851 08/25/2018 1718 08/29/18 0841 08/30/18 0310  AST 3,397* 11,847* 8,606* 3,308*  ALT 2,177* 6,537* 6,915* 5,164*  ALKPHOS 114 112 135* 127*  BILITOT 5.3* 5.7* 6.9* 7.7*  PROT 6.4* 5.4* 5.6* 5.5*  ALBUMIN 3.0* 2.8* 2.8* 2.6*   No results for input(s): LIPASE, AMYLASE in the last 168 hours. No results for input(s): AMMONIA in the last 168 hours.  ABG    Component Value Date/Time   PHART 7.488 (H) 08/30/2018 0739   PCO2ART 38.0 08/30/2018 0739   PO2ART 146.0 (H) 08/30/2018 0739   HCO3 28.9 (H) 08/30/2018 0739   TCO2 30 08/30/2018 0739   ACIDBASEDEF 0.8 08/29/2018 0340   O2SAT 99.0 08/30/2018 0739     Coagulation Profile: Recent Labs  Lab 09/10/2018 1851 08/30/18 0310  INR 2.58 3.40    Cardiac Enzymes: Recent Labs  Lab 08/29/18 0841 08/30/18 0310  TROPONINI 1.12* 0.65*    HbA1C: No results found for: HGBA1C  CBG: Recent Labs  Lab 08/29/18 0747 08/29/18 1155 08/29/18 1601 08/29/18 1946 08/30/18 0313  GLUCAP 180* 160* 150* 150* 139*   The patient is critically ill with multiple organ  system failure and requires high complexity decision making for assessment and support, frequent evaluation and titration of therapies, advanced monitoring, review of radiographic studies and interpretation of complex data.   Critical Care Time devoted to patient care services, exclusive of separately billable procedures, described in this note is 35 minutes.   Chilton Greathouse MD [facility] Pager [phone] If no answer or after 3pm call: [phone] 08/30/2018, 8:39 AM

## 2018-08-30 NOTE — Progress Notes (Signed)
CRITICAL VALUE ALERT  Critical Value:  Lactic Acid 3.1  Date & Time Notied:  08/30/18 @ 0440  Provider Notified: eLink notified  Orders Received/Actions taken: No new orders at this time.  Herma Ard, RN

## 2018-08-30 NOTE — Consult Note (Signed)
Reason for Consult: Cholecystitis/sepsis Referring Physician: Dr. Bernita Raisin  Cody Patterson is an 42 y.o. male.  HPI: 42 y/o transferred from [facility] to [facility]  with shock and severe respiratory failure requiring intubation.  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  He was in shock on pressor.  Seen by Cardiolobgy EF down to 10%, Moderate RV dysfunction. Global hypokinesis. Moderately elevated RVSP. Bedside echo with RV collapse and massive LV.  Coox 85.8 on milrinone 0.375 mcg/kg/min, NE 50, and phenylephrine 460, and vasopressin 0.03. Lactic acid 5.5/  Bilirubin 2.9, AST 3397, ALT 2177 and he had stool coming from the NG, possible RLL infiltrate, acute renal failure with creatinine 2.69, lactate of 5.5, WBC 22.9, INR 2.58, WBC/RBC 21-50 /HPF on UA.abdominal US 10/18:Abnormal gallbladder with severe wall thickening, sludge, and cholelithiasis. Despite absent sonographic Murphy sign ACUTE CHOLECYSTITIS should be considered. Small volume of perihepatic and pericholecystic free fluid. No evidence of bile duct obstruction.    Currently he is sedated on the ventilator. AST 3308, ALt 5164, T bil 7.7, Direct Bil 3.7, Trop 0.65, INR 3.40,WBC 20.0.  CXR: Cardiomegaly with mild edema.  His girlfriend is present in the room.  She reports he has been out of his heart medications for at least 1 month.  He continues to use methamphetamine at least couple times a week.  CMP Latest Ref Rng & Units 08/30/2018 08/29/2018 08/29/2018  Total Bilirubin 0.3 - 1.2 mg/dL 7.7(H) - 6.9(H)  Alkaline Phos 38 - 126 U/L 127(H) - 135(H)  AST 15 - 41 U/L 3,308(H) - 8,606(H)  ALT 0 - 44 U/L 5,164(H) - 6,915(H)   . amiodarone 30 mg/hr (08/30/18 0900)  . dexmedetomidine (PRECEDEX) IV infusion Stopped (08/29/18 1236)  . epinephrine Stopped (08/29/18 1003)  . famotidine (PEPCID) IV    . fentaNYL infusion INTRAVENOUS 300 mcg/hr (08/30/18 0900)  . meropenem (MERREM) IV 2 g  (08/29/18 2129)  . midazolam (VERSED) infusion 3 mg/hr (08/30/18 0904)  . milrinone 0.5 mcg/kg/min (08/30/18 0900)  . norepinephrine (LEVOPHED) Adult infusion 20 mcg/min (08/30/18 0900)  . vancomycin Stopped (08/30/18 0700)  . vasopressin (PITRESSIN) infusion - *FOR SHOCK* 0.04 Units/min (08/30/18 0900)     Past Medical History:  Diagnosis Date  . Cardiomyopathy ([place])   . Heart failure ([place])   . Polysubstance abuse ([place])    THC and Amphetamines     No family history on file.  Social History:  reports that he has current or past drug history. Drugs: Methamphetamines and Marijuana. His tobacco and alcohol histories are not on file.  Allergies:  Allergies  Allergen Reactions  . Amoxicillin Swelling  . Penicillins Swelling    Medications:  Prior to Admission:  Girlfriend says he has been out of cardiac medicines for around 1 month   Scheduled: . chlorhexidine gluconate (MEDLINE KIT)  15 mL Mouth Rinse BID  . Chlorhexidine Gluconate Cloth  6 each Topical Q0600  . enoxaparin (LOVENOX) injection  30 mg Subcutaneous Q24H  . feeding supplement (VITAL HIGH PROTEIN)  1,000 mL Per Tube Q24H  . furosemide  40 mg Intravenous BID  . hydrocortisone sod succinate (SOLU-CORTEF) inj  50 mg Intravenous Q6H  . insulin aspart  0-9 Units Subcutaneous Q4H  . mouth rinse  15 mL Mouth Rinse 10 times per day  . mupirocin ointment  1 application Nasal BID  . vancomycin variable dose per unstable renal function (pharmacist dosing)   Does not apply See  admin instructions   Continuous: . sodium chloride Stopped (08/15/2018 4944)  . sodium chloride Stopped (08/30/18 0530)  . sodium chloride 10 mL/hr at 08/30/18 0700  . amiodarone 30 mg/hr (08/30/18 0700)  . dexmedetomidine (PRECEDEX) IV infusion Stopped (08/29/18 1236)  . epinephrine Stopped (08/29/18 1003)  . famotidine (PEPCID) IV    . fentaNYL infusion INTRAVENOUS 300 mcg/hr (08/30/18 0847)  . meropenem (MERREM) IV 2 g (08/29/18 2129)  .  midazolam (VERSED) infusion 5 mg/hr (08/30/18 0700)  . milrinone 0.5 mcg/kg/min (08/30/18 0700)  . norepinephrine (LEVOPHED) Adult infusion 20 mcg/min (08/30/18 0700)  . vancomycin 166.7 mL/hr at 08/30/18 0700  . vasopressin (PITRESSIN) infusion - *FOR SHOCK* 0.04 Units/min (08/30/18 0700)   Anti-infectives (From admission, onward)   Start     Dose/Rate Route Frequency Ordered Stop   08/30/18 0730  vancomycin (VANCOCIN) 1,250 mg in sodium chloride 0.9 % 250 mL IVPB  Status:  Discontinued     1,250 mg 166.7 mL/hr over 90 Minutes Intravenous Every 24 hours 08/30/18 0728 08/30/18 0803   08/30/18 0600  vancomycin (VANCOCIN) 1,250 mg in sodium chloride 0.9 % 250 mL IVPB     1,250 mg 166.7 mL/hr over 90 Minutes Intravenous Every 24 hours 08/30/18 0500     09/08/2018 1800  vancomycin (VANCOCIN) IVPB 750 mg/150 ml premix  Status:  Discontinued     750 mg 150 mL/hr over 60 Minutes Intravenous Every 12 hours 08/25/2018 0446 09/10/2018 1445   08/16/2018 1445  vancomycin variable dose per unstable renal function (pharmacist dosing)      Does not apply See admin instructions 08/22/2018 1445     08/15/2018 1000  meropenem (MERREM) 2 g in sodium chloride 0.9 % 100 mL IVPB     2 g 200 mL/hr over 30 Minutes Intravenous Every 12 hours 08/15/2018 0831     09/04/2018 0700  aztreonam (AZACTAM) 1 g in sodium chloride 0.9 % 100 mL IVPB  Status:  Discontinued     1 g 200 mL/hr over 30 Minutes Intravenous Every 8 hours 09/11/2018 0655 08/19/2018 0831   08/15/2018 0530  vancomycin (VANCOCIN) 1,500 mg in sodium chloride 0.9 % 500 mL IVPB     1,500 mg 250 mL/hr over 120 Minutes Intravenous  Once 08/12/2018 0446 08/29/18 0716   08/16/2018 0515  aztreonam (AZACTAM) 1 g in sodium chloride 0.9 % 100 mL IVPB  Status:  Discontinued     1 g 200 mL/hr over 30 Minutes Intravenous Every 8 hours 09/08/2018 0446 08/14/2018 0655      Results for orders placed or performed during the hospital encounter of 08/31/2018 (from the past 48 hour(s))  Culture,  respiratory     Status: None (Preliminary result)   Collection Time: 09/11/2018 10:33 AM  Result Value Ref Range   Specimen Description TRACHEAL ASPIRATE    Special Requests NONE    Gram Stain      MODERATE WBC PRESENT, PREDOMINANTLY PMN MODERATE GRAM NEGATIVE COCCOBACILLI RARE GRAM POSITIVE COCCI    Culture      MODERATE HAEMOPHILUS INFLUENZAE BETA LACTAMASE POSITIVE CULTURE REINCUBATED FOR BETTER GROWTH Performed at [facility], [place]. [address], [place], [place] [address]    Report Status PENDING   I-STAT 3, arterial blood gas (G3+)     Status: Abnormal   Collection Time: 09/01/2018 10:41 AM  Result Value Ref Range   pH, Arterial 7.201 (L) 7.350 - 7.450   pCO2 arterial 56.7 (H) 32.0 - 48.0 mmHg   pO2, Arterial 103.0  83.0 - 108.0 mmHg   Bicarbonate 22.2 20.0 - 28.0 mmol/L   TCO2 24 22 - 32 mmol/L   O2 Saturation 96.0 %   Acid-base deficit 6.0 (H) 0.0 - 2.0 mmol/L   Patient temperature 37.2 C    Sample type ARTERIAL   I-STAT, chem 8     Status: Abnormal   Collection Time: 08/13/2018 10:47 AM  Result Value Ref Range   Sodium 135 135 - 145 mmol/L   Potassium 5.1 3.5 - 5.1 mmol/L   Chloride 100 98 - 111 mmol/L   BUN 35 (H) 6 - 20 mg/dL   Creatinine, Ser 3.50 (H) 0.61 - 1.24 mg/dL   Glucose, Bld 99 70 - 99 mg/dL   Calcium, Ion 0.89 (LL) 1.15 - 1.40 mmol/L   TCO2 23 22 - 32 mmol/L   Hemoglobin 12.9 (L) 13.0 - 17.0 g/dL   HCT 38.0 (L) 39.0 - 52.0 %  TSH     Status: None   Collection Time: 08/16/2018 11:45 AM  Result Value Ref Range   TSH 1.223 0.350 - 4.500 uIU/mL    Comment: Performed by a 3rd Generation assay with a functional sensitivity of <=0.01 uIU/mL. Performed at [facility], [place] [address] [place], [place] [address]   T3, free     Status: Abnormal   Collection Time: 09/04/2018 11:45 AM  Result Value Ref Range   T3, Free 1.4 (L) 2.0 - 4.4 pg/mL    Comment: (NOTE) Performed At: [facility] [place], [place] [id number] Rush Farmer  MD [id number]   T4, free     Status: None   Collection Time: 09/03/2018 11:45 AM  Result Value Ref Range   Free T4 1.04 0.82 - 1.77 ng/dL    Comment: (NOTE) Biotin ingestion may interfere with free T4 tests. If the results are inconsistent with the TSH level, previous test results, or the clinical presentation, then consider biotin interference. If needed, order repeat testing after stopping biotin. Performed at [facility], [place] [address], [place], [place] [address]   Lactic acid, plasma     Status: Abnormal   Collection Time: 08/20/2018 11:45 AM  Result Value Ref Range   Lactic Acid, Venous 7.2 (HH) 0.5 - 1.9 mmol/L    Comment: CRITICAL RESULT CALLED TO, READ BACK BY AND VERIFIED WITHCharletta Cousin [id number] WILDERK Performed at [facility], [place] [address], [place], [place] [address]   .Cooxemetry Panel (carboxy, met, total hgb, O2 sat)     Status: Abnormal   Collection Time: 09/06/2018 12:03 PM  Result Value Ref Range   Total hemoglobin 11.1 (L) 12.0 - 16.0 g/dL   O2 Saturation 83.0 %   Carboxyhemoglobin 1.4 0.5 - 1.5 %   Methemoglobin 1.2 0.0 - 1.5 %  I-STAT 3, arterial blood gas (G3+)     Status: Abnormal   Collection Time: 08/26/2018 12:04 PM  Result Value Ref Range   pH, Arterial 7.356 7.350 - 7.450   pCO2 arterial 38.6 32.0 - 48.0 mmHg   pO2, Arterial 145.0 (H) 83.0 - 108.0 mmHg   Bicarbonate 21.7 20.0 - 28.0 mmol/L   TCO2 23 22 - 32 mmol/L   O2 Saturation 99.0 %   Acid-base deficit 4.0 (H) 0.0 - 2.0 mmol/L   Patient temperature 36.7 C    Sample type ARTERIAL   I-STAT, chem 8     Status: Abnormal   Collection Time: 09/02/2018 12:13 PM  Result Value Ref Range  Sodium 134 (L) 135 - 145 mmol/L   Potassium 4.4 3.5 - 5.1 mmol/L   Chloride 104 98 - 111 mmol/L   BUN 32 (H) 6 - 20 mg/dL   Creatinine, Ser 3.20 (H) 0.61 - 1.24 mg/dL   Glucose, Bld 100 (H) 70 - 99 mg/dL   Calcium, Ion 1.02 (L) 1.15 - 1.40 mmol/L   TCO2 23 22 - 32 mmol/L   Hemoglobin 12.9 (L)  13.0 - 17.0 g/dL   HCT 38.0 (L) 39.0 - 52.0 %  Glucose, capillary     Status: Abnormal   Collection Time: 08/20/2018  4:32 PM  Result Value Ref Range   Glucose-Capillary 109 (H) 70 - 99 mg/dL  Comprehensive metabolic panel     Status: Abnormal   Collection Time: 09/02/2018  5:18 PM  Result Value Ref Range   Sodium 136 135 - 145 mmol/L   Potassium 5.4 (H) 3.5 - 5.1 mmol/L   Chloride 94 (L) 98 - 111 mmol/L   CO2 19 (L) 22 - 32 mmol/L   Glucose, Bld 190 (H) 70 - 99 mg/dL   BUN 35 (H) 6 - 20 mg/dL   Creatinine, Ser 3.90 (H) 0.61 - 1.24 mg/dL   Calcium 6.8 (L) 8.9 - 10.3 mg/dL   Total Protein 5.4 (L) 6.5 - 8.1 g/dL   Albumin 2.8 (L) 3.5 - 5.0 g/dL   AST 11,847 (H) 15 - 41 U/L    Comment: RESULTS CONFIRMED BY MANUAL DILUTION   ALT 6,537 (H) 0 - 44 U/L    Comment: RESULTS CONFIRMED BY MANUAL DILUTION   Alkaline Phosphatase 112 38 - 126 U/L   Total Bilirubin 5.7 (H) 0.3 - 1.2 mg/dL   GFR calc non Af Amer 18 (L) >60 mL/min   GFR calc Af Amer 21 (L) >60 mL/min    Comment: (NOTE) The eGFR has been calculated using the CKD EPI equation. This calculation has not been validated in all clinical situations. eGFR's persistently <60 mL/min signify possible Chronic Kidney Disease.    Anion gap 23 (H) 5 - 15    Comment: Performed at [facility], [place] [address] [place], [place] [address]  I-STAT, [place] 8     Status: Abnormal   Collection Time: 08/18/2018  5:18 PM  Result Value Ref Range   Sodium 137 135 - 145 mmol/L   Potassium 4.6 3.5 - 5.1 mmol/L   Chloride 97 (L) 98 - 111 mmol/L   BUN 36 (H) 6 - 20 mg/dL   Creatinine, Ser 3.40 (H) 0.61 - 1.24 mg/dL   Glucose, Bld 167 (H) 70 - 99 mg/dL   Calcium, Ion 0.74 (LL) 1.15 - 1.40 mmol/L   TCO2 19 (L) 22 - 32 mmol/L   Hemoglobin 11.2 (L) 13.0 - 17.0 g/dL   HCT 33.0 (L) 39.0 - 52.0 %  I-STAT 3, arterial blood gas (G3+)     Status: Abnormal   Collection Time: 08/26/2018  5:54 PM  Result Value Ref Range   pH, Arterial 7.327 (L) 7.350 - 7.450    pCO2 arterial 37.1 32.0 - 48.0 mmHg   pO2, Arterial 106.0 83.0 - 108.0 mmHg   Bicarbonate 19.3 (L) 20.0 - 28.0 mmol/L   TCO2 20 (L) 22 - 32 mmol/L   O2 Saturation 98.0 %   Acid-base deficit 6.0 (H) 0.0 - 2.0 mmol/L   Patient temperature 37.6 C    Sample type ARTERIAL   Lactic acid, plasma     Status: Abnormal   Collection  Time: 08/21/2018  6:49 PM  Result Value Ref Range   Lactic Acid, Venous 8.7 (HH) 0.5 - 1.9 mmol/L    Comment: CRITICAL RESULT CALLED TO, READ BACK BY AND VERIFIED WITH: Barry Brunner 2026 08/23/2018 WBOND Performed at [facility], [place] [address] [place], [place] [address]   Basic metabolic panel     Status: Abnormal   Collection Time: 08/12/2018  8:27 PM  Result Value Ref Range   Sodium 133 (L) 135 - 145 mmol/L   Potassium 5.1 3.5 - 5.1 mmol/L   Chloride 93 (L) 98 - 111 mmol/L   CO2 19 (L) 22 - 32 mmol/L   Glucose, Bld 217 (H) 70 - 99 mg/dL   BUN 38 (H) 6 - 20 mg/dL   Creatinine, Ser 3.97 (H) 0.61 - 1.24 mg/dL   Calcium 7.9 (L) 8.9 - 10.3 mg/dL   GFR calc non Af Amer 17 (L) >60 mL/min   GFR calc Af Amer 20 (L) >60 mL/min    Comment: (NOTE) The eGFR has been calculated using the CKD EPI equation. This calculation has not been validated in all clinical situations. eGFR's persistently <60 mL/min signify possible Chronic Kidney Disease.    Anion gap 21 (H) 5 - 15    Comment: RESULT CHECKED Performed at [facility], [place] [address], [place], [place] [address]   I-STAT 3, arterial blood gas (G3+)     Status: Abnormal   Collection Time: 09/01/2018  8:35 PM  Result Value Ref Range   pH, Arterial 7.333 (L) 7.350 - 7.450   pCO2 arterial 41.1 32.0 - 48.0 mmHg   pO2, Arterial 104.0 83.0 - 108.0 mmHg   Bicarbonate 21.7 20.0 - 28.0 mmol/L   TCO2 23 22 - 32 mmol/L   O2 Saturation 97.0 %   Acid-base deficit 4.0 (H) 0.0 - 2.0 mmol/L   Patient temperature 37.7 C    Sample type ARTERIAL   Glucose, capillary     Status: Abnormal   Collection Time: 08/25/2018   9:26 PM  Result Value Ref Range   Glucose-Capillary 213 (H) 70 - 99 mg/dL   Comment 1 Arterial Specimen   Glucose, capillary     Status: Abnormal   Collection Time: 08/29/18 12:07 AM  Result Value Ref Range   Glucose-Capillary 191 (H) 70 - 99 mg/dL   Comment 1 Arterial Specimen   Glucose, capillary     Status: Abnormal   Collection Time: 08/29/18  3:26 AM  Result Value Ref Range   Glucose-Capillary 195 (H) 70 - 99 mg/dL   Comment 1 Arterial Specimen   Basic metabolic panel     Status: Abnormal   Collection Time: 08/29/18  3:30 AM  Result Value Ref Range   Sodium 131 (L) 135 - 145 mmol/L   Potassium 4.3 3.5 - 5.1 mmol/L   Chloride 97 (L) 98 - 111 mmol/L   CO2 20 (L) 22 - 32 mmol/L   Glucose, Bld 201 (H) 70 - 99 mg/dL   BUN 41 (H) 6 - 20 mg/dL   Creatinine, Ser 3.67 (H) 0.61 - 1.24 mg/dL   Calcium 6.7 (L) 8.9 - 10.3 mg/dL   GFR calc non Af Amer 19 (L) >60 mL/min   GFR calc Af Amer 22 (L) >60 mL/min    Comment: (NOTE) The eGFR has been calculated using the CKD EPI equation. This calculation has not been validated in all clinical situations. eGFR's persistently <60 mL/min signify possible Chronic Kidney Disease.  Anion gap 14 5 - 15    Comment: Performed at [facility] [address] [place], [place] [address]  Procalcitonin     Status: None   Collection Time: 08/29/18  3:30 AM  Result Value Ref Range   Procalcitonin 22.85 ng/mL    Comment:        Interpretation: PCT >= 10 ng/mL: Important systemic inflammatory response, almost exclusively due to severe bacterial sepsis or septic shock. (NOTE)       Sepsis PCT Algorithm           Lower Respiratory Tract                                      Infection PCT Algorithm    ----------------------------     ----------------------------         PCT < 0.25 ng/mL                PCT < 0.10 ng/mL         Strongly encourage             Strongly discourage   discontinuation of antibiotics    initiation of antibiotics     ----------------------------     -----------------------------       PCT 0.25 - 0.50 ng/mL            PCT 0.10 - 0.25 ng/mL               OR       >80% decrease in PCT            Discourage initiation of                                            antibiotics      Encourage discontinuation           of antibiotics    ----------------------------     -----------------------------         PCT >= 0.50 ng/mL              PCT 0.26 - 0.50 ng/mL                AND       <80% decrease in PCT             Encourage initiation of                                             antibiotics       Encourage continuation           of antibiotics    ----------------------------     -----------------------------        PCT >= 0.50 ng/mL                  PCT > 0.50 ng/mL               AND         increase in PCT                  Strongly encourage  initiation of antibiotics    Strongly encourage escalation           of antibiotics                                     -----------------------------                                           PCT <= 0.25 ng/mL                                                 OR                                        > 80% decrease in PCT                                     Discontinue / Do not initiate                                             antibiotics Performed at [facility], [address] [place], [place] [address]   CBC     Status: Abnormal   Collection Time: 08/29/18  3:30 AM  Result Value Ref Range   WBC 23.9 (H) 4.0 - 10.5 K/uL   RBC 4.45 4.22 - 5.81 MIL/uL   Hemoglobin 12.5 (L) 13.0 - 17.0 g/dL   HCT 38.1 (L) 39.0 - 52.0 %   MCV 85.6 80.0 - 100.0 fL   MCH 28.1 26.0 - 34.0 pg   MCHC 32.8 30.0 - 36.0 g/dL   RDW 16.3 (H) 11.5 - 15.5 %   Platelets 193 150 - 400 K/uL   nRBC 0.3 (H) 0.0 - 0.2 %    Comment: Performed at [facility] [address], [place], [place] [address]  Magnesium     Status: None    Collection Time: 08/29/18  3:30 AM  Result Value Ref Range   Magnesium 1.8 1.7 - 2.4 mg/dL    Comment: Performed at [facility] [address], [place], [place] [address]  Phosphorus     Status: Abnormal   Collection Time: 08/29/18  3:30 AM  Result Value Ref Range   Phosphorus 5.6 (H) 2.5 - 4.6 mg/dL    Comment: Performed at [facility] [address] [place], [place] [address]  Cooxemetry Panel (carboxy, met, total hgb, O2 sat)     Status: Abnormal   Collection Time: 08/29/18  3:40 AM  Result Value Ref Range   Total hemoglobin 12.4 12.0 - 16.0 g/dL   O2 Saturation 73.9 %   Carboxyhemoglobin 1.0 0.5 - 1.5 %   Methemoglobin 1.7 (H) 0.0 - 1.5 %  Blood gas, arterial     Status: Abnormal   Collection Time: 08/29/18  3:40 AM  Result Value Ref Range   FIO2  60.00    Delivery systems VENTILATOR    Mode PRESSURE REGULATED VOLUME CONTROL    VT 620 mL   LHR 24 resp/min   Peep/cpap 5.0 cm H20   pH, Arterial 7.401 7.350 - 7.450   pCO2 arterial 38.6 32.0 - 48.0 mmHg   pO2, Arterial 152 (H) 83.0 - 108.0 mmHg   Bicarbonate 23.2 20.0 - 28.0 mmol/L   Acid-base deficit 0.8 0.0 - 2.0 mmol/L   O2 Saturation 98.9 %   Patient temperature 100.0    Collection site A-LINE    Drawn by COLLECTED BY NURSE    Sample type ARTERIAL    Allens test (pass/fail) NOT INDICATED (A) PASS  Lactic acid, plasma     Status: Abnormal   Collection Time: 08/29/18  7:44 AM  Result Value Ref Range   Lactic Acid, Venous 3.4 (HH) 0.5 - 1.9 mmol/L    Comment: CRITICAL RESULT CALLED TO, READ BACK BY AND VERIFIED WITH: [facility] 4656 08/29/18 CLARK,S Performed at [facility], [place] [address], [place], [place] [address]   Glucose, capillary     Status: Abnormal   Collection Time: 08/29/18  7:47 AM  Result Value Ref Range   Glucose-Capillary 180 (H) 70 - 99 mg/dL  Hepatic function panel     Status: Abnormal   Collection Time: 08/29/18  8:41 AM  Result Value Ref Range   Total Protein 5.6 (L)  6.5 - 8.1 g/dL   Albumin 2.8 (L) 3.5 - 5.0 g/dL   AST 8,606 (H) 15 - 41 U/L    Comment: RESULTS CONFIRMED BY MANUAL DILUTION   ALT 6,915 (H) 0 - 44 U/L    Comment: RESULTS CONFIRMED BY MANUAL DILUTION   Alkaline Phosphatase 135 (H) 38 - 126 U/L   Total Bilirubin 6.9 (H) 0.3 - 1.2 mg/dL   Bilirubin, Direct 3.7 (H) 0.0 - 0.2 mg/dL   Indirect Bilirubin 3.2 (H) 0.3 - 0.9 mg/dL    Comment: Performed at [facility], [place] [address], [place], [place] [address]  Troponin I     Status: Abnormal   Collection Time: 08/29/18  8:41 AM  Result Value Ref Range   Troponin I 1.12 (HH) <0.03 ng/mL    Comment: CRITICAL RESULT CALLED TO, READ BACK BY AND VERIFIED WITH: Chrissie Noa RN [phone] 08/29/2018 BY A BENNETT Performed at [facility], [address], [place], [place] [address]   .Cooxemetry Panel (carboxy, met, total hgb, O2 sat)     Status: None   Collection Time: 08/29/18 10:30 AM  Result Value Ref Range   Total hemoglobin 12.9 12.0 - 16.0 g/dL   O2 Saturation 46.5 %   Carboxyhemoglobin 1.0 0.5 - 1.5 %   Methemoglobin 1.1 0.0 - 1.5 %  Glucose, capillary     Status: Abnormal   Collection Time: 08/29/18 11:55 AM  Result Value Ref Range   Glucose-Capillary 160 (H) 70 - 99 mg/dL   Comment 1 Capillary Specimen   .Cooxemetry Panel (carboxy, met, total hgb, O2 sat)     Status: Abnormal   Collection Time: 08/29/18  3:20 PM  Result Value Ref Range   Total hemoglobin 13.0 12.0 - 16.0 g/dL   O2 Saturation 60.4 %   Carboxyhemoglobin 1.0 0.5 - 1.5 %   Methemoglobin 1.6 (H) 0.0 - 1.5 %  Glucose, capillary     Status: Abnormal   Collection Time: 08/29/18  4:01 PM  Result Value Ref Range   Glucose-Capillary 150 (H) 70 -  99 mg/dL   Comment 1 Capillary Specimen   I-STAT, chem 8     Status: Abnormal   Collection Time: 08/29/18  6:13 PM  Result Value Ref Range   Sodium 133 (L) 135 - 145 mmol/L   Potassium 4.2 3.5 - 5.1 mmol/L   Chloride 93 (L) 98 - 111 mmol/L   BUN 51 (H) 6 - 20 mg/dL    Creatinine, Ser 3.60 (H) 0.61 - 1.24 mg/dL   Glucose, Bld 147 (H) 70 - 99 mg/dL   Calcium, Ion 0.90 (L) 1.15 - 1.40 mmol/L   TCO2 26 22 - 32 mmol/L   Hemoglobin 13.6 13.0 - 17.0 g/dL   HCT 40.0 39.0 - 52.0 %  Glucose, capillary     Status: Abnormal   Collection Time: 08/29/18  7:46 PM  Result Value Ref Range   Glucose-Capillary 150 (H) 70 - 99 mg/dL   Comment 1 Venous Specimen   Procalcitonin     Status: None   Collection Time: 08/30/18  3:10 AM  Result Value Ref Range   Procalcitonin 14.68 ng/mL    Comment:        Interpretation: PCT >= 10 ng/mL: Important systemic inflammatory response, almost exclusively due to severe bacterial sepsis or septic shock. (NOTE)       Sepsis PCT Algorithm           Lower Respiratory Tract                                      Infection PCT Algorithm    ----------------------------     ----------------------------         PCT < 0.25 ng/mL                PCT < 0.10 ng/mL         Strongly encourage             Strongly discourage   discontinuation of antibiotics    initiation of antibiotics    ----------------------------     -----------------------------       PCT 0.25 - 0.50 ng/mL            PCT 0.10 - 0.25 ng/mL               OR       >80% decrease in PCT            Discourage initiation of                                            antibiotics      Encourage discontinuation           of antibiotics    ----------------------------     -----------------------------         PCT >= 0.50 ng/mL              PCT 0.26 - 0.50 ng/mL                AND       <80% decrease in PCT             Encourage initiation of  antibiotics       Encourage continuation           of antibiotics    ----------------------------     -----------------------------        PCT >= 0.50 ng/mL                  PCT > 0.50 ng/mL               AND         increase in PCT                  Strongly encourage                                       initiation of antibiotics    Strongly encourage escalation           of antibiotics                                     -----------------------------                                           PCT <= 0.25 ng/mL                                                 OR                                        > 80% decrease in PCT                                     Discontinue / Do not initiate                                             antibiotics Performed at [facility], [address], [place], [place] [address]   CBC     Status: Abnormal   Collection Time: 08/30/18  3:10 AM  Result Value Ref Range   WBC 20.0 (H) 4.0 - 10.5 K/uL   RBC 4.58 4.22 - 5.81 MIL/uL   Hemoglobin 12.6 (L) 13.0 - 17.0 g/dL   HCT 38.5 (L) 39.0 - 52.0 %   MCV 84.1 80.0 - 100.0 fL   MCH 27.5 26.0 - 34.0 pg   MCHC 32.7 30.0 - 36.0 g/dL   RDW 16.5 (H) 11.5 - 15.5 %   Platelets 160 150 - 400 K/uL   nRBC 0.8 (H) 0.0 - 0.2 %    Comment: Performed at [facility], [place] [address] [place], [place] [address]  Protime-INR     Status: Abnormal   Collection Time: 08/30/18  3:10 AM  Result Value Ref Range   Prothrombin Time 33.9 (H) 11.4 - 15.2 seconds   INR 3.40  Comment: Performed at [facility], [place] [address], [place], [place] [address]  Magnesium     Status: None   Collection Time: 08/30/18  3:10 AM  Result Value Ref Range   Magnesium 2.2 1.7 - 2.4 mg/dL    Comment: Performed at [place] [facility] [address] [place], [place] [address]  Phosphorus     Status: None   Collection Time: 08/30/18  3:10 AM  Result Value Ref Range   Phosphorus 4.6 2.5 - 4.6 mg/dL    Comment: Performed at [facility] [address] [place], [place] [address]  Lactic acid, plasma     Status: Abnormal   Collection Time: 08/30/18  3:10 AM  Result Value Ref Range   Lactic Acid, Venous 3.1 (HH) 0.5 - 1.9 mmol/L    Comment: CRITICAL RESULT CALLED TO, READ BACK BY AND VERIFIED WITH: MOSELEY,C RN  08/30/2018 0441 JORDANS Performed at [facility], [place] [address] [place], [place] [address]   Troponin I     Status: Abnormal   Collection Time: 08/30/18  3:10 AM  Result Value Ref Range   Troponin I 0.65 (HH) <0.03 ng/mL    Comment: CRITICAL VALUE NOTED.  VALUE IS CONSISTENT WITH PREVIOUSLY REPORTED AND CALLED VALUE. Performed at [facility], [place] [address], [place], [place] [address]   Comprehensive metabolic panel     Status: Abnormal   Collection Time: 08/30/18  3:10 AM  Result Value Ref Range   Sodium 133 (L) 135 - 145 mmol/L   Potassium 4.1 3.5 - 5.1 mmol/L   Chloride 95 (L) 98 - 111 mmol/L   CO2 24 22 - 32 mmol/L   Glucose, Bld 147 (H) 70 - 99 mg/dL   BUN 59 (H) 6 - 20 mg/dL   Creatinine, Ser 3.09 (H) 0.61 - 1.24 mg/dL   Calcium 7.5 (L) 8.9 - 10.3 mg/dL   Total Protein 5.5 (L) 6.5 - 8.1 g/dL   Albumin 2.6 (L) 3.5 - 5.0 g/dL   AST 3,308 (H) 15 - 41 U/L    Comment: RESULTS CONFIRMED BY MANUAL DILUTION   ALT 5,164 (H) 0 - 44 U/L    Comment: RESULTS CONFIRMED BY MANUAL DILUTION   Alkaline Phosphatase 127 (H) 38 - 126 U/L   Total Bilirubin 7.7 (H) 0.3 - 1.2 mg/dL   GFR calc non Af Amer 24 (L) >60 mL/min   GFR calc Af Amer 27 (L) >60 mL/min    Comment: (NOTE) The eGFR has been calculated using the CKD EPI equation. This calculation has not been validated in all clinical situations. eGFR's persistently <60 mL/min signify possible Chronic Kidney Disease.    Anion gap 14 5 - 15    Comment: Performed at [facility] [address], [place], [place] [address]  Vancomycin, random     Status: None   Collection Time: 08/30/18  3:10 AM  Result Value Ref Range   Vancomycin Rm 5     Comment:        Random Vancomycin therapeutic range is dependent on dosage and time of specimen collection. A peak range is 20.0-40.0 ug/mL A trough range is 5.0-15.0 ug/mL        Performed at [facility] [address], [place], [place] [address]   Cooxemetry Panel  (carboxy, met, total hgb, O2 sat)     Status: Abnormal   Collection Time: 08/30/18  3:12 AM  Result Value Ref Range  Total hemoglobin 13.4 12.0 - 16.0 g/dL   O2 Saturation 67.5 %   Carboxyhemoglobin 1.1 0.5 - 1.5 %   Methemoglobin 1.6 (H) 0.0 - 1.5 %  Glucose, capillary     Status: Abnormal   Collection Time: 08/30/18  3:13 AM  Result Value Ref Range   Glucose-Capillary 139 (H) 70 - 99 mg/dL   Comment 1 Venous Specimen   I-STAT 3, arterial blood gas (G3+)     Status: Abnormal   Collection Time: 08/30/18  3:35 AM  Result Value Ref Range   pH, Arterial 7.524 (H) 7.350 - 7.450   pCO2 arterial 31.5 (L) 32.0 - 48.0 mmHg   pO2, Arterial 168.0 (H) 83.0 - 108.0 mmHg   Bicarbonate 25.7 20.0 - 28.0 mmol/L   TCO2 27 22 - 32 mmol/L   O2 Saturation 100.0 %   Acid-Base Excess 4.0 (H) 0.0 - 2.0 mmol/L   Patient temperature 100.6 F    Collection site ARTERIAL LINE    Sample type ARTERIAL   I-STAT 3, arterial blood gas (G3+)     Status: Abnormal   Collection Time: 08/30/18  7:39 AM  Result Value Ref Range   pH, Arterial 7.488 (H) 7.350 - 7.450   pCO2 arterial 38.0 32.0 - 48.0 mmHg   pO2, Arterial 146.0 (H) 83.0 - 108.0 mmHg   Bicarbonate 28.9 (H) 20.0 - 28.0 mmol/L   TCO2 30 22 - 32 mmol/L   O2 Saturation 99.0 %   Acid-Base Excess 5.0 (H) 0.0 - 2.0 mmol/L   Patient temperature 36.8 C    Sample type ARTERIAL     US Abdomen Complete  Result Date: 08/29/2018 CLINICAL DATA:  42 year old male with renal failure, elevated LFTs. EXAM: ABDOMEN ULTRASOUND COMPLETE COMPARISON:  CT Abdomen and Pelvis 07/12/2014. FINDINGS: Gallbladder: Floating gallstone measuring 19 millimeters is noted on image 4. There is abundant superimposed dependent gallbladder sludge. There is moderate to severe gallbladder wall thickening up to 13 millimeters. There is trace pericholecystic fluid. However, no sonographic Murphy sign was elicited. Common bile duct: Diameter: 3 millimeters, normal. Liver: Trace perihepatic free  fluid. No focal lesion identified. Within normal limits in parenchymal echogenicity. Portal vein is patent on color Doppler imaging with normal direction of blood flow towards the liver. IVC: Incompletely visualized due to overlying bowel gas, visualized portions within normal limits. Pancreas: Visualized portion unremarkable. Spleen: Size and appearance within normal limits. Right Kidney: Length: 14.4 centimeters. Echogenicity within normal limits. No mass or hydronephrosis visualized. Small volume of right perinephric free fluid (image 88). Left Kidney: Length: 13.2 centimeters. Echogenicity within normal limits. No mass or hydronephrosis visualized. Small volume of left perinephric fluid suspected on image 105. Abdominal aorta: No aneurysm visualized. Other findings: None. IMPRESSION: 1. Abnormal gallbladder with severe wall thickening, sludge, and cholelithiasis. Despite absent sonographic Murphy sign ACUTE CHOLECYSTITIS should be considered. 2. Small volume of perihepatic and pericholecystic free fluid. No evidence of bile duct obstruction. 3. Small volume of bilateral perinephric free fluid which may relate to renal failure. No hydronephrosis and otherwise negative ultrasound appearance of both kidneys. Electronically Signed   By: Genevie Ann M.D.   On: 08/29/2018 11:19   Dg Chest Port 1 View  Result Date: 08/30/2018 CLINICAL DATA:  Acute respiratory failure. EXAM: PORTABLE CHEST 1 VIEW COMPARISON:  One-view chest x-ray 08/29/2018 FINDINGS: Heart is enlarged. Endotracheal tube is scratched at the endotracheal tube terminates 6 cm above the carina, in satisfactory position. The tip of a Swan-Ganz catheter is in the  main pulmonary outflow tract. The side port of the NG tube is in the stomach. For bladder pads are in place. The warming/cooling blanket is in place. Mild edema is stable. Left basilar airspace disease is noted. IMPRESSION: 1. Cardiomegaly with mild edema. 2. Left basilar airspace disease likely  reflects atelectasis. Infection is not excluded. 3. Support apparatus is stable and in satisfactory position. Electronically Signed   By: San Morelle M.D.   On: 08/30/2018 08:26   Dg Chest Port 1 View  Result Date: 08/29/2018 CLINICAL DATA:  Check endotracheal tube placement EXAM: PORTABLE CHEST 1 VIEW COMPARISON:  09/07/2018 FINDINGS: Endotracheal tube, Swan-Ganz catheter, right subclavian central line and nasogastric catheter are noted and stable in appearance. Cardiac shadow is enlarged but stable. Stable left basilar infiltrate is noted when compare with the previous day. No new focal abnormality is noted. IMPRESSION: Stable left basilar infiltrate. Tubes and lines as described. Electronically Signed   By: Inez Catalina M.D.   On: 08/29/2018 08:09   Dg Chest Port 1 View  Result Date: 08/31/2018 CLINICAL DATA:  Recent hypoxia EXAM: PORTABLE CHEST 1 VIEW COMPARISON:  10/17/9 FINDINGS: Cardiac shadow remains enlarged. Endotracheal tube and nasogastric catheter are stable. Swan-Ganz catheter and right subclavian catheter are stable as well. Lungs are well aerated bilaterally. Patchy left retrocardiac density is noted. Stable central vascular congestion is noted without significant interstitial edema. No pneumothorax is noted. IMPRESSION: Stable vascular congestion and left basilar infiltrate. No new focal abnormality is seen. Electronically Signed   By: Inez Catalina M.D.   On: 08/15/2018 11:31   Dg Chest Port 1 View  Result Date: 09/04/2018 CLINICAL DATA:  Swan-Ganz placement EXAM: PORTABLE CHEST 1 VIEW COMPARISON:  09/08/2018 FINDINGS: Interval placement of Swan-Ganz catheter with tip in the central right pulmonary artery. No pneumothorax. Endotracheal tube, right central line and NG tube are unchanged. Cardiomegaly with vascular congestion and bilateral lower lobe airspace opacities, left greater than right. Slight improvement in the right lung base airspace opacity. IMPRESSION: Interval  placement of right Swan-Ganz catheter with the tip in the central right pulmonary artery. No pneumothorax. Cardiomegaly with vascular congestion and bilateral lower lobe airspace opacities, slightly improved on the right. Electronically Signed   By: Rolm Baptise M.D.   On: 08/30/2018 10:01    Review of Systems  Unable to perform ROS: Intubated   Blood pressure 103/72, pulse 90, temperature 98.2 F (36.8 C), resp. rate (!) 21, height 6' (1.829 m), weight 104 kg, SpO2 96 %. Physical Exam  Constitutional: He appears well-developed and well-nourished.  Patient sedated on multiple pressors on the ventilator.  OG tube feeds  HENT:  Head: Normocephalic and atraumatic.  Eyes: Right eye exhibits no discharge. Left eye exhibits no discharge. No scleral icterus.  Pupils are equal  Neck: Normal range of motion. Neck supple. No JVD present. No tracheal deviation present. No thyromegaly present.  Cardiovascular: Regular rhythm and normal heart sounds.  Tachycardic  Respiratory: He is in respiratory distress (Full ventilator support). He has no wheezes. He has no rales.  GI: Soft. Bowel sounds are normal. He exhibits no distension and no mass. There is no tenderness. There is no rebound and no guarding.  He is completely sedated with no reaction to palpation.  Genitourinary:  Genitourinary Comments: Foley in place  Musculoskeletal: He exhibits edema (Trace to +1).  Lymphadenopathy:    He has no cervical adenopathy.  Neurological:  Sedated on the ventilator  Skin: Skin is dry. No rash noted. No  erythema. No pallor.  Psychiatric:  Could not be assessed sedated on the ventilator.    Assessment/Plan: Sepsis with shock  Probable cholecystitis/cholelithiasis, CBD 3 mm Cardiogenic shock Systolic heart failure with EF of 10% Acute renal failure  Acute liver failure Polysubstance abuse Noncompliance  Plan: Patient is acutely ill, on multiple pressors ongoing elevated liver function studies.  INR  is going up.  Will discuss with Dr. Barry Dienes.  Possible vitamin K to help with coagulation.  IR evaluation for drain placement.  I have talked with Dr. Vaughan Browner.  Earnstine Regal 08/30/2018, 8:40 AM

## 2018-08-31 ENCOUNTER — Inpatient Hospital Stay (HOSPITAL_COMMUNITY): Payer: Medicaid Other

## 2018-08-31 ENCOUNTER — Encounter (HOSPITAL_COMMUNITY): Payer: Self-pay | Admitting: Interventional Radiology

## 2018-08-31 HISTORY — PX: IR PERC CHOLECYSTOSTOMY: IMG2326

## 2018-08-31 LAB — GLUCOSE, CAPILLARY
GLUCOSE-CAPILLARY: 133 mg/dL — AB (ref 70–99)
GLUCOSE-CAPILLARY: 128 mg/dL — AB (ref 70–99)
Glucose-Capillary: 111 mg/dL — ABNORMAL HIGH (ref 70–99)
Glucose-Capillary: 134 mg/dL — ABNORMAL HIGH (ref 70–99)
Glucose-Capillary: 134 mg/dL — ABNORMAL HIGH (ref 70–99)
Glucose-Capillary: 139 mg/dL — ABNORMAL HIGH (ref 70–99)

## 2018-08-31 LAB — CBC
HCT: 38.1 % — ABNORMAL LOW (ref 39.0–52.0)
Hemoglobin: 12.6 g/dL — ABNORMAL LOW (ref 13.0–17.0)
MCH: 27.5 pg (ref 26.0–34.0)
MCHC: 33.1 g/dL (ref 30.0–36.0)
MCV: 83.2 fL (ref 80.0–100.0)
NRBC: 0.3 % — AB (ref 0.0–0.2)
Platelets: 143 10*3/uL — ABNORMAL LOW (ref 150–400)
RBC: 4.58 MIL/uL (ref 4.22–5.81)
RDW: 16.9 % — AB (ref 11.5–15.5)
WBC: 17.9 10*3/uL — AB (ref 4.0–10.5)

## 2018-08-31 LAB — COOXEMETRY PANEL
Carboxyhemoglobin: 1.7 % — ABNORMAL HIGH (ref 0.5–1.5)
Methemoglobin: 0.7 % (ref 0.0–1.5)
O2 SAT: 72.2 %
Total hemoglobin: 11.8 g/dL — ABNORMAL LOW (ref 12.0–16.0)

## 2018-08-31 LAB — POCT I-STAT 3, ART BLOOD GAS (G3+)
Acid-Base Excess: 9 mmol/L — ABNORMAL HIGH (ref 0.0–2.0)
BICARBONATE: 32.2 mmol/L — AB (ref 20.0–28.0)
O2 SAT: 100 %
PCO2 ART: 35.9 mmHg (ref 32.0–48.0)
PO2 ART: 153 mmHg — AB (ref 83.0–108.0)
Patient temperature: 36.9
TCO2: 33 mmol/L — AB (ref 22–32)
pH, Arterial: 7.56 — ABNORMAL HIGH (ref 7.350–7.450)

## 2018-08-31 LAB — COMPREHENSIVE METABOLIC PANEL
ALT: 3316 U/L — ABNORMAL HIGH (ref 0–44)
AST: 848 U/L — ABNORMAL HIGH (ref 15–41)
Albumin: 2.5 g/dL — ABNORMAL LOW (ref 3.5–5.0)
Alkaline Phosphatase: 118 U/L (ref 38–126)
Anion gap: 10 (ref 5–15)
BUN: 69 mg/dL — ABNORMAL HIGH (ref 6–20)
CHLORIDE: 99 mmol/L (ref 98–111)
CO2: 27 mmol/L (ref 22–32)
Calcium: 7.7 mg/dL — ABNORMAL LOW (ref 8.9–10.3)
Creatinine, Ser: 2.16 mg/dL — ABNORMAL HIGH (ref 0.61–1.24)
GFR calc non Af Amer: 36 mL/min — ABNORMAL LOW (ref >60)
GFR, EST AFRICAN AMERICAN: 42 mL/min — AB (ref >60)
Glucose, Bld: 150 mg/dL — ABNORMAL HIGH (ref 70–99)
POTASSIUM: 3.6 mmol/L (ref 3.5–5.1)
SODIUM: 136 mmol/L (ref 135–145)
Total Bilirubin: 9.7 mg/dL — ABNORMAL HIGH (ref 0.3–1.2)
Total Protein: 5.6 g/dL — ABNORMAL LOW (ref 6.5–8.1)

## 2018-08-31 LAB — PROTIME-INR
INR: 2.65
Prothrombin Time: 27.9 seconds — ABNORMAL HIGH (ref 11.4–15.2)

## 2018-08-31 LAB — MAGNESIUM: Magnesium: 2 mg/dL (ref 1.7–2.4)

## 2018-08-31 LAB — PHOSPHORUS: PHOSPHORUS: 3.1 mg/dL (ref 2.5–4.6)

## 2018-08-31 MED ORDER — AMIODARONE LOAD VIA INFUSION
150.0000 mg | Freq: Once | INTRAVENOUS | Status: AC
  Administered 2018-08-31: 150 mg via INTRAVENOUS
  Filled 2018-08-31: qty 83.34

## 2018-08-31 MED ORDER — LIDOCAINE HCL 1 % IJ SOLN
INTRAMUSCULAR | Status: AC
  Filled 2018-08-31: qty 20

## 2018-08-31 MED ORDER — SODIUM CHLORIDE 0.9 % IV SOLN
2.0000 g | Freq: Three times a day (TID) | INTRAVENOUS | Status: AC
  Administered 2018-08-31 – 2018-09-11 (×34): 2 g via INTRAVENOUS
  Filled 2018-08-31 (×34): qty 2

## 2018-08-31 MED ORDER — VITAMIN K1 10 MG/ML IJ SOLN
5.0000 mg | Freq: Once | INTRAVENOUS | Status: AC
  Administered 2018-08-31: 5 mg via INTRAVENOUS
  Filled 2018-08-31: qty 0.5

## 2018-08-31 MED ORDER — SODIUM CHLORIDE 0.9% FLUSH
5.0000 mL | Freq: Three times a day (TID) | INTRAVENOUS | Status: DC
  Administered 2018-08-31 – 2018-09-13 (×38): 5 mL

## 2018-08-31 MED ORDER — POTASSIUM CHLORIDE 10 MEQ/50ML IV SOLN
10.0000 meq | INTRAVENOUS | Status: AC
  Administered 2018-08-31 (×3): 10 meq via INTRAVENOUS
  Filled 2018-08-31 (×3): qty 50

## 2018-08-31 MED ORDER — AMIODARONE LOAD VIA INFUSION
150.0000 mg | Freq: Once | INTRAVENOUS | Status: AC
  Administered 2018-08-31: 150 mg via INTRAVENOUS

## 2018-08-31 MED ORDER — POTASSIUM CHLORIDE 10 MEQ/50ML IV SOLN
10.0000 meq | INTRAVENOUS | Status: AC
  Administered 2018-08-31: 10 meq via INTRAVENOUS
  Filled 2018-08-31: qty 50

## 2018-08-31 MED ORDER — ENOXAPARIN SODIUM 30 MG/0.3ML ~~LOC~~ SOLN
30.0000 mg | SUBCUTANEOUS | Status: DC
  Administered 2018-09-01 – 2018-09-02 (×2): 30 mg via SUBCUTANEOUS
  Filled 2018-08-31 (×2): qty 0.3

## 2018-08-31 MED ORDER — FUROSEMIDE 10 MG/ML IJ SOLN
60.0000 mg | Freq: Two times a day (BID) | INTRAMUSCULAR | Status: DC
  Administered 2018-08-31 – 2018-09-02 (×5): 60 mg via INTRAVENOUS
  Filled 2018-08-31 (×5): qty 6

## 2018-08-31 MED ORDER — IOPAMIDOL (ISOVUE-300) INJECTION 61%
INTRAVENOUS | Status: AC
  Administered 2018-08-31: 10 mL
  Filled 2018-08-31: qty 50

## 2018-08-31 MED ORDER — LIDOCAINE HCL 1 % IJ SOLN
INTRAMUSCULAR | Status: AC | PRN
  Administered 2018-08-31: 5 mL

## 2018-08-31 NOTE — Progress Notes (Signed)
Pt maintains afib RVR  in 130's. Paged HF MD x2 with no reply. CCM notified about pt increased HR and rhythm. No new orders at this time. Will continue to monitor. Pt BP stable.

## 2018-08-31 NOTE — Progress Notes (Signed)
Pt HR 150's afib RVR. MD contacted. New order obtained. Will continue to monitor. Bp stable.

## 2018-08-31 NOTE — Procedures (Signed)
Interventional Radiology Procedure Note  Procedure: Percutaneous cholecystostomy. 67F drain. Sample sent  Complications: None Recommendations:  - to gravity - Do not submerge - Routine drain care   Signed,  Yvone Neu. Loreta Ave, DO

## 2018-08-31 NOTE — Progress Notes (Addendum)
NAME:  Cody Patterson, MRN:  [id number], DOB:  1976/10/26, LOS: 4 ADMISSION DATE:  09/05/2018, CONSULTATION DATE:  09/09/2018 REFERRING MD:  Derek Mound, CHIEF COMPLAINT:  Cardiogenic shock   Brief History   42 year old man with history of 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 Presented to [facility] with increasing dyspnea. Acute decompensation with hypotension and severe respiratory distress requiring intubation. Transferred to [facility] for management of sepsis, advanced heart failure.  Growing H influenza in sputum.  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  Past Medical History  Known cardiomyopathy and drug abuse (THC and methamphetamines)  Significant Hospital Events   10/16- Intubated at [facility] 10/17- Worsening shock, maxed on pressors, right heart cath indicative of distributive shock, high output failure 10/18-Started on milrinone, lasix for reduced cardiac output. PA numbers show worsening CO and high wedge 10/19- Started making urine, pressors weaning down, continues on milrinone. Surgery consulted for cholecystitis 10/20- Mucus plug overnight requiring bag, lavage. Went into afib  Consults: date of consult/date signed off & final recs:  10/17- Cardiology  10/19- Surgery   Procedures (surgical and bedside):  10/16 [facility] R subclavian CVC  10/16 [facility] L femoral arterial line  10/17 PA catheter  Significant Diagnostic Tests:  Echocardiogram 10/17- Mild to moderate LV dilation with EF 20%, diffuse hypokinesis.   Restrictive diastolic function. Mildly dilated RV with mildly   decreased systolic function. No significant valvular   abnormalities  Abdominal ultrasound 08/29/2018- severe gallbladder thickening, sludge, cholelithiasis, small volume of  perihepatic, pericholecystic and perinephric free fluid.Marland Kitchen  UDS [facility] 10/16- Amphetamines, THC  Swan ganz 10/18 Initial numbers RA 10 PA 46/33 (41) PCWP 10 Thermo CO/CI  15.2/7.0 Fick 14.1/6.5 (co-ox 86%) SVR 231  Micro Data:  Bcx 10/17  Trach aspirate 10/17- H influenza  Antimicrobials:  Vancomycin 10/17 > Meropenen 10/17 > Aztreonam 10/17  Subjective:  Remains critically ill, sedated on ventilator Continues on pressors, milrinone  Required bag, Lavage due to secretions, mucous plug. Went into A. fib overnight. Objective   Blood pressure 113/78, pulse (!) 107, temperature 97.7 F (36.5 C), resp. rate 16, height 6' (1.829 m), weight 107.3 kg, SpO2 98 %. PAP: (35-44)/(25-34) 38/30 CVP:  [6 mmHg-13 mmHg] 8 mmHg PCWP:  [16 mmHg-28 mmHg] 16 mmHg CO:  [5.3 L/min-6.9 L/min] 5.6 L/min CI:  [2.4 L/min/m2-3.2 L/min/m2] 2.6 L/min/m2  Vent Mode: PRVC FiO2 (%):  [30 %-50 %] 40 % Set Rate:  [14 bmp-20 bmp] 14 bmp Vt Set:  [540 mL] 540 mL PEEP:  [5 cmH20] 5 cmH20 Plateau Pressure:  [18 cmH20-21 cmH20] 18 cmH20   Intake/Output Summary (Last 24 hours) at 08/31/2018 0742 Last data filed at 08/31/2018 0700 Gross per 24 hour  Intake 3744.34 ml  Output 3440 ml  Net 304.34 ml   Filed Weights   08/29/18 0500 08/30/18 0500 08/31/18 0500  Weight: 102.6 kg 104 kg 107.3 kg   Examination: Gen:      No acute distress HEENT:  EOMI, sclera anicteric, ET tube Neck:     No masses; no thyromegaly Lungs:    Clear to auscultation bilaterally; normal respiratory effort CV:         Regular rate and rhythm; no murmurs Abd:      + bowel sounds; soft, non-tender; no palpable masses, no distension Ext:    No edema; adequate peripheral  perfusion Skin:      Warm and dry; no rash Neuro: alert and oriented x 3 Psych: normal mood and affect Gen:      Sedated, unresponsive.  Resolved Hospital Problem list     Assessment & Plan:  Severe shock secondary to H. influenzae pneumonia in setting of  decompensated nonischemic cardiomyopathy  Shock- Cardiogenic, septic Severe cardiomyopathy, noncompliant V tach, A. fib Continue weaning pressors Milrinone decreased per cardiology.  Continue Lasix Amiodarone drip  Severe sepsis-H. influenzae pneumonia,?  Acute cholecystitis Broad antibiotic coverage, stress dose steroids Follow final cultures  Renal failure > improving Follow urine output and creatinine  Acute respiratory failure secondary to pulmonary edema Continue vent trials No weaning due to hemodynamic instability  Elevated LFTs, INR secondary to shock liver ? Cholecystitis Coagulopathy, elevated INR Monitor labs Surgery consulted.  Recommend perc drain by IR. Vit K 5 mg given yesterday. Repeat dose today.   Disposition / Summary of Today's Plan 08/31/18   Continue antibiotics, start pressure support weans Pressors, milrinone for management of septic, cardiogenic shock Consult IR for biliary drain.  Best Practice   Diet: Tube feeds Pain/Anxiety/Delirium protocol (if indicated): Fentanyl, versed gtt VAP protocol (if indicated):  Bundle in place. DVT prophylaxis: Lovenox. Holding for possible IR procedure GI prophylaxis: Famotidine Hyperglycemia protocol: monitor for hypoglycemia Mobility: bedrest Code Status: full Family Communication: Significant other updated at bedside.  He is married but estranged from his wife.  Labs personally reviewed.  CBC: reactive leukocytosis. Recent Labs  Lab 09/05/2018 1851 08/13/2018 0440  08/18/2018 1718 08/29/18 0330 08/29/18 1813 08/30/18 0310 08/31/18 0413  WBC 22.4* 22.9*  --   --  23.9*  --  20.0* 17.9*  HGB 14.3 14.0   < > 11.2* 12.5* 13.6 12.6* 12.6*  HCT 45.2 43.1   < > 33.0* 38.1* 40.0 38.5* 38.1*  MCV 87.6 86.9  --   --  85.6  --  84.1 83.2  PLT 207 215  --   --  193  --  160 143*   < > = values in this interval not displayed.    Basic Metabolic Panel: pending Recent Labs  Lab 08/18/2018 1851 08/16/2018 0440   08/30/2018 1718 08/18/2018 2027 08/29/18 0330 08/29/18 1813 08/30/18 0310 08/31/18 0413  NA 134* 135   < > 136  137 133* 131* 133* 133* 136  K 4.9 5.1   < > 5.4*  4.6 5.1 4.3 4.2 4.1 3.6  CL 101 98   < > 94*  97* 93* 97* 93* 95* 99  CO2 21* 21*  --  19* 19* 20*  --  24 27  GLUCOSE 64* 55*   < > 190*  167* 217* 201* 147* 147* 150*  BUN 20 28*   < > 35*  36* 38* 41* 51* 59* 69*  CREATININE 2.21* 2.69*   < > 3.90*  3.40* 3.97* 3.67* 3.60* 3.09* 2.16*  CALCIUM 8.2* 7.8*  --  6.8* 7.9* 6.7*  --  7.5* 7.7*  MG 2.1 1.8  --   --   --  1.8  --  2.2 2.0  PHOS 7.2* 6.9*  --   --   --  5.6*  --  4.6 3.1   < > = values in this interval not displayed.   GFR: Estimated Creatinine Clearance: 57.5 mL/min (A) (by C-G formula based on SCr of 2.16 mg/dL (H)). Recent Labs  Lab 09/03/2018 0440  08/24/2018 0830 08/30/2018 1145 09/02/2018 1849 08/29/18 0330 08/29/18 0744 08/30/18  0310 08/31/18 0413  PROCALCITON  --   --  27.23  --   --  22.85  --  14.68  --   WBC 22.9*  --   --   --   --  23.9*  --  20.0* 17.9*  LATICACIDVEN  --    < >  --  7.2* 8.7*  --  3.4* 3.1*  --    < > = values in this interval not displayed.    Liver Function Tests: Recent Labs  Lab 09/08/2018 1851 09-25-2018 1718 08/29/18 0841 08/30/18 0310 08/30/18 1054 08/31/18 0413  AST 3,397* 11,847* 8,606* 3,308*  --  848*  ALT 2,177* 6,537* 6,915* 5,164*  --  3,316*  ALKPHOS 114 112 135* 127*  --  118  BILITOT 5.3* 5.7* 6.9* 7.7* 7.9* 9.7*  PROT 6.4* 5.4* 5.6* 5.5*  --  5.6*  ALBUMIN 3.0* 2.8* 2.8* 2.6*  --  2.5*   No results for input(s): LIPASE, AMYLASE in the last 168 hours. No results for input(s): AMMONIA in the last 168 hours.  ABG    Component Value Date/Time   PHART 7.560 (H) 08/31/2018 0428   PCO2ART 35.9 08/31/2018 0428   PO2ART 153.0 (H) 08/31/2018 0428   HCO3 32.2 (H) 08/31/2018 0428   TCO2 33 (H) 08/31/2018 0428   ACIDBASEDEF 0.8 08/29/2018 0340   O2SAT 72.2 08/31/2018 0435     Coagulation  Profile: Recent Labs  Lab 08/21/2018 1851 08/30/18 0310 08/31/18 0413  INR 2.58 3.40 2.65    Cardiac Enzymes: Recent Labs  Lab 08/29/18 0841 08/30/18 0310  TROPONINI 1.12* 0.65*    HbA1C: No results found for: HGBA1C  CBG: Recent Labs  Lab 08/30/18 0313 08/30/18 0830 08/30/18 1133 08/30/18 2043 08/30/18 2357  GLUCAP 139* 133* 100* 138* 51*   The patient is critically ill with multiple organ system failure and requires high complexity decision making for assessment and support, frequent evaluation and titration of therapies, advanced monitoring, review of radiographic studies and interpretation of complex data.   Critical Care Time devoted to patient care services, exclusive of separately billable procedures, described in this note is 45 minutes.   Chilton Greathouse MD  Pulmonary and Critical Care Pager [phone] If no answer or after 3pm call: [phone] 08/31/2018, 7:43 AM

## 2018-08-31 NOTE — Progress Notes (Signed)
Notified cards that pt went into Afib, hemodynamics reviewed and labs. New order for potassium.

## 2018-08-31 NOTE — Progress Notes (Addendum)
Pt in afib RVR. Rate 130-150. HF MD notified. New order for 150 mg bolus of Amiodarone obtained. Will administer and continue to monitor. Pt BP stable.

## 2018-08-31 NOTE — Progress Notes (Addendum)
Patient ID: Cody Patterson, male   DOB: Dec 13, 1975, 42 y.o.   MRN: [id number]     Advanced Heart Failure Rounding Note  PCP-Cardiologist: No primary care provider on file.   Subjective:    Patient remains on milrinone 0.5, vasopressin 0.04, norepinephrine 12, amiodarone 30.  He went into atrial fibrillation with RVR overnight, HR remains in 110s currently.   Afebrile but on cooling blanket, WBCs 20 => 17.9.  H influenzae in sputum.  Abdominal US strongly suggestive of acute cholecystitis, not surgical candidate. INR 2.65 today.   Creatinine trending down at 3.09 => 2.16, good UOP.  AST/ALT trending down but bilirubin up.   Remains intubated and sedated.    CXR left base infiltrate.   Swan numbers CVP 12 PAP 35/28 CI 2.6 Co-ox 72%  Objective:   Weight Range: 107.3 kg Body mass index is 32.08 kg/m.   Vital Signs:   Temp:  [97.7 F (36.5 C)-99.7 F (37.6 C)] 97.7 F (36.5 C) (10/20 0700) Pulse Rate:  [84-120] 107 (10/20 0700) Resp:  [16-26] 16 (10/20 0700) BP: (104-122)/(60-86) 113/78 (10/20 0700) SpO2:  [92 %-98 %] 98 % (10/20 0700) Arterial Line BP: (98-124)/(63-82) 115/79 (10/20 0700) FiO2 (%):  [30 %-50 %] 40 % (10/20 0341) Weight:  [107.3 kg] 107.3 kg (10/20 0500) Last BM Date: 08/13/2018  Weight change: Filed Weights   08/29/18 0500 08/30/18 0500 08/31/18 0500  Weight: 102.6 kg 104 kg 107.3 kg    Intake/Output:   Intake/Output Summary (Last 24 hours) at 08/31/2018 0800 Last data filed at 08/31/2018 0700 Gross per 24 hour  Intake 3597.94 ml  Output 3290 ml  Net 307.94 ml      Physical Exam   General: Intubated/sedated Neck: JVP 8-9 cm, no thyromegaly or thyroid nodule.  Lungs: Decreased BS at bases CV: Nonpalpable PMI.  Heart mildly tachy irregular S1/S2, no S3/S4, no murmur.  No peripheral edema.   Abdomen: Soft, no hepatosplenomegaly, no distention.  Skin: Intact without lesions or rashes.  Neurologic: Sedated.  Extremities: No clubbing or  cyanosis.  HEENT: Normal.    Telemetry   Atrial fibrillation 110s, personally reviewed.   EKG    No new tracings.    Labs    CBC Recent Labs    08/30/18 0310 08/31/18 0413  WBC 20.0* 17.9*  HGB 12.6* 12.6*  HCT 38.5* 38.1*  MCV 84.1 83.2  PLT 160 185*   Basic Metabolic Panel Recent Labs    08/30/18 0310 08/31/18 0413  NA 133* 136  K 4.1 3.6  CL 95* 99  CO2 24 27  GLUCOSE 147* 150*  BUN 59* 69*  CREATININE 3.09* 2.16*  CALCIUM 7.5* 7.7*  MG 2.2 2.0  PHOS 4.6 3.1   Liver Function Tests Recent Labs    08/30/18 0310 08/30/18 1054 08/31/18 0413  AST 3,308*  --  848*  ALT 5,164*  --  3,316*  ALKPHOS 127*  --  118  BILITOT 7.7* 7.9* 9.7*  PROT 5.5*  --  5.6*  ALBUMIN 2.6*  --  2.5*   No results for input(s): LIPASE, AMYLASE in the last 72 hours. Cardiac Enzymes Recent Labs    08/29/18 0841 08/30/18 0310  TROPONINI 1.12* 0.65*    BNP: BNP (last 3 results) No results for input(s): BNP in the last 8760 hours.  ProBNP (last 3 results) No results for input(s): PROBNP in the last 8760 hours.   D-Dimer No results for input(s): DDIMER in the last 72 hours. Hemoglobin A1C No results  for input(s): HGBA1C in the last 72 hours. Fasting Lipid Panel No results for input(s): CHOL, HDL, LDLCALC, TRIG, CHOLHDL, LDLDIRECT in the last 72 hours. Thyroid Function Tests Recent Labs    08/29/2018 1145  TSH 1.223  T3FREE 1.4*    Other results:   Imaging    No results found.   Medications:     Scheduled Medications: . chlorhexidine gluconate (MEDLINE KIT)  15 mL Mouth Rinse BID  . Chlorhexidine Gluconate Cloth  6 each Topical Q0600  . Chlorhexidine Gluconate Cloth  6 each Topical Daily  . enoxaparin (LOVENOX) injection  30 mg Subcutaneous Q24H  . feeding supplement (VITAL HIGH PROTEIN)  1,000 mL Per Tube Q24H  . furosemide  60 mg Intravenous BID  . hydrocortisone sod succinate (SOLU-CORTEF) inj  50 mg Intravenous Q6H  . insulin aspart  0-9 Units  Subcutaneous Q4H  . mouth rinse  15 mL Mouth Rinse 10 times per day  . mupirocin ointment  1 application Nasal BID  . sodium chloride flush  10-40 mL Intracatheter Q12H  . vancomycin variable dose per unstable renal function (pharmacist dosing)   Does not apply See admin instructions    Infusions: . sodium chloride Stopped (08/20/2018 3614)  . sodium chloride Stopped (08/31/18 0535)  . sodium chloride 10 mL/hr at 08/31/18 0700  . amiodarone 30 mg/hr (08/31/18 0700)  . dexmedetomidine (PRECEDEX) IV infusion Stopped (08/29/18 1236)  . epinephrine Stopped (08/29/18 1003)  . famotidine (PEPCID) IV Stopped (08/30/18 1107)  . fentaNYL infusion INTRAVENOUS 325 mcg/hr (08/31/18 0700)  . meropenem (MERREM) IV Stopped (08/30/18 2258)  . midazolam (VERSED) infusion 4 mg/hr (08/31/18 0700)  . milrinone 0.5 mcg/kg/min (08/31/18 0700)  . norepinephrine (LEVOPHED) Adult infusion 12 mcg/min (08/31/18 0700)  . phytonadione (VITAMIN K) IV    . potassium chloride 10 mEq (08/31/18 0730)  . potassium chloride    . vancomycin 166.7 mL/hr at 08/31/18 0700  . vasopressin (PITRESSIN) infusion - *FOR SHOCK* 0.04 Units/min (08/31/18 0700)    PRN Medications: midazolam, sodium chloride flush    Patient Profile   Cody Patterson is a 42 y.o. 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   Presented to [facility] 10/16 with worsening dyspnea. Became hypotensive and with respiratory distress requiring pressor support and intubation. Transferred to [facility] for treatment and further evaluation.   Assessment/Plan   1. Shock: Mixed septic and cardiogenic shock.  CI 2.5 today with good co-ox at 76%.  MAP stable on current support, now off epinephrine and weaning down norepinephrine.  He has gone into atrial fibrillation with RVR.   - Slow wean of norepionephrine as tolerated.  - Continue vasopressin.   - Suspect primarily septic shock.  2. Acute on chronic systolic CHF:   Echocardiogram from [facility] 10/16: LV moderately dilated, severely impaired EF at 10%, moderate RV dysfunction, moderately elevated RVSP.  Nonischemic cardiomyopathy pre-existed this hospitalization (had cath at [facility]).  Suspect cardiomyopathy is due to substance abuse (amphetamines).  Good UOP with CVP 12 and CI 2.5 + co-ox 72%.  - As above, titrating down norepinephrine.  - With good CO and now afib/RVR, will decrease milrinone to 0.375.  - Lasix 60 mg IV bid today.  - He is not candidate for VAD or transplant with active substance abuse.  3. Acute respiratory failure: Remains intubated and sedated. Suspect PNA.  4. Polysubstance abuse: UDS + amphetamines and THC 5. ID: Septic shock.  Has H influenzae in sputum,  suspect PNA.  He also appears to have acute cholecystitis on abdominal US. - Continue meropenem and vancomycin.  - Surgery saw yesterday, would be poor surgical candidate for cholecystectomy.  Recommend percutaneous cholecystostomy tube (per CCM).  6. Transaminitis: Elevated LFTs.  Suspect ischemic hepatitis with shock + acute cholecystitis. Transaminases falling but tbili rising, likely in setting of acute cholecystitis.  7. Ileus: KUB 08/16/2018 with large illeus in the setting of stool coming through OG tube.  - Improved. 8. Atrial fibrillation: Patient went into atrial fibrillation with RVR overnight.   - INR elevated with liver failure, will not add heparin gtt.  - Will increase amiodarone gtt to 60 mg/hr.  - Decrease milrinone as above.   Medication concerns reviewed with patient and pharmacy team. Barriers identified: Non-compliance, substance abuse.   CRITICAL CARE Performed by: Loralie Champagne  Total critical care time: 40 minutes  Critical care time was exclusive of separately billable procedures and treating other patients.  Critical care was necessary to treat or prevent imminent or life-threatening deterioration.  Critical care was time spent personally by me on the  following activities: development of treatment plan with patient and/or surrogate as well as nursing, discussions with consultants, evaluation of patient's response to treatment, examination of patient, obtaining history from patient or surrogate, ordering and performing treatments and interventions, ordering and review of laboratory studies, ordering and review of radiographic studies, pulse oximetry and re-evaluation of patient's condition.   Length of Stay: 4  Loralie Champagne, MD  08/31/2018, 8:00 AM  Advanced Heart Failure Team Pager [phone] (M-F; 7a - 4p)  Please contact [facility] for night-coverage after hours (4p -7a ) and weekends on [email or web address]

## 2018-08-31 NOTE — Progress Notes (Signed)
Pharmacy Antibiotic Note  Cody Patterson is a 42 y.o. male admitted on 2018/09/01 with sepsis.  Pharmacy has been consulted for vancomycin and meropenem dosing.  SCr trending down today.  Plan: Vancomycin 1250mg  IV every 24 hours.  Goal trough 15-20 mcg/mL.  Increase meropenem to 2g q 8 hrs. F/u cultures, clinical course.  Height: 6' (182.9 cm) Weight: 236 lb 8.9 oz (107.3 kg) IBW/kg (Calculated) : 77.6  Temp (24hrs), Avg:98.7 F (37.1 C), Min:97.7 F (36.5 C), Max:99.7 F (37.6 C)  Recent Labs  Lab 09-01-2018 1851  09/06/2018 0440 08/15/2018 0507  08/25/2018 1145  08/27/2018 1849 09/07/2018 2027 08/29/18 0330 08/29/18 0744 08/29/18 1813 08/30/18 0310 08/31/18 0413  WBC 22.4*  --  22.9*  --   --   --   --   --   --  23.9*  --   --  20.0* 17.9*  CREATININE 2.21*  --  2.69*  --    < >  --    < >  --  3.97* 3.67*  --  3.60* 3.09* 2.16*  LATICACIDVEN  --    < >  --  5.5*  --  7.2*  --  8.7*  --   --  3.4*  --  3.1*  --   VANCORANDOM  --   --   --   --   --   --   --   --   --   --   --   --  5  --    < > = values in this interval not displayed.    Estimated Creatinine Clearance: 57.5 mL/min (A) (by C-G formula based on SCr of 2.16 mg/dL (H)).    Allergies  Allergen Reactions  . Amoxicillin Swelling  . Penicillins Swelling   Antimicrobials this admission:  Vanc 10/17 >> [facility] 10/17 >>   Dose adjustments this admission:  10/19 VR 5> start [id number]   Microbiology results:  10/17 Bcx x 2: ngtd 10/17: Ucx: ngtd 10/17: sputum: mod Hflu B lactamse + 10/16: mrsa positive   Thank you for allowing pharmacy to be a part of this patient's care.  Jenetta Downer, [facility] Clinical Pharmacist Phone [phone]  08/31/2018 12:37 PM

## 2018-08-31 NOTE — Progress Notes (Signed)
Pt went into Afib at 3:14 Am, verified by 12 lead EKG, CCM notified. No new orders.

## 2018-08-31 NOTE — Progress Notes (Signed)
eLink Physician-Brief Progress Note Patient Name: Cody Patterson DOB: 06/18/1976 MRN: [id number]   Date of Service  08/31/2018  HPI/Events of Note  Alkalemia.  PH 7.56 on ABG  eICU Interventions  RR decreased from 20 to 14 on vent     Intervention Category Major Interventions: Acid-Base disturbance - evaluation and management  Henry Russel, P 08/31/2018, 6:12 AM

## 2018-08-31 NOTE — Progress Notes (Signed)
Subjective/Chief Complaint: sepsis  Pt on vent and pressors   Objective: Vital signs in last 24 hours: Temp:  [97.7 F (36.5 C)-99.7 F (37.6 C)] 97.7 F (36.5 C) (10/20 0700) Pulse Rate:  [84-120] 107 (10/20 0700) Resp:  [16-26] 16 (10/20 0700) BP: (104-122)/(60-86) 113/78 (10/20 0700) SpO2:  [92 %-98 %] 98 % (10/20 0700) Arterial Line BP: (98-124)/(63-82) 115/79 (10/20 0700) FiO2 (%):  [30 %-50 %] 40 % (10/20 0341) Weight:  [107.3 kg] 107.3 kg (10/20 0500) Last BM Date: 09/11/2018  Intake/Output from previous day: 10/19 0701 - 10/20 0700 In: 3744.3 [I.V.:2749.4; NG/GT:460; IV Piggyback:535] Out: 3440 [Urine:3440] Intake/Output this shift: No intake/output data recorded.  GI: soft NT ND   On vent  Sedated   Lab Results:  Recent Labs    08/30/18 0310 08/31/18 0413  WBC 20.0* 17.9*  HGB 12.6* 12.6*  HCT 38.5* 38.1*  PLT 160 143*   BMET Recent Labs    08/30/18 0310 08/31/18 0413  NA 133* 136  K 4.1 3.6  CL 95* 99  CO2 24 27  GLUCOSE 147* 150*  BUN 59* 69*  CREATININE 3.09* 2.16*  CALCIUM 7.5* 7.7*   PT/INR Recent Labs    08/30/18 0310 08/31/18 0413  LABPROT 33.9* 27.9*  INR 3.40 2.65   ABG Recent Labs    08/30/18 0739 08/31/18 0428  PHART 7.488* 7.560*  HCO3 28.9* 32.2*    Studies/Results: US Abdomen Complete  Result Date: 08/29/2018 CLINICAL DATA:  42 year old male with renal failure, elevated LFTs. EXAM: ABDOMEN ULTRASOUND COMPLETE COMPARISON:  CT Abdomen and Pelvis 07/12/2014. FINDINGS: Gallbladder: Floating gallstone measuring 19 millimeters is noted on image 4. There is abundant superimposed dependent gallbladder sludge. There is moderate to severe gallbladder wall thickening up to 13 millimeters. There is trace pericholecystic fluid. However, no sonographic Murphy sign was elicited. Common bile duct: Diameter: 3 millimeters, normal. Liver: Trace perihepatic free fluid. No focal lesion identified. Within normal limits in parenchymal  echogenicity. Portal vein is patent on color Doppler imaging with normal direction of blood flow towards the liver. IVC: Incompletely visualized due to overlying bowel gas, visualized portions within normal limits. Pancreas: Visualized portion unremarkable. Spleen: Size and appearance within normal limits. Right Kidney: Length: 14.4 centimeters. Echogenicity within normal limits. No mass or hydronephrosis visualized. Small volume of right perinephric free fluid (image 88). Left Kidney: Length: 13.2 centimeters. Echogenicity within normal limits. No mass or hydronephrosis visualized. Small volume of left perinephric fluid suspected on image 105. Abdominal aorta: No aneurysm visualized. Other findings: None. IMPRESSION: 1. Abnormal gallbladder with severe wall thickening, sludge, and cholelithiasis. Despite absent sonographic Murphy sign ACUTE CHOLECYSTITIS should be considered. 2. Small volume of perihepatic and pericholecystic free fluid. No evidence of bile duct obstruction. 3. Small volume of bilateral perinephric free fluid which may relate to renal failure. No hydronephrosis and otherwise negative ultrasound appearance of both kidneys. Electronically Signed   By: Odessa Fleming M.D.   On: 08/29/2018 11:19   Dg Chest Port 1 View  Result Date: 08/30/2018 CLINICAL DATA:  Acute respiratory failure. EXAM: PORTABLE CHEST 1 VIEW COMPARISON:  One-view chest x-ray 08/29/2018 FINDINGS: Heart is enlarged. Endotracheal tube is scratched at the endotracheal tube terminates 6 cm above the carina, in satisfactory position. The tip of a Swan-Ganz catheter is in the main pulmonary outflow tract. The side port of the NG tube is in the stomach. For bladder pads are in place. The warming/cooling blanket is in place. Mild edema is stable. Left basilar  airspace disease is noted. IMPRESSION: 1. Cardiomegaly with mild edema. 2. Left basilar airspace disease likely reflects atelectasis. Infection is not excluded. 3. Support apparatus is  stable and in satisfactory position. Electronically Signed   By: Marin Roberts M.D.   On: 08/30/2018 08:26    Anti-infectives: Anti-infectives (From admission, onward)   Start     Dose/Rate Route Frequency Ordered Stop   08/30/18 0730  vancomycin (VANCOCIN) 1,250 mg in sodium chloride 0.9 % 250 mL IVPB  Status:  Discontinued     1,250 mg 166.7 mL/hr over 90 Minutes Intravenous Every 24 hours 08/30/18 0728 08/30/18 0803   08/30/18 0600  vancomycin (VANCOCIN) 1,250 mg in sodium chloride 0.9 % 250 mL IVPB     1,250 mg 166.7 mL/hr over 90 Minutes Intravenous Every 24 hours 08/30/18 0500     09/05/2018 1800  vancomycin (VANCOCIN) IVPB 750 mg/150 ml premix  Status:  Discontinued     750 mg 150 mL/hr over 60 Minutes Intravenous Every 12 hours 08/26/2018 0446 08/14/2018 1445   09/11/2018 1445  vancomycin variable dose per unstable renal function (pharmacist dosing)      Does not apply See admin instructions 08/26/2018 1445     09/05/2018 1000  meropenem (MERREM) 2 g in sodium chloride 0.9 % 100 mL IVPB     2 g 200 mL/hr over 30 Minutes Intravenous Every 12 hours 08/13/2018 0831     09/07/2018 0700  aztreonam (AZACTAM) 1 g in sodium chloride 0.9 % 100 mL IVPB  Status:  Discontinued     1 g 200 mL/hr over 30 Minutes Intravenous Every 8 hours 08/18/2018 0655 08/29/2018 0831   08/17/2018 0530  vancomycin (VANCOCIN) 1,500 mg in sodium chloride 0.9 % 500 mL IVPB     1,500 mg 250 mL/hr over 120 Minutes Intravenous  Once 08/16/2018 0446 08/29/18 0716   08/31/2018 0515  aztreonam (AZACTAM) 1 g in sodium chloride 0.9 % 100 mL IVPB  Status:  Discontinued     1 g 200 mL/hr over 30 Minutes Intravenous Every 8 hours 08/14/2018 0446 08/16/2018 0655      Assessment/Plan: Sepsis with shock  Probable cholecystitis/cholelithiasis, CBD 3 mm Cardiogenic shock Systolic heart failure with EF of 10% Acute renal failure  Acute liver failure Polysubstance abuse Noncompliance   Perc drainage only option given overall condition     LOS: 4 days    Maisie Fus A Ioana Louks 08/31/2018

## 2018-08-31 NOTE — H&P (Signed)
Chief Complaint: Acute cholecystitis  Referring Physician(s): Mannam, Praveen  Supervising Physician: Gilmer Mor  Patient Status: [facility] - In-pt  History of Present Illness: Cody Patterson is a 42 y.o. male who is critically ill on milrinone 0.5, vasopressin 0.04, norepinephrine 12, amiodarone 30.    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  He presented to [facility] with increasing dyspnea.   He decompensated quickly and required intubation and he was transferred to [facility] for ongoing management.  Imaging workup has revealed an abnormal gallbladder with severe wall thickening, sludge, and cholelithiasis  His LFTs are severely abnormal =  AST 848 ALT 3316 T Bili 9.7 INR 2.65  He went into atrial fibrillation with RVR overnight, HR remains in 110s currently.   Past Medical History:  Diagnosis Date  . Cardiomyopathy (HCC)   . Heart failure (HCC)   . Polysubstance abuse (HCC)    THC and Amphetamines    Allergies: Amoxicillin and Penicillins  Medications: Prior to Admission medications   Not on File     No family history on file.  Social History   Socioeconomic History  . Marital status: Legally Separated    Spouse name: Not on file  . Number of children: Not on file  . Years of education: Not on file  . Highest education level: Not on file  Occupational History  . Not on file  Social Needs  . Financial resource strain: Not on file  . Food insecurity:    Worry: Not on file    Inability: Not on file  . Transportation needs:    Medical: Not on file    Non-medical: Not on file  Tobacco Use  . Smoking status: Not on file  Substance and Sexual Activity  . Alcohol use: Not on file  . Drug use: Yes    Types: Methamphetamines, Marijuana    Comment: UDS positive on admission to [place]  . Sexual activity: Not on file  Lifestyle  . Physical activity:    Days per week: Not on file    Minutes per  session: Not on file  . Stress: Not on file  Relationships  . Social connections:    Talks on phone: Not on file    Gets together: Not on file    Attends religious service: Not on file    Active member of club or organization: Not on file    Attends meetings of clubs or organizations: Not on file    Relationship status: Not on file  Other Topics Concern  . Not on file  Social History Narrative  . Not on file    Review of Systems  Unable to perform ROS: Intubated   Vital Signs: BP 113/78   Pulse (!) 107   Temp 97.7 F (36.5 C)   Resp 16   Ht 6' (1.829 m)   Wt 107.3 kg   SpO2 97%   BMI 32.08 kg/m   Physical Exam  Constitutional:  Critically ill appearing, intubated, on pressors.  HENT:  Head: Normocephalic.  Cardiovascular:  Tachy - 107 = Afib  Pulmonary/Chest: He has no wheezes.  Intubated on Vent  Abdominal: Soft. He exhibits no distension.  Skin:  + jaundice  Psychiatric:  Unable to assess  Vitals reviewed.   Imaging: Dg Abd 1 View  Result Date: September 27, 2018 CLINICAL DATA:  Abdominal distention. EXAM: ABDOMEN - 1 VIEW COMPARISON:  None. FINDINGS: NG tube is in the  mid stomach. Moderate gas and stool within the colon. No evidence of bowel obstruction. No organomegaly or visible free air. IMPRESSION: NG tube tip in the mid stomach. Moderate gas and stool within the colon. Electronically Signed   By: Charlett Nose M.D.   On: 09/07/2018 08:48   US Abdomen Complete  Result Date: 08/29/2018 CLINICAL DATA:  42 year old male with renal failure, elevated LFTs. EXAM: ABDOMEN ULTRASOUND COMPLETE COMPARISON:  CT Abdomen and Pelvis 07/12/2014. FINDINGS: Gallbladder: Floating gallstone measuring 19 millimeters is noted on image 4. There is abundant superimposed dependent gallbladder sludge. There is moderate to severe gallbladder wall thickening up to 13 millimeters. There is trace pericholecystic fluid. However, no sonographic Murphy sign was elicited. Common bile duct:  Diameter: 3 millimeters, normal. Liver: Trace perihepatic free fluid. No focal lesion identified. Within normal limits in parenchymal echogenicity. Portal vein is patent on color Doppler imaging with normal direction of blood flow towards the liver. IVC: Incompletely visualized due to overlying bowel gas, visualized portions within normal limits. Pancreas: Visualized portion unremarkable. Spleen: Size and appearance within normal limits. Right Kidney: Length: 14.4 centimeters. Echogenicity within normal limits. No mass or hydronephrosis visualized. Small volume of right perinephric free fluid (image 88). Left Kidney: Length: 13.2 centimeters. Echogenicity within normal limits. No mass or hydronephrosis visualized. Small volume of left perinephric fluid suspected on image 105. Abdominal aorta: No aneurysm visualized. Other findings: None. IMPRESSION: 1. Abnormal gallbladder with severe wall thickening, sludge, and cholelithiasis. Despite absent sonographic Murphy sign ACUTE CHOLECYSTITIS should be considered. 2. Small volume of perihepatic and pericholecystic free fluid. No evidence of bile duct obstruction. 3. Small volume of bilateral perinephric free fluid which may relate to renal failure. No hydronephrosis and otherwise negative ultrasound appearance of both kidneys. Electronically Signed   By: Odessa Fleming M.D.   On: 08/29/2018 11:19   Dg Chest Port 1 View  Result Date: 08/30/2018 CLINICAL DATA:  Acute respiratory failure. EXAM: PORTABLE CHEST 1 VIEW COMPARISON:  One-view chest x-ray 08/29/2018 FINDINGS: Heart is enlarged. Endotracheal tube is scratched at the endotracheal tube terminates 6 cm above the carina, in satisfactory position. The tip of a Swan-Ganz catheter is in the main pulmonary outflow tract. The side port of the NG tube is in the stomach. For bladder pads are in place. The warming/cooling blanket is in place. Mild edema is stable. Left basilar airspace disease is noted. IMPRESSION: 1.  Cardiomegaly with mild edema. 2. Left basilar airspace disease likely reflects atelectasis. Infection is not excluded. 3. Support apparatus is stable and in satisfactory position. Electronically Signed   By: Marin Roberts M.D.   On: 08/30/2018 08:26   Dg Chest Port 1 View  Result Date: 08/29/2018 CLINICAL DATA:  Check endotracheal tube placement EXAM: PORTABLE CHEST 1 VIEW COMPARISON:  09/07/2018 FINDINGS: Endotracheal tube, Swan-Ganz catheter, right subclavian central line and nasogastric catheter are noted and stable in appearance. Cardiac shadow is enlarged but stable. Stable left basilar infiltrate is noted when compare with the previous day. No new focal abnormality is noted. IMPRESSION: Stable left basilar infiltrate. Tubes and lines as described. Electronically Signed   By: Alcide Clever M.D.   On: 08/29/2018 08:09   Dg Chest Port 1 View  Result Date: 08/16/2018 CLINICAL DATA:  Recent hypoxia EXAM: PORTABLE CHEST 1 VIEW COMPARISON:  10/17/9 FINDINGS: Cardiac shadow remains enlarged. Endotracheal tube and nasogastric catheter are stable. Swan-Ganz catheter and right subclavian catheter are stable as well. Lungs are well aerated bilaterally. Patchy left retrocardiac density is  noted. Stable central vascular congestion is noted without significant interstitial edema. No pneumothorax is noted. IMPRESSION: Stable vascular congestion and left basilar infiltrate. No new focal abnormality is seen. Electronically Signed   By: Alcide Clever M.D.   On: 08/24/2018 11:31   Dg Chest Port 1 View  Result Date: 09/01/2018 CLINICAL DATA:  Swan-Ganz placement EXAM: PORTABLE CHEST 1 VIEW COMPARISON:  08/14/2018 FINDINGS: Interval placement of Swan-Ganz catheter with tip in the central right pulmonary artery. No pneumothorax. Endotracheal tube, right central line and NG tube are unchanged. Cardiomegaly with vascular congestion and bilateral lower lobe airspace opacities, left greater than right. Slight  improvement in the right lung base airspace opacity. IMPRESSION: Interval placement of right Swan-Ganz catheter with the tip in the central right pulmonary artery. No pneumothorax. Cardiomegaly with vascular congestion and bilateral lower lobe airspace opacities, slightly improved on the right. Electronically Signed   By: Charlett Nose M.D.   On: 09/08/2018 10:01   Dg Chest Port 1 View  Result Date: 08/30/2018 CLINICAL DATA:  Check endotracheal tube placement EXAM: PORTABLE CHEST 1 VIEW COMPARISON:  10/09/2018 FINDINGS: Endotracheal tube, right subclavian central line and nasogastric catheter are again noted and stable. Cardiac shadow remains enlarged. Patchy increased density is noted in the right lung base slightly increased when compared with the prior exam. Mild central vascular congestion remains. No bony abnormality is noted. IMPRESSION: Stable vascular congestion. Slight increase in the degree of right-sided asymmetric opacity when compared with the prior exam. Again this may represent early infiltrate or focal edema. Electronically Signed   By: Alcide Clever M.D.   On: 08/26/2018 09:38   Dg Chest Port 1 View  Result Date: 10/07/2018 CLINICAL DATA:  Heart failure EXAM: PORTABLE CHEST 1 VIEW COMPARISON:  Chest x-ray Sep 14, 2018 at [facility] FINDINGS: Endotracheal tube tip is 2.4 cm above the carina. An orogastric tube and side-port reaches the stomach. Right subclavian central line with tip at the upper cavoatrial junction. Cardiomegaly. Indistinct, patchy right-sided lung opacity. No Kerley lines, effusion, or pneumothorax. IMPRESSION: 1. Unremarkable hardware as described. 2. Cardiomegaly. 3. Asymmetric right-sided lung opacity could be asymmetric edema, aspiration, or pneumonia. Electronically Signed   By: Marnee Spring M.D.   On: 10/03/2018 20:21    Labs:  CBC: Recent Labs    08/30/2018 0440  08/29/18 0330 08/29/18 1813 08/30/18 0310 08/31/18 0413  WBC 22.9*  --  23.9*  --   20.0* 17.9*  HGB 14.0   < > 12.5* 13.6 12.6* 12.6*  HCT 43.1   < > 38.1* 40.0 38.5* 38.1*  PLT 215  --  193  --  160 143*   < > = values in this interval not displayed.    COAGS: Recent Labs    14-Sep-2018 1851 08/30/18 0310 08/31/18 0413  INR 2.58 3.40 2.65  APTT 70*  --   --     BMP: Recent Labs    09/01/2018 2027 08/29/18 0330 08/29/18 1813 08/30/18 0310 08/31/18 0413  NA 133* 131* 133* 133* 136  K 5.1 4.3 4.2 4.1 3.6  CL 93* 97* 93* 95* 99  CO2 19* 20*  --  24 27  GLUCOSE 217* 201* 147* 147* 150*  BUN 38* 41* 51* 59* 69*  CALCIUM 7.9* 6.7*  --  7.5* 7.7*  CREATININE 3.97* 3.67* 3.60* 3.09* 2.16*  GFRNONAA 17* 19*  --  24* 36*  GFRAA 20* 22*  --  27* 42*    LIVER FUNCTION TESTS: Recent Labs  September 24, 2018 1718 08/29/18 0841 08/30/18 0310 08/30/18 1054 08/31/18 0413  BILITOT 5.7* 6.9* 7.7* 7.9* 9.7*  AST 11,847* 8,606* 3,308*  --  848*  ALT 6,537* 6,915* 5,164*  --  3,316*  ALKPHOS 112 135* 127*  --  118  PROT 5.4* 5.6* 5.5*  --  5.6*  ALBUMIN 2.8* 2.8* 2.6*  --  2.5*    TUMOR MARKERS: No results for input(s): AFPTM, CEA, CA199, CHROMGRNA in the last 8760 hours.  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  Now with acute cholecystitis.  Risks and benefits discussed with the patient's girlfriend including, but not limited to bleeding, infection, gallbladder perforation, bile leak, sepsis or even death.  All of the patient's girlfriends questions were answered and is agreeable for IR to proceed.  Consent obtained from girlfriend. It is signed and in chart.  She understands the high risk of bleeding due to his coagulopathy. She agrees benefit of cholecystostomy drain outweighs risks of bleeding.  Will proceed with Image guided percutaneous cholecystostomy today by Dr. Loreta Ave.  Thank you for this interesting consult.  I greatly enjoyed meeting Carmen Vallecillo and look forward to participating in  their care.  A copy of this report was sent to the requesting provider on this date.  Electronically Signed: Gwynneth Macleod, PA-C   08/31/2018, 9:06 AM      I spent a total of 40 Minutes in face to face in clinical consultation, greater than 50% of which was counseling/coordinating care for acute cholecystitis.

## 2018-08-31 NOTE — Progress Notes (Signed)
CSW acknowledges consult for Advance Directives. For further assistance with this matte please consult chaplin services.  At this time there are no further CSW needs. CSW will sign off if new need arises please reconsult.    Cody Patterson, MSW, LCSW-A Emergency Department Clinical Social Worker [phone]

## 2018-09-01 ENCOUNTER — Inpatient Hospital Stay (HOSPITAL_COMMUNITY): Payer: Medicaid Other

## 2018-09-01 DIAGNOSIS — J9600 Acute respiratory failure, unspecified whether with hypoxia or hypercapnia: Secondary | ICD-10-CM

## 2018-09-01 DIAGNOSIS — K7200 Acute and subacute hepatic failure without coma: Secondary | ICD-10-CM

## 2018-09-01 LAB — COMPREHENSIVE METABOLIC PANEL
ALBUMIN: 2.4 g/dL — AB (ref 3.5–5.0)
ALK PHOS: 117 U/L (ref 38–126)
ALT: 2214 U/L — ABNORMAL HIGH (ref 0–44)
AST: 267 U/L — AB (ref 15–41)
Anion gap: 6 (ref 5–15)
BILIRUBIN TOTAL: 10.8 mg/dL — AB (ref 0.3–1.2)
BUN: 71 mg/dL — AB (ref 6–20)
CALCIUM: 7.8 mg/dL — AB (ref 8.9–10.3)
CO2: 29 mmol/L (ref 22–32)
Chloride: 103 mmol/L (ref 98–111)
Creatinine, Ser: 1.92 mg/dL — ABNORMAL HIGH (ref 0.61–1.24)
GFR calc Af Amer: 49 mL/min — ABNORMAL LOW (ref >60)
GFR calc non Af Amer: 42 mL/min — ABNORMAL LOW (ref >60)
GLUCOSE: 138 mg/dL — AB (ref 70–99)
Potassium: 3.6 mmol/L (ref 3.5–5.1)
Sodium: 138 mmol/L (ref 135–145)
TOTAL PROTEIN: 5.4 g/dL — AB (ref 6.5–8.1)

## 2018-09-01 LAB — BASIC METABOLIC PANEL
ANION GAP: 8 (ref 5–15)
ANION GAP: 8 (ref 5–15)
Anion gap: 12 (ref 5–15)
BUN: 63 mg/dL — AB (ref 6–20)
BUN: 63 mg/dL — AB (ref 6–20)
BUN: 70 mg/dL — AB (ref 6–20)
CHLORIDE: 104 mmol/L (ref 98–111)
CHLORIDE: 102 mmol/L (ref 98–111)
CHLORIDE: 104 mmol/L (ref 98–111)
CO2: 25 mmol/L (ref 22–32)
CO2: 27 mmol/L (ref 22–32)
CO2: 29 mmol/L (ref 22–32)
Calcium: 7.5 mg/dL — ABNORMAL LOW (ref 8.9–10.3)
Calcium: 7.6 mg/dL — ABNORMAL LOW (ref 8.9–10.3)
Calcium: 7.9 mg/dL — ABNORMAL LOW (ref 8.9–10.3)
Creatinine, Ser: 1.65 mg/dL — ABNORMAL HIGH (ref 0.61–1.24)
Creatinine, Ser: 1.65 mg/dL — ABNORMAL HIGH (ref 0.61–1.24)
Creatinine, Ser: 1.91 mg/dL — ABNORMAL HIGH (ref 0.61–1.24)
GFR calc Af Amer: 49 mL/min — ABNORMAL LOW (ref >60)
GFR calc Af Amer: 59 mL/min — ABNORMAL LOW (ref >60)
GFR calc Af Amer: 59 mL/min — ABNORMAL LOW (ref >60)
GFR calc non Af Amer: 42 mL/min — ABNORMAL LOW (ref >60)
GFR calc non Af Amer: 50 mL/min — ABNORMAL LOW (ref >60)
GFR, EST NON AFRICAN AMERICAN: 50 mL/min — AB (ref >60)
GLUCOSE: 128 mg/dL — AB (ref 70–99)
GLUCOSE: 139 mg/dL — AB (ref 70–99)
Glucose, Bld: 125 mg/dL — ABNORMAL HIGH (ref 70–99)
POTASSIUM: 3.6 mmol/L (ref 3.5–5.1)
POTASSIUM: 3.5 mmol/L (ref 3.5–5.1)
Potassium: 3.4 mmol/L — ABNORMAL LOW (ref 3.5–5.1)
SODIUM: 141 mmol/L (ref 135–145)
SODIUM: 141 mmol/L (ref 135–145)
SODIUM: 137 mmol/L (ref 135–145)

## 2018-09-01 LAB — COOXEMETRY PANEL
CARBOXYHEMOGLOBIN: 1.7 % — AB (ref 0.5–1.5)
Methemoglobin: 0.9 % (ref 0.0–1.5)
O2 Saturation: 72.1 %
Total hemoglobin: 13.6 g/dL (ref 12.0–16.0)

## 2018-09-01 LAB — POCT I-STAT 3, ART BLOOD GAS (G3+)
Acid-Base Excess: 6 mmol/L — ABNORMAL HIGH (ref 0.0–2.0)
Bicarbonate: 31.3 mmol/L — ABNORMAL HIGH (ref 20.0–28.0)
O2 SAT: 99 %
PH ART: 7.44 (ref 7.350–7.450)
PO2 ART: 130 mmHg — AB (ref 83.0–108.0)
Patient temperature: 36.3
TCO2: 33 mmol/L — ABNORMAL HIGH (ref 22–32)
pCO2 arterial: 45.9 mmHg (ref 32.0–48.0)

## 2018-09-01 LAB — GLUCOSE, CAPILLARY
GLUCOSE-CAPILLARY: 115 mg/dL — AB (ref 70–99)
GLUCOSE-CAPILLARY: 117 mg/dL — AB (ref 70–99)
GLUCOSE-CAPILLARY: 118 mg/dL — AB (ref 70–99)
GLUCOSE-CAPILLARY: 122 mg/dL — AB (ref 70–99)
GLUCOSE-CAPILLARY: 127 mg/dL — AB (ref 70–99)
Glucose-Capillary: 51 mg/dL — ABNORMAL LOW (ref 70–99)
Glucose-Capillary: 145 mg/dL — ABNORMAL HIGH (ref 70–99)
Glucose-Capillary: 111 mg/dL — ABNORMAL HIGH (ref 70–99)

## 2018-09-01 LAB — CBC
HCT: 39.7 % (ref 39.0–52.0)
HEMOGLOBIN: 12.9 g/dL — AB (ref 13.0–17.0)
MCH: 27.6 pg (ref 26.0–34.0)
MCHC: 32.5 g/dL (ref 30.0–36.0)
MCV: 85 fL (ref 80.0–100.0)
Platelets: 109 10*3/uL — ABNORMAL LOW (ref 150–400)
RBC: 4.67 MIL/uL (ref 4.22–5.81)
RDW: 17.9 % — ABNORMAL HIGH (ref 11.5–15.5)
WBC: 14.9 10*3/uL — ABNORMAL HIGH (ref 4.0–10.5)
nRBC: 0.3 % — ABNORMAL HIGH (ref 0.0–0.2)

## 2018-09-01 LAB — PHOSPHORUS: Phosphorus: 4.3 mg/dL (ref 2.5–4.6)

## 2018-09-01 LAB — MAGNESIUM
MAGNESIUM: 2 mg/dL (ref 1.7–2.4)
Magnesium: 2.1 mg/dL (ref 1.7–2.4)

## 2018-09-01 LAB — LACTIC ACID, PLASMA: Lactic Acid, Venous: 1.5 mmol/L (ref 0.5–1.9)

## 2018-09-01 LAB — PROTIME-INR
INR: 2.33
Prothrombin Time: 25.3 seconds — ABNORMAL HIGH (ref 11.4–15.2)

## 2018-09-01 MED ORDER — POTASSIUM CHLORIDE 20 MEQ/15ML (10%) PO SOLN
40.0000 meq | ORAL | Status: AC
  Administered 2018-09-01 (×2): 40 meq via ORAL
  Filled 2018-09-01 (×2): qty 30

## 2018-09-01 MED ORDER — CHLORHEXIDINE GLUCONATE 0.12 % MT SOLN
OROMUCOSAL | Status: AC
  Administered 2018-09-01: 15 mL via OROMUCOSAL
  Filled 2018-09-01: qty 15

## 2018-09-01 MED ORDER — POTASSIUM CHLORIDE 10 MEQ/50ML IV SOLN
10.0000 meq | INTRAVENOUS | Status: AC
  Administered 2018-09-01 (×2): 10 meq via INTRAVENOUS
  Filled 2018-09-01 (×2): qty 50

## 2018-09-01 NOTE — Progress Notes (Signed)
Referring Physician(s): Mannem,P  Supervising Physician: Richarda Overlie  Patient Status:  [facility] - In-pt  Chief Complaint:  cholecystitis  Subjective: Pt intubated/sedated   Allergies: Amoxicillin and Penicillins  Medications: Prior to Admission medications   Not on File     Vital Signs: BP 111/78   Pulse 80   Temp (!) 97 F (36.1 C)   Resp (!) 22   Ht 6' (1.829 m)   Wt 231 lb 11.3 oz (105.1 kg)   SpO2 99%   BMI 31.42 kg/m   Physical Exam GB drain intact, insertion site ok, output 40 cc dark bile; drain flushed without difficulty  Imaging: Dg Abd 1 View  Result Date: 2018-09-24 CLINICAL DATA:  Abdominal distention. EXAM: ABDOMEN - 1 VIEW COMPARISON:  None. FINDINGS: NG tube is in the mid stomach. Moderate gas and stool within the colon. No evidence of bowel obstruction. No organomegaly or visible free air. IMPRESSION: NG tube tip in the mid stomach. Moderate gas and stool within the colon. Electronically Signed   By: Charlett Nose M.D.   On: 09-24-18 08:48   US Abdomen Complete  Result Date: 08/29/2018 CLINICAL DATA:  42 year old male with renal failure, elevated LFTs. EXAM: ABDOMEN ULTRASOUND COMPLETE COMPARISON:  CT Abdomen and Pelvis 07/12/2014. FINDINGS: Gallbladder: Floating gallstone measuring 19 millimeters is noted on image 4. There is abundant superimposed dependent gallbladder sludge. There is moderate to severe gallbladder wall thickening up to 13 millimeters. There is trace pericholecystic fluid. However, no sonographic Murphy sign was elicited. Common bile duct: Diameter: 3 millimeters, normal. Liver: Trace perihepatic free fluid. No focal lesion identified. Within normal limits in parenchymal echogenicity. Portal vein is patent on color Doppler imaging with normal direction of blood flow towards the liver. IVC: Incompletely visualized due to overlying bowel gas, visualized portions within normal limits. Pancreas: Visualized portion unremarkable. Spleen:  Size and appearance within normal limits. Right Kidney: Length: 14.4 centimeters. Echogenicity within normal limits. No mass or hydronephrosis visualized. Small volume of right perinephric free fluid (image 88). Left Kidney: Length: 13.2 centimeters. Echogenicity within normal limits. No mass or hydronephrosis visualized. Small volume of left perinephric fluid suspected on image 105. Abdominal aorta: No aneurysm visualized. Other findings: None. IMPRESSION: 1. Abnormal gallbladder with severe wall thickening, sludge, and cholelithiasis. Despite absent sonographic Murphy sign ACUTE CHOLECYSTITIS should be considered. 2. Small volume of perihepatic and pericholecystic free fluid. No evidence of bile duct obstruction. 3. Small volume of bilateral perinephric free fluid which may relate to renal failure. No hydronephrosis and otherwise negative ultrasound appearance of both kidneys. Electronically Signed   By: Odessa Fleming M.D.   On: 08/29/2018 11:19   Ir Perc Cholecystostomy  Result Date: 08/31/2018 INDICATION: 42 year old male with a history of acute cholecystitis, sepsis, shock, multi organ failure EXAM: PERCUTANEOUS CHOLECYSTOSTOMY MEDICATIONS: Multiple ICU medications including antibiotics ANESTHESIA/SEDATION: None. FLUOROSCOPY TIME:  Fluoroscopy Time: 0 minutes 18 seconds (3 mGy). COMPLICATIONS: None PROCEDURE: Informed written consent was obtained from the patient's family after a thorough discussion of the procedural risks, benefits and alternatives. All questions were addressed. Maximal Sterile Barrier Technique was utilized including caps, mask, sterile gowns, sterile gloves, sterile drape, hand hygiene and skin antiseptic. A timeout was performed prior to the initiation of the procedure. Ultrasound survey of the right upper quadrant was performed for planning purposes. Once the patient is prepped and draped in the usual sterile fashion, the skin and subcutaneous tissues overlying the gallbladder were  generously infiltrated 1% lidocaine for local anesthesia. A  coaxial needle was advanced under ultrasound guidance through the skin subcutaneous tissues and a small segment of liver into the gallbladder lumen. With removal of the stylet, spontaneous dark bile drainage occurred. Using modified Seldinger technique, a 10 French drain was placed into the gallbladder fossa, with aspiration of the sample for the lab. Contrast injection confirmed position of the tube within the gallbladder lumen. Drainage catheter was attached to gravity drain with a suture retention placed. Patient tolerated the procedure well and remained hemodynamically stable throughout. No complications were encountered and no significant blood loss encountered. IMPRESSION: Status post image guided percutaneous cholecystostomy. Signed, Yvone Neu. Reyne Dumas, RPVI [facility] Radiology Electronically Signed   By: Gilmer Mor D.O.   On: 08/31/2018 11:22   Dg Chest Port 1 View  Result Date: 08/31/2018 CLINICAL DATA:  Acute respiratory failure. EXAM: PORTABLE CHEST 1 VIEW COMPARISON:  None. FINDINGS: Endotracheal tube, nasogastric 2, right subclavian central venous catheter and right IJ Swan-Ganz catheter are unchanged. Lungs are adequately inflated with stable left base/retrocardiac hazy opacification suggesting small left effusion with associated atelectasis. Mild stable cardiomegaly. Remainder of the exam is unchanged. IMPRESSION: Stable hazy left base opacification likely small effusion with atelectasis. Tubes and lines as described. Electronically Signed   By: Elberta Fortis M.D.   On: 08/31/2018 14:18   Dg Chest Port 1 View  Result Date: 08/30/2018 CLINICAL DATA:  Acute respiratory failure. EXAM: PORTABLE CHEST 1 VIEW COMPARISON:  One-view chest x-ray 08/29/2018 FINDINGS: Heart is enlarged. Endotracheal tube is scratched at the endotracheal tube terminates 6 cm above the carina, in  satisfactory position. The tip of a Swan-Ganz catheter is in the main pulmonary outflow tract. The side port of the NG tube is in the stomach. For bladder pads are in place. The warming/cooling blanket is in place. Mild edema is stable. Left basilar airspace disease is noted. IMPRESSION: 1. Cardiomegaly with mild edema. 2. Left basilar airspace disease likely reflects atelectasis. Infection is not excluded. 3. Support apparatus is stable and in satisfactory position. Electronically Signed   By: Marin Roberts M.D.   On: 08/30/2018 08:26   Dg Chest Port 1 View  Result Date: 08/29/2018 CLINICAL DATA:  Check endotracheal tube placement EXAM: PORTABLE CHEST 1 VIEW COMPARISON:  09/08/2018 FINDINGS: Endotracheal tube, Swan-Ganz catheter, right subclavian central line and nasogastric catheter are noted and stable in appearance. Cardiac shadow is enlarged but stable. Stable left basilar infiltrate is noted when compare with the previous day. No new focal abnormality is noted. IMPRESSION: Stable left basilar infiltrate. Tubes and lines as described. Electronically Signed   By: Alcide Clever M.D.   On: 08/29/2018 08:09   Dg Chest Port 1 View  Result Date: 09/06/2018 CLINICAL DATA:  Recent hypoxia EXAM: PORTABLE CHEST 1 VIEW COMPARISON:  10/17/9 FINDINGS: Cardiac shadow remains enlarged. Endotracheal tube and nasogastric catheter are stable. Swan-Ganz catheter and right subclavian catheter are stable as well. Lungs are well aerated bilaterally. Patchy left retrocardiac density is noted. Stable central vascular congestion is noted without significant interstitial edema. No pneumothorax is noted. IMPRESSION: Stable vascular congestion and left basilar infiltrate. No new focal abnormality is seen. Electronically Signed   By: Alcide Clever M.D.   On: 09/03/2018 11:31   Dg Chest Port 1 View  Result Date: 09/08/2018 CLINICAL DATA:  Swan-Ganz placement EXAM: PORTABLE CHEST 1 VIEW COMPARISON:  09/07/2018 FINDINGS:  Interval placement of Swan-Ganz catheter with tip in the central right pulmonary artery. No pneumothorax. Endotracheal tube,  right central line and NG tube are unchanged. Cardiomegaly with vascular congestion and bilateral lower lobe airspace opacities, left greater than right. Slight improvement in the right lung base airspace opacity. IMPRESSION: Interval placement of right Swan-Ganz catheter with the tip in the central right pulmonary artery. No pneumothorax. Cardiomegaly with vascular congestion and bilateral lower lobe airspace opacities, slightly improved on the right. Electronically Signed   By: Charlett Nose M.D.   On: 09/26/18 10:01    Labs:  CBC: Recent Labs    08/29/18 0330 08/29/18 1813 08/30/18 0310 08/31/18 0413 09/01/18 0356  WBC 23.9*  --  20.0* 17.9* 14.9*  HGB 12.5* 13.6 12.6* 12.6* 12.9*  HCT 38.1* 40.0 38.5* 38.1* 39.7  PLT 193  --  160 143* 109*    COAGS: Recent Labs    08/29/2018 1851 08/30/18 0310 08/31/18 0413 09/01/18 0356  INR 2.58 3.40 2.65 2.33  APTT 70*  --   --   --     BMP: Recent Labs    08/30/18 0310 08/31/18 0413 08/31/18 2331 09/01/18 0356  NA 133* 136 137 138  K 4.1 3.6 3.5 3.6  CL 95* 99 104 103  CO2 24 27 25 29   GLUCOSE 147* 150* 139* 138*  BUN 59* 69* 70* 71*  CALCIUM 7.5* 7.7* 7.5* 7.8*  CREATININE 3.09* 2.16* 1.91* 1.92*  GFRNONAA 24* 36* 42* 42*  GFRAA 27* 42* 49* 49*    LIVER FUNCTION TESTS: Recent Labs    08/29/18 0841 08/30/18 0310 08/30/18 1054 08/31/18 0413 09/01/18 0356  BILITOT 6.9* 7.7* 7.9* 9.7* 10.8*  AST 8,606* 3,308*  --  848* 267*  ALT 6,915* 5,164*  --  3,316* 2,214*  ALKPHOS 135* 127*  --  118 117  PROT 5.6* 5.5*  --  5.6* 5.4*  ALBUMIN 2.8* 2.6*  --  2.5* 2.4*    Assessment and Plan: Pt with hx acute calculus cholecystitis/sepsis/shock/multiorgan failure; s/p perc cholecystostomy 10/20; afebrile; WBC 14.9(17.9), hgb stable; creat 1.92, t bili 10.8(9.7); bile cx pend; cont drain irrigation tid;  drain will need to remain in place at least 4-6 weeks to allow for tract maturation before removing unless cholecystectomy done in interim   Electronically Signed: D. Jeananne Rama, PA-C 09/01/2018, 7:53 AM   I spent a total of 15 minutes at the the patient's bedside AND on the patient's hospital floor or unit, greater than 50% of which was counseling/coordinating care for percutaneous cholecystostomy    Patient ID: Cody Patterson, male   DOB: Apr 08, 1976, 42 y.o.   MRN: [id number]

## 2018-09-01 NOTE — Progress Notes (Addendum)
Patient ID: Cody Patterson, male   DOB: 1976/03/12, 42 y.o.   MRN: [id number]     Advanced Heart Failure Rounding Note  PCP-Cardiologist: No primary care provider on file.   Subjective:    Remains on milrinone 0.375, norepinephrine 7, and amio 60. Now off vaso and epi. He was in a fib RVR up to 150s yesterday with CI 1.5-1.8. Converted into NSR this am around 5 am, CI now up to 2.8.    Afebrile. WBC 20 > 17.9 > 14.9. +H influenzae in sputum   Abdominal US strongly suggestive of acute cholecystitis, not surgical candidate. Underwent percutaneous cholecystostomy 10/20. Bili 10.9  INR 2.33  Creatinine 3.09 => 2.16 => 1.92, good UOP.  AST/ALT continues to trend down, but bilirubin trending up.   Remains intubated and sedated. FiO2 30%  CXR today with resolved left base infiltrate. Weight down 5 lbs with IV lasix. CVP 9 Co-ox 72%   Swan numbers CVP 9 PAP 37/28 CI 2.8 Co-ox 72% SVR: 1700  Objective:   Weight Range: 105.1 kg Body mass index is 31.42 kg/m.   Vital Signs:   Temp:  [96.6 F (35.9 C)-99.5 F (37.5 C)] 97 F (36.1 C) (10/21 0700) Pulse Rate:  [32-141] 80 (10/21 0700) Resp:  [11-24] 14 (10/21 0700) BP: (89-132)/(58-101) 101/71 (10/21 0700) SpO2:  [86 %-99 %] 98 % (10/21 0700) Arterial Line BP: (89-130)/(64-94) 109/78 (10/21 0700) FiO2 (%):  [30 %-100 %] 30 % (10/21 0400) Weight:  [105.1 kg] 105.1 kg (10/21 0440) Last BM Date: 08/29/2018  Weight change: Filed Weights   08/30/18 0500 08/31/18 0500 09/01/18 0440  Weight: 104 kg 107.3 kg 105.1 kg    Intake/Output:   Intake/Output Summary (Last 24 hours) at 09/01/2018 0705 Last data filed at 09/01/2018 0700 Gross per 24 hour  Intake 3230.98 ml  Output 2665 ml  Net 565.98 ml      Physical Exam   General: Intubated/sedated.  HEENT: + ETT Neck: Supple. JVP ~8. RIJ swan Carotids 2+ bilat; no bruits. No thyromegaly or nodule noted. Cor: PMI nondisplaced. RRR, No M/G/R noted Lungs: Diminished basilar  sounds Abdomen: Soft, non-tender, non-distended, no HSM. No bruits or masses. +BS RUQ choley drain. Extremities: No cyanosis, clubbing, or rash. 1+ BLE edema Neuro: Intubated/sedated  Telemetry   Afib 100s > converted to NSR 80s around 5 am, personally reviewed.   EKG    No new tracings.  Labs    CBC Recent Labs    08/31/18 0413 09/01/18 0356  WBC 17.9* 14.9*  HGB 12.6* 12.9*  HCT 38.1* 39.7  MCV 83.2 85.0  PLT 143* 937*   Basic Metabolic Panel Recent Labs    08/31/18 0413 08/31/18 2331 09/01/18 0356  NA 136 137 138  K 3.6 3.5 3.6  CL 99 104 103  CO2 _0 GLUCOSE 150* 139* 138*  BUN 69* 70* 71*  CREATININE 2.16* 1.91* 1.92*  CALCIUM 7.7* 7.5* 7.8*  MG 2.0  --  2.1  PHOS 3.1  --  4.3   Liver Function Tests Recent Labs    08/31/18 0413 09/01/18 0356  AST 848* 267*  ALT 3,316* 2,214*  ALKPHOS 118 117  BILITOT 9.7* 10.8*  PROT 5.6* 5.4*  ALBUMIN 2.5* 2.4*   No results for input(s): LIPASE, AMYLASE in the last 72 hours. Cardiac Enzymes Recent Labs    08/29/18 0841 08/30/18 0310  TROPONINI 1.12* 0.65*    BNP: BNP (last 3 results) No results for input(s): BNP in the  last 8760 hours.  ProBNP (last 3 results) No results for input(s): PROBNP in the last 8760 hours.   D-Dimer No results for input(s): DDIMER in the last 72 hours. Hemoglobin A1C No results for input(s): HGBA1C in the last 72 hours. Fasting Lipid Panel No results for input(s): CHOL, HDL, LDLCALC, TRIG, CHOLHDL, LDLDIRECT in the last 72 hours. Thyroid Function Tests No results for input(s): TSH, T4TOTAL, T3FREE, THYROIDAB in the last 72 hours.  Invalid input(s): FREET3  Other results:   Imaging    Ir Perc Cholecystostomy  Result Date: 08/31/2018 INDICATION: 43 year old male with a history of acute cholecystitis, sepsis, shock, multi organ failure EXAM: PERCUTANEOUS CHOLECYSTOSTOMY MEDICATIONS: Multiple ICU medications including antibiotics ANESTHESIA/SEDATION: None.  FLUOROSCOPY TIME:  Fluoroscopy Time: 0 minutes 18 seconds (3 mGy). COMPLICATIONS: None PROCEDURE: Informed written consent was obtained from the patient's family after a thorough discussion of the procedural risks, benefits and alternatives. All questions were addressed. Maximal Sterile Barrier Technique was utilized including caps, mask, sterile gowns, sterile gloves, sterile drape, hand hygiene and skin antiseptic. A timeout was performed prior to the initiation of the procedure. Ultrasound survey of the right upper quadrant was performed for planning purposes. Once the patient is prepped and draped in the usual sterile fashion, the skin and subcutaneous tissues overlying the gallbladder were generously infiltrated 1% lidocaine for local anesthesia. A coaxial needle was advanced under ultrasound guidance through the skin subcutaneous tissues and a small segment of liver into the gallbladder lumen. With removal of the stylet, spontaneous dark bile drainage occurred. Using modified Seldinger technique, a 10 French drain was placed into the gallbladder fossa, with aspiration of the sample for the lab. Contrast injection confirmed position of the tube within the gallbladder lumen. Drainage catheter was attached to gravity drain with a suture retention placed. Patient tolerated the procedure well and remained hemodynamically stable throughout. No complications were encountered and no significant blood loss encountered. IMPRESSION: Status post image guided percutaneous cholecystostomy. Signed, Dulcy Fanny. Dellia Nims, RPVI [facility] Electronically Signed   By: Corrie Mckusick D.O.   On: 08/31/2018 11:22   Dg Chest Port 1 View  Result Date: 08/31/2018 CLINICAL DATA:  Acute respiratory failure. EXAM: PORTABLE CHEST 1 VIEW COMPARISON:  None. FINDINGS: Endotracheal tube, nasogastric 2, right subclavian central venous catheter and right IJ Swan-Ganz catheter are  unchanged. Lungs are adequately inflated with stable left base/retrocardiac hazy opacification suggesting small left effusion with associated atelectasis. Mild stable cardiomegaly. Remainder of the exam is unchanged. IMPRESSION: Stable hazy left base opacification likely small effusion with atelectasis. Tubes and lines as described. Electronically Signed   By: Marin Olp M.D.   On: 08/31/2018 14:18     Medications:     Scheduled Medications: . chlorhexidine gluconate (MEDLINE KIT)  15 mL Mouth Rinse BID  . Chlorhexidine Gluconate Cloth  6 each Topical Daily  . enoxaparin (LOVENOX) injection  30 mg Subcutaneous Q24H  . feeding supplement (VITAL HIGH PROTEIN)  1,000 mL Per Tube Q24H  . furosemide  60 mg Intravenous BID  . hydrocortisone sod succinate (SOLU-CORTEF) inj  50 mg Intravenous Q6H  . insulin aspart  0-9 Units Subcutaneous Q4H  . mouth rinse  15 mL Mouth Rinse 10 times per day  . mupirocin ointment  1 application Nasal BID  . sodium chloride flush  10-40 mL Intracatheter Q12H  . sodium chloride flush  5 mL Intracatheter Q8H  . vancomycin variable dose per unstable renal function (pharmacist dosing)  Does not apply See admin instructions    Infusions: . sodium chloride Stopped (08/26/2018 1950)  . sodium chloride 10 mL/hr at 09/01/18 0700  . sodium chloride Stopped (09/01/18 9326)  . amiodarone 60 mg/hr (09/01/18 0700)  . epinephrine Stopped (08/29/18 1003)  . famotidine (PEPCID) IV 20 mg (08/31/18 1146)  . fentaNYL infusion INTRAVENOUS 400 mcg/hr (09/01/18 0700)  . meropenem (MERREM) IV Stopped (09/01/18 0600)  . midazolam (VERSED) infusion 5 mg/hr (09/01/18 0700)  . milrinone 0.375 mcg/kg/min (09/01/18 0700)  . norepinephrine (LEVOPHED) Adult infusion 7 mcg/min (09/01/18 0700)  . potassium chloride 50 mL/hr at 09/01/18 0625  . vancomycin 166.7 mL/hr at 09/01/18 0700    PRN Medications: midazolam, sodium chloride flush    Patient Profile   Cody Patterson is a 42  y.o. 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   Presented to [facility] 10/16 with worsening dyspnea. Became hypotensive and with respiratory distress requiring pressor support and intubation. Transferred to [facility] for treatment and further evaluation.   Assessment/Plan   1. Shock: Mixed septic and cardiogenic shock.  CI 2.8 today with good co-ox at 72%.  MAPs 70-80s stable on current support, now off epinephrine and vaso and weaning down norepinephrine.  - H.flu in sputum. - Slow wean of norepionephrine as tolerated.  - Suspect primarily septic shock.  2. Acute on chronic systolic CHF:  Echocardiogram from [facility] 10/16: LV moderately dilated, severely impaired EF at 10%, moderate RV dysfunction, moderately elevated RVSP.  Nonischemic cardiomyopathy pre-existed this hospitalization (had cath at [facility]).  Suspect cardiomyopathy is due to substance abuse (amphetamines).   - As above, titrating down norepinephrine.  - Continue milrinone to 0.375. May be able to decrease again today. Coox 72% and CI 2.8. CVP - Continue Lasix 60 mg IV bid today.  - He is not candidate for VAD or transplant with active substance abuse.  3. Acute respiratory failure: Remains intubated and sedated. Suspect PNA. No change. P/CCM managing. 4. Polysubstance abuse: UDS + amphetamines and THC. No change.  5. ID: Septic shock.  Has H influenzae in sputum, suspect PNA.  He also appears to have acute cholecystitis on abdominal US.  - Continue meropenem and vancomycin. WBC trending down. Afebrile. SVR now elevated.  - S/p percutaneous cholecystostomy tube 10/20 (per CCM).  6. Transaminitis: Elevated LFTs.  Suspect ischemic hepatitis with shock + acute cholecystitis. Transaminases trending down but tbili rising, likely in setting of acute cholecystitis.  7. Ileus: KUB 08/15/2018 with large illeus in the setting of stool coming through OG tube.  - Improved.  8. Atrial fibrillation,  paroxysmal: Patient went into atrial fibrillation with RVR Saturday night and amio drip was increased. Now in NSR. Converted around 5 am today.  - INR elevated with liver failure, will not add heparin gtt. INR 2.33 (received vitamin K 10/19 and 10/20 for choley drain)  - Continue amiodarone gtt 60 mg/hr.  9. Acute cholecystitis: - s/p perc drain  Length of Stay: [place], NP  09/01/2018, 7:05 AM  Advanced Heart Failure Team Pager [phone] (M-F; 7a - 4p)  Please contact [facility] for night-coverage after hours (4p -7a ) and weekends on [email or web address]  Agree with above.   He remains intubated/sedated. S/p perc C-tube over the weekend. Billi still 11. Was in AF over the weekend. Now back in NSR on IV amio. Remains on NE and milrinone. Co-ox 72%. Hemodynamics improving.  Weight up 29 pounds from  admit  Intubated/sedated RIJ swan.CVP 10 Cor RRR Lungs coarse Ab soft NT. C-tube with mild drainage Ext. Well perfused trace 1+ edema   Remains critically ill with initial septic shock on top of severe chronic HF. Now s/p perc C-tube for cholescystitis. Remains markedly volume overloaded. Continue inotropic support. Can wean vaso and NE as tolerated. Can pull swan today. Hopefully extubate soon.  CRITICAL CARE Performed by: Glori Bickers  Total critical care time: 45 minutes  Critical care time was exclusive of separately billable procedures and treating other patients.  Critical care was necessary to treat or prevent imminent or life-threatening deterioration.  Critical care was time spent personally by me (independent of midlevel providers or residents) on the following activities: development of treatment plan with patient and/or surrogate as well as nursing, discussions with consultants, evaluation of patient's response to treatment, examination of patient, obtaining history from patient or surrogate, ordering and performing treatments and interventions, ordering and review  of laboratory studies, ordering and review of radiographic studies, pulse oximetry and re-evaluation of patient's condition.   Glori Bickers, MD  8:30 AM

## 2018-09-01 NOTE — Progress Notes (Addendum)
Patient ID: Cody Patterson, male   DOB: 12-Sep-1976, 42 y.o.   MRN: [id number]       Subjective: Pt sedated on vent.  Got perc chole drain last night.  Objective: Vital signs in last 24 hours: Temp:  [96.6 F (35.9 C)-99.5 F (37.5 C)] 97 F (36.1 C) (10/21 0700) Pulse Rate:  [32-141] 80 (10/21 0700) Resp:  [11-24] 14 (10/21 0700) BP: (89-132)/(58-101) 101/71 (10/21 0700) SpO2:  [86 %-99 %] 98 % (10/21 0700) Arterial Line BP: (89-130)/(64-94) 109/78 (10/21 0700) FiO2 (%):  [30 %-100 %] 30 % (10/21 0400) Weight:  [105.1 kg] 105.1 kg (10/21 0440) Last BM Date: 09/11/2018  Intake/Output from previous day: 10/20 0701 - 10/21 0700 In: 3231 [I.V.:2731.8; NG/GT:30; IV Piggyback:464.2] Out: 2665 [Urine:2625; Drains:40] Intake/Output this shift: No intake/output data recorded.  PE: Gen: critically ill male on vent, sedated Heart: regular Lungs: CTAB Abd: soft, unable to appreciate tenderness due to sedation, hypoactive BS, ND, perc chole drain in place with thick sludgey bilious output.  Lab Results:  Recent Labs    08/31/18 0413 09/01/18 0356  WBC 17.9* 14.9*  HGB 12.6* 12.9*  HCT 38.1* 39.7  PLT 143* 109*   BMET Recent Labs    08/31/18 2331 09/01/18 0356  NA 137 138  K 3.5 3.6  CL 104 103  CO2 25 29  GLUCOSE 139* 138*  BUN 70* 71*  CREATININE 1.91* 1.92*  CALCIUM 7.5* 7.8*   PT/INR Recent Labs    08/31/18 0413 09/01/18 0356  LABPROT 27.9* 25.3*  INR 2.65 2.33   CMP     Component Value Date/Time   NA 138 09/01/2018 0356   K 3.6 09/01/2018 0356   CL 103 09/01/2018 0356   CO2 29 09/01/2018 0356   GLUCOSE 138 (H) 09/01/2018 0356   BUN 71 (H) 09/01/2018 0356   CREATININE 1.92 (H) 09/01/2018 0356   CALCIUM 7.8 (L) 09/01/2018 0356   PROT 5.4 (L) 09/01/2018 0356   ALBUMIN 2.4 (L) 09/01/2018 0356   AST 267 (H) 09/01/2018 0356   ALT 2,214 (H) 09/01/2018 0356   ALKPHOS 117 09/01/2018 0356   BILITOT 10.8 (H) 09/01/2018 0356   GFRNONAA 42 (L) 09/01/2018 0356    GFRAA 49 (L) 09/01/2018 0356   Lipase  No results found for: LIPASE     Studies/Results: Ir Perc Cholecystostomy  Result Date: 08/31/2018 INDICATION: 42 year old male with a history of acute cholecystitis, sepsis, shock, multi organ failure EXAM: PERCUTANEOUS CHOLECYSTOSTOMY MEDICATIONS: Multiple ICU medications including antibiotics ANESTHESIA/SEDATION: None. FLUOROSCOPY TIME:  Fluoroscopy Time: 0 minutes 18 seconds (3 mGy). COMPLICATIONS: None PROCEDURE: Informed written consent was obtained from the patient's family after a thorough discussion of the procedural risks, benefits and alternatives. All questions were addressed. Maximal Sterile Barrier Technique was utilized including caps, mask, sterile gowns, sterile gloves, sterile drape, hand hygiene and skin antiseptic. A timeout was performed prior to the initiation of the procedure. Ultrasound survey of the right upper quadrant was performed for planning purposes. Once the patient is prepped and draped in the usual sterile fashion, the skin and subcutaneous tissues overlying the gallbladder were generously infiltrated 1% lidocaine for local anesthesia. A coaxial needle was advanced under ultrasound guidance through the skin subcutaneous tissues and a small segment of liver into the gallbladder lumen. With removal of the stylet, spontaneous dark bile drainage occurred. Using modified Seldinger technique, a 10 French drain was placed into the gallbladder fossa, with aspiration of the sample for the lab. Contrast injection confirmed position of  the tube within the gallbladder lumen. Drainage catheter was attached to gravity drain with a suture retention placed. Patient tolerated the procedure well and remained hemodynamically stable throughout. No complications were encountered and no significant blood loss encountered. IMPRESSION: Status post image guided percutaneous cholecystostomy. Signed, Yvone Neu. Reyne Dumas, RPVI [facility]ascular and Interventional  Radiology Specialists [facility] Electronically Signed   By: Gilmer Mor D.O.   On: 08/31/2018 11:22   Dg Chest Port 1 View  Result Date: 08/31/2018 CLINICAL DATA:  Acute respiratory failure. EXAM: PORTABLE CHEST 1 VIEW COMPARISON:  None. FINDINGS: Endotracheal tube, nasogastric 2, right subclavian central venous catheter and right IJ Swan-Ganz catheter are unchanged. Lungs are adequately inflated with stable left base/retrocardiac hazy opacification suggesting small left effusion with associated atelectasis. Mild stable cardiomegaly. Remainder of the exam is unchanged. IMPRESSION: Stable hazy left base opacification likely small effusion with atelectasis. Tubes and lines as described. Electronically Signed   By: Elberta Fortis M.D.   On: 08/31/2018 14:18    Anti-infectives: Anti-infectives (From admission, onward)   Start     Dose/Rate Route Frequency Ordered Stop   08/31/18 2200  meropenem (MERREM) 2 g in sodium chloride 0.9 % 100 mL IVPB     2 g 200 mL/hr over 30 Minutes Intravenous Every 8 hours 08/31/18 1239     08/30/18 0730  vancomycin (VANCOCIN) 1,250 mg in sodium chloride 0.9 % 250 mL IVPB  Status:  Discontinued     1,250 mg 166.7 mL/hr over 90 Minutes Intravenous Every 24 hours 08/30/18 0728 08/30/18 0803   08/30/18 0600  vancomycin (VANCOCIN) 1,250 mg in sodium chloride 0.9 % 250 mL IVPB     1,250 mg 166.7 mL/hr over 90 Minutes Intravenous Every 24 hours 08/30/18 0500     08/26/2018 1800  vancomycin (VANCOCIN) IVPB 750 mg/150 ml premix  Status:  Discontinued     750 mg 150 mL/hr over 60 Minutes Intravenous Every 12 hours 08/16/2018 0446 08/13/2018 1445   08/25/2018 1445  vancomycin variable dose per unstable renal function (pharmacist dosing)      Does not apply See admin instructions 08/14/2018 1445     09/11/2018 1000  meropenem (MERREM) 2 g in sodium chloride 0.9 % 100 mL IVPB  Status:  Discontinued     2 g 200 mL/hr over 30 Minutes Intravenous Every 12 hours 08/29/2018 0831  08/31/18 1239   09/08/2018 0700  aztreonam (AZACTAM) 1 g in sodium chloride 0.9 % 100 mL IVPB  Status:  Discontinued     1 g 200 mL/hr over 30 Minutes Intravenous Every 8 hours 09/11/2018 0655 08/12/2018 0831   08/13/2018 0530  vancomycin (VANCOCIN) 1,500 mg in sodium chloride 0.9 % 500 mL IVPB     1,500 mg 250 mL/hr over 120 Minutes Intravenous  Once 08/16/2018 0446 08/29/18 0716   08/15/2018 0515  aztreonam (AZACTAM) 1 g in sodium chloride 0.9 % 100 mL IVPB  Status:  Discontinued     1 g 200 mL/hr over 30 Minutes Intravenous Every 8 hours 08/17/2018 0446 08/16/2018 0655       Assessment/Plan Cardiogenic shock Systolic heart failure with EF of 10% Acute renal failure  Acute liver failure Polysubstance abuse Noncompliance  Sepsis with shock  Probable cholecystitis/cholelithiasis -s/p perc chole drain on 10-20 by IR -cont abx therapy -leave drain for at least 6-8 weeks.  Will have to determine if patient is a surgical candidate as weeks progress.  If his heart function were to not improve and his overall  condition remain poor, he may not likely be a surgical candidate.  Time will tell. -WBC improved after drain.  Down to 14K. -TB up to 10.8, AST/ALT slightly down.   Given elevation in TB with no evidence of ductal obstruction, suspect continued rise is secondary to cholestasis along with some component of shock liver given how high his AST/ALT are. -cont to follow  FEN - IVF/TFs VTE - SCDs/ auto-anticoagulated INR 2.33 this am after several doses of Vit K yesterday ID - Merrem/Vanc   LOS: 5 days    Letha Cape , [facility] 09/01/2018, 7:26 AM Pager: [phone]

## 2018-09-01 NOTE — Progress Notes (Signed)
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 Presented to [facility] with increasing dyspnea. Acute decompensation with hypotension and severe respiratory distress requiring intubation. Transferred to [facility] for management of sepsis, advanced heart failure.  Growing H influenza in sputum.  History noted for admission to 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  Past Medical History  Known cardiomyopathy and drug abuse (THC and methamphetamines)  Significant Hospital Events   10/16- Intubated at [facility] 10/17- Worsening shock, maxed on pressors, right heart cath indicative of distributive shock, high output failure 10/18-Started on milrinone, lasix for reduced cardiac output. PA numbers show worsening CO and high wedge 10/19- Started making urine, pressors weaning down, continues on milrinone. Surgery consulted for cholecystitis 10/20- Mucus plug overnight requiring bag, lavage. Went into afib  Consults: date of consult/date signed off & final recs:  10/17- Cardiology  10/19- Surgery   Procedures (surgical and bedside):  10/16 [facility] R subclavian CVC  10/16 [facility] L femoral arterial line  10/17 PA catheter  Significant Diagnostic Tests:  Echocardiogram 10/17- Mild to moderate LV dilation with EF 20%, diffuse hypokinesis.   Restrictive diastolic function. Mildly dilated RV with mildly   decreased systolic function. No significant valvular   abnormalities  Abdominal ultrasound 08/29/2018- severe gallbladder thickening, sludge, cholelithiasis, small volume of  perihepatic, pericholecystic and perinephric free fluid.Marland Kitchen  UDS [facility] 10/16- Amphetamines, THC  Swan ganz 10/18 Initial numbers RA 10 PA 46/33 (41) PCWP 10 Thermo CO/CI  15.2/7.0 Fick 14.1/6.5 (co-ox 86%) SVR 231  Micro Data:  Bcx 10/17 >> ng Trach aspirate 10/17- H influenza  Antimicrobials:  Vancomycin 10/17 > Meropenen 10/17 > Aztreonam 10/17  Subjective:   Remains critically ill, intubated Pressors being tapered down. Versed being tapered down, remains sedated on fentanyl Good urine output with Lasix  Objective   Blood pressure 104/73, pulse 79, temperature (!) 97.2 F (36.2 C), temperature source Core, resp. rate 17, height 6' (1.829 m), weight 105.1 kg, SpO2 98 %. PAP: (31-53)/(22-44) 37/27 CVP:  [0 mmHg-21 mmHg] 7 mmHg CO:  [3 L/min-6.1 L/min] 5 L/min CI:  [1.4 L/min/m2-2.8 L/min/m2] 2.8 L/min/m2  Vent Mode: PRVC FiO2 (%):  [30 %-100 %] 30 % Set Rate:  [14 bmp] 14 bmp Vt Set:  [540 mL] 540 mL PEEP:  [5 cmH20] 5 cmH20 Plateau Pressure:  [13 cmH20-18 cmH20] 15 cmH20   Intake/Output Summary (Last 24 hours) at 09/01/2018 1022 Last data filed at 09/01/2018 1000 Gross per 24 hour  Intake 3297.11 ml  Output 3060 ml  Net 237.11 ml   Filed Weights   08/30/18 0500 08/31/18 0500 09/01/18 0440  Weight: 104 kg 107.3 kg 105.1 kg   Examination: Acutely ill, sedated, unresponsive Pupils 3 mm bilaterally equally reactive to light Icterus +, no pallor S1-S2 regular, no murmur Decreased breath sounds bilateral, minimal secretions Soft nontender abdomen 1+ bipedal edema  Resolved Hospital Problem list     Assessment & Plan:  Severe shock secondary to H. influenzae pneumonia in setting of decompensated nonischemic cardiomyopathy  Shock- Cardiogenic, septic combination Severe cardiomyopathy, noncompliant V tach, A. fib Continue weaning Levophed Milrinone being titrated by cardiology based on  echo oximetry Amiodarone drip  Acute respiratory failure  secondary to pulmonary edema Start spontaneous breathing trials  Acute metabolic encephalopathy-related to sedation -Minimize Versed, try to use fentanyl alone if possible -If needed we will add Precedex drip tolerates hemodynamically Goal RA SS -1  Severe sepsis-H. influenzae pneumonia,?  Acute cholecystitis Change to single agent meropenem,  -Taper stress dose steroids -Follow bile culture   Renal failure > improving Follow urine output and creatinine  continue Lasix 60 every 12 for negative balance   Elevated LFTs, INR secondary to shock liver Doubt acute cholecystitis status post perc drain Coagulopathy, elevated INR s/p vit K x 2 Monitor LFTs Surgery following Resume trickle tube feeds   Disposition / Summary of Today's Plan 09/01/18   Overall improving with lower pressors, while on milrinone. Would like to wean off sedation and start spontaneous breathing.  favor shock liver over infection but need to treat Haemophilus pneumonia  Best Practice    Code Status: full Family Communication: Significant other updated at bedside.  He is married but estranged from his wife.  Labs personally reviewed.  CBC: reactive leukocytosis. Recent Labs  Lab September 25, 2018 0440  08/29/18 0330 08/29/18 1813 08/30/18 0310 08/31/18 0413 09/01/18 0356  WBC 22.9*  --  23.9*  --  20.0* 17.9* 14.9*  HGB 14.0   < > 12.5* 13.6 12.6* 12.6* 12.9*  HCT 43.1   < > 38.1* 40.0 38.5* 38.1* 39.7  MCV 86.9  --  85.6  --  84.1 83.2 85.0  PLT 215  --  193  --  160 143* 109*   < > = values in this interval not displayed.    Basic Metabolic Panel: pending Recent Labs  Lab 25-Sep-2018 0440  08/29/18 0330 08/29/18 1813 08/30/18 0310 08/31/18 0413 08/31/18 2331 09/01/18 0356  NA 135   < > 131* 133* 133* 136 137 138  K 5.1   < > 4.3 4.2 4.1 3.6 3.5 3.6  CL 98   < > 97* 93* 95* 99 104 103  CO2 21*   < > 20*  --  24 27 25 29   GLUCOSE 55*   < > 201* 147* 147* 150* 139* 138*  BUN 28*   < > 41* 51*  59* 69* 70* 71*  CREATININE 2.69*   < > 3.67* 3.60* 3.09* 2.16* 1.91* 1.92*  CALCIUM 7.8*   < > 6.7*  --  7.5* 7.7* 7.5* 7.8*  MG 1.8  --  1.8  --  2.2 2.0  --  2.1  PHOS 6.9*  --  5.6*  --  4.6 3.1  --  4.3   < > = values in this interval not displayed.   GFR: Estimated Creatinine Clearance: 64.1 mL/min (A) (by C-G formula based on SCr of 1.92 mg/dL (H)). Recent Labs  Lab 2018/09/25 0830  2018/09/25 1849 08/29/18 0330 08/29/18 0744 08/30/18 0310 08/31/18 0413 09/01/18 0356  PROCALCITON 27.23  --   --  22.85  --  14.68  --   --   WBC  --   --   --  23.9*  --  20.0* 17.9* 14.9*  LATICACIDVEN  --    < > 8.7*  --  3.4* 3.1*  --  1.5   < > = values in this interval not displayed.    Liver Function Tests: Recent Labs  Lab 25-Sep-2018 1718 08/29/18 0841 08/30/18 0310 08/30/18 1054 08/31/18 0413 09/01/18 0356  AST 11,847* 8,606* 3,308*  --  848* 267*  ALT 6,537* 6,915* 5,164*  --  3,316* 2,214*  ALKPHOS 112 135* 127*  --  118 117  BILITOT 5.7* 6.9* 7.7* 7.9* 9.7* 10.8*  PROT 5.4* 5.6* 5.5*  --  5.6* 5.4*  ALBUMIN 2.8* 2.8* 2.6*  --  2.5* 2.4*   No results for input(s): LIPASE, AMYLASE in the last 168 hours. No results for input(s): AMMONIA in the last 168 hours.  ABG    Component Value Date/Time   PHART 7.440 09/01/2018 0408   PCO2ART 45.9 09/01/2018 0408   PO2ART 130.0 (H) 09/01/2018 0408   HCO3 31.3 (H) 09/01/2018 0408   TCO2 33 (H) 09/01/2018 0408   ACIDBASEDEF 0.8 08/29/2018 0340   O2SAT 72.1 09/01/2018 0420     Coagulation Profile: Recent Labs  Lab 08/23/2018 1851 08/30/18 0310 08/31/18 0413 09/01/18 0356  INR 2.58 3.40 2.65 2.33    Cardiac Enzymes: Recent Labs  Lab 08/29/18 0841 08/30/18 0310  TROPONINI 1.12* 0.65*    HbA1C: No results found for: HGBA1C  CBG: Recent Labs  Lab 08/31/18 1221 08/31/18 1616 08/31/18 2011 08/31/18 2337 09/01/18 0407  GLUCAP 111* 134* 128* 134* 127*    The patient is critically ill with multiple organ systems  failure and requires high complexity decision making for assessment and support, frequent evaluation and titration of therapies, application of advanced monitoring technologies and extensive interpretation of multiple databases. Critical Care Time devoted to patient care services described in this note independent of APP/resident  time is 35 minutes.   Cyril Mourning MD. Tonny Bollman. [place] [facility] Pager [phone] If no response call [phone]    09/01/2018, 10:22 AM

## 2018-09-01 NOTE — Progress Notes (Signed)
   09/01/18 1300  Clinical Encounter Type  Visited With Patient  Visit Type Spiritual support  Referral From Nurse  Spiritual Encounters  Spiritual Needs Prayer  Responded to page from nurse for a prayer request from pt. Wife. Per nurse patient is heavily sedated. I followed safety precaution and went in and prayed with patient. Follow as requested. Henrietta Hoover [phone]

## 2018-09-01 NOTE — Progress Notes (Signed)
Called CCM Dr. Regarding current potassium level.

## 2018-09-01 NOTE — Progress Notes (Signed)
Pt heart rate/rhythm still elevated and alternating between afib --> ST -->afib/aflutter. Request to check potassium level due to receiving additional diuresis today

## 2018-09-01 NOTE — Progress Notes (Signed)
[facility] ADULT ICU REPLACEMENT PROTOCOL FOR AM LAB REPLACEMENT ONLY  The patient does apply for the [facility] Adult ICU Electrolyte Replacment Protocol based on the criteria listed below:   1. Is GFR >/= 40 ml/min? Yes.    Patient's GFR today is 42 2. Is urine output >/= 0.5 ml/kg/hr for the last 6 hours? Yes.   Patient's UOP is 0.5 ml/kg/hr 3. Is BUN < 60 mg/dL? Yes.    Patient's BUN today is 60 4. Abnormal electrolyte(s):k 3.6 5. Ordered repletion with: protocol 6. If a panic level lab has been reported, has the CCM MD in charge been notified? No..   Physician:    Markus Daft A 09/01/2018 5:54 AM

## 2018-09-01 NOTE — Progress Notes (Signed)
Updated CCM and plan to titrate down on sedation. Versed first followed by fentanyl. Will monitor throughout

## 2018-09-01 NOTE — Progress Notes (Signed)
Updated CCM with patient status. Plan to titrate Vasopressin down and then off as able.  2200-SVR coming down and CI going up.

## 2018-09-01 NOTE — Progress Notes (Signed)
Updated Dr. Franz Dell on patient's heart rate and decreasing CI. Plan to try to come down on vasopressors as able to decrease SVR to see if that would help.

## 2018-09-02 ENCOUNTER — Inpatient Hospital Stay (HOSPITAL_COMMUNITY): Payer: Medicaid Other

## 2018-09-02 DIAGNOSIS — J9601 Acute respiratory failure with hypoxia: Secondary | ICD-10-CM

## 2018-09-02 DIAGNOSIS — G9340 Encephalopathy, unspecified: Secondary | ICD-10-CM

## 2018-09-02 LAB — COOXEMETRY PANEL
CARBOXYHEMOGLOBIN: 1.2 % (ref 0.5–1.5)
Carboxyhemoglobin: 1.4 % (ref 0.5–1.5)
METHEMOGLOBIN: 1.3 % (ref 0.0–1.5)
METHEMOGLOBIN: 1.4 % (ref 0.0–1.5)
O2 SAT: 99.6 %
O2 Saturation: 67.3 %
Total hemoglobin: 14 g/dL (ref 12.0–16.0)
Total hemoglobin: 14 g/dL (ref 12.0–16.0)

## 2018-09-02 LAB — PROTIME-INR
INR: 2.1
PROTHROMBIN TIME: 23.3 s — AB (ref 11.4–15.2)

## 2018-09-02 LAB — CBC
HCT: 41.3 % (ref 39.0–52.0)
Hemoglobin: 13.8 g/dL (ref 13.0–17.0)
MCH: 27.9 pg (ref 26.0–34.0)
MCHC: 33.4 g/dL (ref 30.0–36.0)
MCV: 83.6 fL (ref 80.0–100.0)
NRBC: 0.2 % (ref 0.0–0.2)
PLATELETS: 108 10*3/uL — AB (ref 150–400)
RBC: 4.94 MIL/uL (ref 4.22–5.81)
RDW: 18.6 % — ABNORMAL HIGH (ref 11.5–15.5)
WBC: 16.6 10*3/uL — AB (ref 4.0–10.5)

## 2018-09-02 LAB — CULTURE, BLOOD (ROUTINE X 2)
CULTURE: NO GROWTH
Culture: NO GROWTH
SPECIAL REQUESTS: ADEQUATE
SPECIAL REQUESTS: ADEQUATE

## 2018-09-02 LAB — COMPREHENSIVE METABOLIC PANEL
ALT: 1403 U/L — ABNORMAL HIGH (ref 0–44)
AST: 125 U/L — ABNORMAL HIGH (ref 15–41)
Albumin: 2.2 g/dL — ABNORMAL LOW (ref 3.5–5.0)
Alkaline Phosphatase: 108 U/L (ref 38–126)
Anion gap: 8 (ref 5–15)
BILIRUBIN TOTAL: 9.2 mg/dL — AB (ref 0.3–1.2)
BUN: 67 mg/dL — AB (ref 6–20)
CO2: 28 mmol/L (ref 22–32)
Calcium: 8.1 mg/dL — ABNORMAL LOW (ref 8.9–10.3)
Chloride: 106 mmol/L (ref 98–111)
Creatinine, Ser: 1.7 mg/dL — ABNORMAL HIGH (ref 0.61–1.24)
GFR, EST AFRICAN AMERICAN: 56 mL/min — AB (ref >60)
GFR, EST NON AFRICAN AMERICAN: 49 mL/min — AB (ref >60)
Glucose, Bld: 149 mg/dL — ABNORMAL HIGH (ref 70–99)
POTASSIUM: 4.3 mmol/L (ref 3.5–5.1)
Sodium: 142 mmol/L (ref 135–145)
TOTAL PROTEIN: 5.4 g/dL — AB (ref 6.5–8.1)

## 2018-09-02 LAB — GLUCOSE, CAPILLARY
GLUCOSE-CAPILLARY: 126 mg/dL — AB (ref 70–99)
GLUCOSE-CAPILLARY: 106 mg/dL — AB (ref 70–99)
Glucose-Capillary: 104 mg/dL — ABNORMAL HIGH (ref 70–99)
Glucose-Capillary: 111 mg/dL — ABNORMAL HIGH (ref 70–99)
Glucose-Capillary: 115 mg/dL — ABNORMAL HIGH (ref 70–99)
Glucose-Capillary: 122 mg/dL — ABNORMAL HIGH (ref 70–99)
Glucose-Capillary: 129 mg/dL — ABNORMAL HIGH (ref 70–99)

## 2018-09-02 LAB — MAGNESIUM: Magnesium: 2 mg/dL (ref 1.7–2.4)

## 2018-09-02 LAB — PHOSPHORUS: PHOSPHORUS: 4.1 mg/dL (ref 2.5–4.6)

## 2018-09-02 MED ORDER — POTASSIUM CHLORIDE 20 MEQ/15ML (10%) PO SOLN
40.0000 meq | Freq: Once | ORAL | Status: AC
  Administered 2018-09-02: 40 meq
  Filled 2018-09-02: qty 30

## 2018-09-02 MED ORDER — HYDROCORTISONE NA SUCCINATE PF 100 MG IJ SOLR
50.0000 mg | Freq: Two times a day (BID) | INTRAMUSCULAR | Status: DC
  Administered 2018-09-02: 50 mg via INTRAVENOUS
  Filled 2018-09-02: qty 2

## 2018-09-02 MED ORDER — FUROSEMIDE 10 MG/ML IJ SOLN
8.0000 mg/h | INTRAVENOUS | Status: DC
  Administered 2018-09-02: 8 mg/h via INTRAVENOUS
  Filled 2018-09-02: qty 20

## 2018-09-02 MED ORDER — DEXMEDETOMIDINE HCL IN NACL 400 MCG/100ML IV SOLN
0.0000 ug/kg/h | INTRAVENOUS | Status: DC
  Administered 2018-09-02: 0.4 ug/kg/h via INTRAVENOUS
  Administered 2018-09-03: 0.5 ug/kg/h via INTRAVENOUS
  Administered 2018-09-03: 0.7 ug/kg/h via INTRAVENOUS
  Administered 2018-09-04: 0.4 ug/kg/h via INTRAVENOUS
  Administered 2018-09-04: 0.8 ug/kg/h via INTRAVENOUS
  Administered 2018-09-04 – 2018-09-05 (×3): 1.2 ug/kg/h via INTRAVENOUS
  Administered 2018-09-05: 1.1 ug/kg/h via INTRAVENOUS
  Administered 2018-09-05: 1 ug/kg/h via INTRAVENOUS
  Filled 2018-09-02 (×16): qty 100

## 2018-09-02 MED ORDER — FOLIC ACID 5 MG/ML IJ SOLN
1.0000 mg | Freq: Every day | INTRAMUSCULAR | Status: DC
  Administered 2018-09-02 – 2018-09-13 (×12): 1 mg via INTRAVENOUS
  Filled 2018-09-02 (×13): qty 0.2

## 2018-09-02 MED ORDER — THIAMINE HCL 100 MG/ML IJ SOLN
100.0000 mg | Freq: Every day | INTRAMUSCULAR | Status: DC
  Administered 2018-09-02 – 2018-09-13 (×12): 100 mg via INTRAVENOUS
  Filled 2018-09-02 (×12): qty 2

## 2018-09-02 NOTE — Progress Notes (Signed)
42 year old man with known nonischemic cardiomyopathy EF of 20% , attributed to amphetamine use, admitted 10/16 with mixed cardiogenic and septic shock, respiratory culture showing Haemophilus influenza He required high-dose pressors and now has weaned off epinephrine, norepinephrine and vasopressin. He remains on milrinone which is being titrated based on C-ox Due to suspicion for acalculous cholecystitis, port drain was placed.  He remains critically ill with intermittent agitation, afebrile, on Versed drip and fentanyl drip.  Currently unresponsive RA SS -3, decreased breath sounds bilateral, minimal secretions, S1-S2 regular, 1+ edema, no JVD, mild icterus  Labs show stable creatinine at 1.7, LFTs are decreasing, leukocytosis is stable, platelets are low but stable Chest x-ray shows market improvement in infiltrates and effusions  Impression/plan Acute respiratory failure with hypoxia-ready to start spontaneous breathing trials, agitation as the main barrier now.  Acute encephalopathy-related to shock and ICU delirium, taper Versed to off and use Precedex with goal RA SS -1, may cause hypotension in the setting of cardiomyopathy.  Nonischemic cardiomyopathy/echogenic shock-milrinone being titrated by cardiology based on co- oximetry, last 67%. Continue diuresis as renal function will tolerate  Elevated LFTs-favor shock liver rather than a calculus cholecystitis, slowly trending down, monitor. Continue tube feeds  Haemophilus pneumonia-changed to single agent meropenem which can continued for 5 to 7 days based on clinical improvement Can discontinue stress dose steroids.  My critical care time x82m  Cyril Mourning MD. FCCP. [place] [facility] Pager [phone] If no response call [phone]   09/02/2018

## 2018-09-02 NOTE — Progress Notes (Signed)
Resent Co ox to respiratory and also informed them of repeat, will monitor for new results.

## 2018-09-02 NOTE — Progress Notes (Signed)
Pt weaned off of versed and fentanyl per request of MD. Pt currently sedated with 0.4 mcg of precedex.100cc of fentanyl wasted with Kenney Houseman RN.

## 2018-09-02 NOTE — Plan of Care (Signed)
  Problem: Clinical Measurements: Goal: Respiratory complications will improve Outcome: Progressing   Problem: Coping: Goal: Level of anxiety will decrease Outcome: Progressing   Problem: Elimination: Goal: Will not experience complications related to urinary retention Outcome: Progressing   Problem: Pain Managment: Goal: General experience of comfort will improve Outcome: Progressing   Problem: Safety: Goal: Ability to remain free from injury will improve Outcome: Progressing   Problem: Skin Integrity: Goal: Risk for impaired skin integrity will decrease Outcome: Progressing   

## 2018-09-02 NOTE — Progress Notes (Addendum)
Patient ID: Cody Patterson, male   DOB: 1976/03/07, 42 y.o.   MRN: [id number]     Advanced Heart Failure Rounding Note  PCP-Cardiologist: No primary care provider on file.   Subjective:    Remains on milrinone 0.375 and amio 60. Now off vaso, epi, and norepi. Remains in NSR with 2 short runs of NSVT (5 and 8 beats). K 4.3, mag 2.0. Swan removed yesterday.    Afebrile. WBC 20 > 17.9 > 14.9 > 16.6. +H influenzae in sputum. Remains on steroids and meropenum.  Abdominal US strongly suggestive of acute cholecystitis, not surgical candidate. Underwent percutaneous cholecystostomy 10/20. Bili 10.9 > 9.2  INR 2.10  Creatinine 3.09 => 2.16 => 1.92 => 1.7. Brisk UOP, but lots of IV intake. I/O negative 545 mls.   Remains intubated and sedated. FiO2 30%  Weight pending. CVP 5. 67%.   Sedation turned down. Some movement, but nothing to commands per RN.  Objective:   Weight Range: 105.1 kg Body mass index is 31.42 kg/m.   Vital Signs:   Temp:  [96.6 F (35.9 C)-98.2 F (36.8 C)] 98.1 F (36.7 C) (10/22 0400) Pulse Rate:  [79-95] 85 (10/22 0600) Resp:  [0-22] 12 (10/22 0600) BP: (94-123)/(60-96) 109/77 (10/22 0600) SpO2:  [96 %-100 %] 99 % (10/22 0600) Arterial Line BP: (100-132)/(68-94) 109/71 (10/22 0600) FiO2 (%):  [30 %] 30 % (10/22 0400) Last BM Date: 09/09/2018  Weight change: Filed Weights   08/30/18 0500 08/31/18 0500 09/01/18 0440  Weight: 104 kg 107.3 kg 105.1 kg    Intake/Output:   Intake/Output Summary (Last 24 hours) at 09/02/2018 0708 Last data filed at 09/02/2018 0600 Gross per 24 hour  Intake 3585.46 ml  Output 4130 ml  Net -544.54 ml      Physical Exam   General: Intubated/sedated.  HEENT: + ETT + OG tube with tube feeds Neck: Supple. JVP 5-6. Carotids 2+ bilat; no bruits. No thyromegaly or nodule noted. Right [place] TLC. Right IJ cordis Cor: PMI nondisplaced. RRR, No M/G/R noted Lungs: Diminished basilar sounds Abdomen: Soft, non-tender, non-distended,  no HSM. No bruits or masses. +BS RUQ choley drain Extremities: No cyanosis, clubbing, or rash. +1 BLE edema Neuro: Intubated/sedated  Telemetry   NSR 80s with 2 short runs NSVT (5 and 8 beats). personally reviewed.   EKG    No new tracings.  Labs    CBC Recent Labs    09/01/18 0356 09/02/18 0409  WBC 14.9* 16.6*  HGB 12.9* 13.8  HCT 39.7 41.3  MCV 85.0 83.6  PLT 109* 409*   Basic Metabolic Panel Recent Labs    09/01/18 0356  09/01/18 2305 09/02/18 0409  NA 138   < > 141 142  K 3.6   < > 3.6 4.3  CL 103   < > 102 106  CO2 29   < > 27 28  GLUCOSE 138*   < > 128* 149*  BUN 71*   < > 63* 67*  CREATININE 1.92*   < > 1.65* 1.70*  CALCIUM 7.8*   < > 7.9* 8.1*  MG 2.1  --  2.0 2.0  PHOS 4.3  --   --  4.1   < > = values in this interval not displayed.   Liver Function Tests Recent Labs    09/01/18 0356 09/02/18 0409  AST 267* 125*  ALT 2,214* 1,403*  ALKPHOS 117 108  BILITOT 10.8* 9.2*  PROT 5.4* 5.4*  ALBUMIN 2.4* 2.2*   No results for  input(s): LIPASE, AMYLASE in the last 72 hours. Cardiac Enzymes No results for input(s): CKTOTAL, CKMB, CKMBINDEX, TROPONINI in the last 72 hours.  BNP: BNP (last 3 results) No results for input(s): BNP in the last 8760 hours.  ProBNP (last 3 results) No results for input(s): PROBNP in the last 8760 hours.   D-Dimer No results for input(s): DDIMER in the last 72 hours. Hemoglobin A1C No results for input(s): HGBA1C in the last 72 hours. Fasting Lipid Panel No results for input(s): CHOL, HDL, LDLCALC, TRIG, CHOLHDL, LDLDIRECT in the last 72 hours. Thyroid Function Tests No results for input(s): TSH, T4TOTAL, T3FREE, THYROIDAB in the last 72 hours.  Invalid input(s): FREET3  Other results:   Imaging    No results found.   Medications:     Scheduled Medications: . chlorhexidine gluconate (MEDLINE KIT)  15 mL Mouth Rinse BID  . Chlorhexidine Gluconate Cloth  6 each Topical Daily  . enoxaparin (LOVENOX)  injection  30 mg Subcutaneous Q24H  . feeding supplement (VITAL HIGH PROTEIN)  1,000 mL Per Tube Q24H  . furosemide  60 mg Intravenous BID  . hydrocortisone sod succinate (SOLU-CORTEF) inj  50 mg Intravenous Q6H  . insulin aspart  0-9 Units Subcutaneous Q4H  . mouth rinse  15 mL Mouth Rinse 10 times per day  . sodium chloride flush  10-40 mL Intracatheter Q12H  . sodium chloride flush  5 mL Intracatheter Q8H    Infusions: . sodium chloride Stopped (08/23/2018 8182)  . sodium chloride 10 mL/hr at 09/02/18 0400  . sodium chloride Stopped (09/01/18 1234)  . amiodarone 60 mg/hr (09/02/18 0408)  . famotidine (PEPCID) IV Stopped (09/01/18 1025)  . fentaNYL infusion INTRAVENOUS 300 mcg/hr (09/02/18 0400)  . meropenem (MERREM) IV 2 g (09/02/18 0520)  . midazolam (VERSED) infusion 3 mg/hr (09/02/18 0400)  . milrinone 0.375 mcg/kg/min (09/02/18 0518)  . norepinephrine (LEVOPHED) Adult infusion Stopped (09/01/18 1314)    PRN Medications: midazolam, sodium chloride flush    Patient Profile   Cody Patterson is a 42 y.o. 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   Presented to [facility] 10/16 with worsening dyspnea. Became hypotensive and with respiratory distress requiring pressor support and intubation. Transferred to [facility] for treatment and further evaluation.   Assessment/Plan   1. Shock: Mixed septic and cardiogenic shock.  MAPs 80s, now off epinephrine, vaso, and norepi.  - H.flu in sputum. - Suspect primarily septic shock.  2. Acute on chronic systolic CHF:  Echocardiogram from [facility] 10/16: LV moderately dilated, severely impaired EF at 10%, moderate RV dysfunction, moderately elevated RVSP.  Nonischemic cardiomyopathy pre-existed this hospitalization (had cath at [facility]).  Suspect cardiomyopathy is due to substance abuse (amphetamines).   - As above, titrating down norepinephrine.  - Continue milrinone to 0.375. Coox pending. CVP 5, weight  still up ~30 lbs. - Continue Lasix 60 mg IV bid.  - He is not candidate for VAD or transplant with active substance abuse.  3. Acute respiratory failure: Remains intubated and sedated. Suspect PNA. No change. P/CCM managing. Sputum + H influenzae 4. Polysubstance abuse: UDS + amphetamines and THC. No change.  5. ID: Septic shock.  Has H influenzae in sputum, suspect PNA.  He also appears to have acute cholecystitis on abdominal US.  - Continue meropenem. Afebrile. WBC trending up. Remains on steroids.   - S/p percutaneous cholecystostomy tube 10/20 (per CCM). See below. 6. Transaminitis: Elevated LFTs.  Suspect ischemic hepatitis  with shock + acute cholecystitis. Transaminases trending down but tbili rising, likely in setting of acute cholecystitis.  7. Ileus: KUB 09/04/2018 with large illeus in the setting of stool coming through OG tube.  - Improved.  8. Atrial fibrillation, paroxysmal: Patient went into atrial fibrillation with RVR Saturday night and amio drip was increased. Remains in NSR - INR elevated with liver failure, will not add heparin gtt. INR 2.10 (received vitamin K 10/19 and 10/20 for choley drain)  - Continue amiodarone gtt 60 mg/hr. May be able to decrease today if we go down on milrinone (coox pending) 9. Acute cholecystitis: - s/p perc drain. Leave in for 6-8 weeks and determine if he becomes a surgical candidate for surgery note.   Length of Stay: [place], NP  09/02/2018, 7:08 AM  Advanced Heart Failure Team Pager [phone] (M-F; 7a - 4p)  Please contact [facility] for night-coverage after hours (4p -7a ) and weekends on [email or web address]  Agree with above.   Remains intubated sedated. On FiO2 30%. Of pressors except for milrinone 0.375. Co-ox 67%  CVP low but weight still markedly up with 3rd spacing. Mild drainage from perc c-tube. Rhythm NSR on amio.   Intubate/sedated  R [place] line. RIJ introducer Cor RRR Lungs CTA Ab soft NT Ext 2-3+ edema  Remains  tenuous but improving. Mental status major issue now. Will wean milrinone to 0.25. Follow co-ox. Has lots of 3rd spacing. Switch to IV lasix. D/w CCM hopefully can extubate soon when/if mental status improves.   CRITICAL CARE Performed by: Glori Bickers  Total critical care time: 35 minutes  Critical care time was exclusive of separately billable procedures and treating other patients.  Critical care was necessary to treat or prevent imminent or life-threatening deterioration.  Critical care was time spent personally by me (independent of midlevel providers or residents) on the following activities: development of treatment plan with patient and/or surrogate as well as nursing, discussions with consultants, evaluation of patient's response to treatment, examination of patient, obtaining history from patient or surrogate, ordering and performing treatments and interventions, ordering and review of laboratory studies, ordering and review of radiographic studies, pulse oximetry and re-evaluation of patient's condition.  Glori Bickers, MD  3:32 PM

## 2018-09-02 NOTE — Progress Notes (Signed)
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 Presented to [facility] with increasing dyspnea. Acute decompensation with hypotension and severe respiratory distress requiring intubation. Transferred to [facility] for management of sepsis, advanced heart failure.  Growing H influenza in sputum.  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  Past Medical History  Known cardiomyopathy and drug abuse (THC and methamphetamines)  Significant Hospital Events   10/16- Intubated at [facility] 10/17- Worsening shock, maxed on pressors, right heart cath indicative of distributive shock, high output failure 10/18-Started on milrinone, lasix for reduced cardiac output. PA numbers show worsening CO and high wedge 10/19- Started making urine, pressors weaning down, continues on milrinone. Surgery consulted for cholecystitis 10/20- Mucus plug overnight requiring bag, lavage. Went into afib  Consults: date of consult/date signed off & final recs:  10/17- Cardiology  10/19- Surgery signed on 09/02/2017  Procedures (surgical and bedside):  10/16 [employer] R subclavian CVC  10/16 [facility] L femoral arterial line  10/17 PA catheter  Significant Diagnostic Tests:  Echocardiogram 10/17- Mild to moderate LV dilation with EF 20%, diffuse hypokinesis.   Restrictive diastolic function. Mildly dilated RV with mildly   decreased systolic function. No significant valvular   abnormalities  Abdominal ultrasound 08/29/2018- severe gallbladder thickening, sludge,  cholelithiasis, small volume of perihepatic, pericholecystic and perinephric free fluid.Marland Kitchen  UDS [facility] Sal[facility] 10/16- Amphetamines, THC  Swan ganz 10/18 Initial numbers RA 10 PA 46/33 (41) PCWP 10 Thermo CO/CI  15.2/7.0 Fick 14.1/6.5 (co-ox 86%) SVR 231  09/02/2018 chest x-ray reviewed shows endotracheal tube central line in good position edema bilaterally  Micro Data:  Bcx 10/17 >> ng Trach aspirate 10/17- H influenza  Antimicrobials:  Vancomycin 10/17 > off Meropenen 10/17 > Aztreonam 10/17>> off  Subjective:   Off vasopressor support Remains agitated when not sedated Remains on amiodarone drip currently sinus rhythm We will attempt weaning from ventilator  Objective   Blood pressure (!) 123/95, pulse (!) 101, temperature 98.1 F (36.7 C), temperature source Oral, resp. rate 14, height 6' (1.829 m), weight 105.1 kg, SpO2 93 %. PAP: (27-39)/(18-28) 39/28 CVP:  [1 mmHg-23 mmHg] 23 mmHg CO:  [6.1 L/min-10 L/min] 6.1 L/min  Vent Mode: PRVC FiO2 (%):  [30 %] 30 % Set Rate:  [14 bmp] 14 bmp Vt Set:  [540 mL] 540 mL PEEP:  [5 cmH20] 5 cmH20 Plateau Pressure:  [8 cmH20-17 cmH20] 15 cmH20   Intake/Output Summary (Last 24 hours) at 09/02/2018 0956 Last data filed at 09/02/2018 0900 Gross per 24 hour  Intake 3715.48 ml  Output 3530 ml  Net 185.48 ml   Filed Weights   08/30/18 0500 08/31/18 0500 09/01/18 0440  Weight: 104 kg 107.3 kg 105.1 kg   Examination: General: Well-nourished well-developed male elucidated HEENT: Endotracheal tube ventilator, gastric tube in place, pupils equal light pinpoint Neuro: Currently have a sedated reported to be combative sedation decreased CV: Heart sounds are regular heart rate of 94 PULM: Coarse rhonchi of the right WU:JWJX, non-tender, bsx4 active  Extremities: warm/dry, 2+ edema  Skin: no rashes or lesions   Resolved Hospital Problem list  Assessment & Plan:  Severe shock secondary to H. influenzae pneumonia in setting of  decompensated nonischemic cardiomyopathy  Shock- Cardiogenic, septic combination Severe cardiomyopathy, noncompliant V tach, A. fib -Currently off all vasopressor support -Amiodarone drip currently sinus rhythm -Diuresis as tolerated -Cardiology following  Acute respiratory failure secondary to pulmonary edema and H. influenzae pneumonia -Wean per protocol -wake-up assessment as tolerated -Cardiomyopathy may impede extubation  Acute metabolic encephalopathy-related to sedation -10/20 Precede2/2019 we will attempt to come off of Versed IV drip changed to as needed IV bolus -09/02/2018 start Precedex -09/02/2017 for concern of  Severe sepsis-H. influenzae pneumonia,?  Acute cholecystitis 09/02/2017 off vasopressor support Change to single agent meropenem,  -Stress dose steroids decreased to 50 mg every 12 hours on 09/01/1999 night -Follow culture data   Renal failure > improving Lab Results  Component Value Date   CREATININE 1.70 (H) 09/02/2018   CREATININE 1.65 (H) 09/01/2018   CREATININE 1.65 (H) 09/01/2018    Intake/Output Summary (Last 24 hours) at 09/02/2018 1004 Last data filed at 09/02/2018 0900 Gross per 24 hour  Intake 3616.14 ml  Output 3485 ml  Net 131.14 ml     Continue to follow urine output and creatinine Diuresis is able, note positive I/O despite Lasix   Elevated LFTs, INR secondary to shock liver Doubt acute cholecystitis status post perc drain Coagulopathy, elevated INR s/p vit K x 2 Lab Results  Component Value Date   INR 2.10 09/02/2018   INR 2.33 09/01/2018   INR 2.65 08/31/2018    Monitor LFTs Continue with percutaneous drain Surgical consult has signed off from 09/02/2018    Disposition / Summary of Today's Plan 09/02/18   Off vasopressor support Add Precedex and attempt to wean Versed and fentanyl Attempt spontaneous breathing trial Continue to treat for H. Influenzae Added thiamine and folic acid for completeness  Best  Practice    Code Status: full Family Communication: 09/02/2018 wife at bedside asleep.  Labs personally reviewed.  CBC: reactive leukocytosis. Recent Labs  Lab 08/29/18 0330 08/29/18 1813 08/30/18 0310 08/31/18 0413 09/01/18 0356 09/02/18 0409  WBC 23.9*  --  20.0* 17.9* 14.9* 16.6*  HGB 12.5* 13.6 12.6* 12.6* 12.9* 13.8  HCT 38.1* 40.0 38.5* 38.1* 39.7 41.3  MCV 85.6  --  84.1 83.2 85.0 83.6  PLT 193  --  160 143* 109* 108*    Basic Metabolic Panel: pending Recent Labs  Lab 08/29/18 0330  08/30/18 0310 08/31/18 0413 08/31/18 2331 09/01/18 0356 09/01/18 1338 09/01/18 2305 09/02/18 0409  NA 131*   < > 133* 136 137 138 141 141 142  K 4.3   < > 4.1 3.6 3.5 3.6 3.4* 3.6 4.3  CL 97*   < > 95* 99 104 103 104 102 106  CO2 20*  --  24 27 25 29 29 27 28   GLUCOSE 201*   < > 147* 150* 139* 138* 125* 128* 149*  BUN 41*   < > 59* 69* 70* 71* 63* 63* 67*  CREATININE 3.67*   < > 3.09* 2.16* 1.91* 1.92* 1.65* 1.65* 1.70*  CALCIUM 6.7*  --  7.5* 7.7* 7.5* 7.8* 7.6* 7.9* 8.1*  MG 1.8  --  2.2 2.0  --  2.1  --  2.0 2.0  PHOS 5.6*  --  4.6 3.1  --  4.3  --   --  4.1   < > = values in this interval not displayed.   GFR: Estimated Creatinine Clearance: 72.4 mL/min (A) (by C-G  formula based on SCr of 1.7 mg/dL (H)). Recent Labs  Lab 09-23-18 0830  09-23-18 1849 08/29/18 0330 08/29/18 0744 08/30/18 0310 08/31/18 0413 09/01/18 0356 09/02/18 0409  PROCALCITON 27.23  --   --  22.85  --  14.68  --   --   --   WBC  --   --   --  23.9*  --  20.0* 17.9* 14.9* 16.6*  LATICACIDVEN  --    < > 8.7*  --  3.4* 3.1*  --  1.5  --    < > = values in this interval not displayed.    Liver Function Tests: Recent Labs  Lab 08/29/18 0841 08/30/18 0310 08/30/18 1054 08/31/18 0413 09/01/18 0356 09/02/18 0409  AST 8,606* 3,308*  --  848* 267* 125*  ALT 6,915* 5,164*  --  3,316* 2,214* 1,403*  ALKPHOS 135* 127*  --  118 117 108  BILITOT 6.9* 7.7* 7.9* 9.7* 10.8* 9.2*  PROT 5.6* 5.5*  --   5.6* 5.4* 5.4*  ALBUMIN 2.8* 2.6*  --  2.5* 2.4* 2.2*   No results for input(s): LIPASE, AMYLASE in the last 168 hours. No results for input(s): AMMONIA in the last 168 hours.  ABG    Component Value Date/Time   PHART 7.440 09/01/2018 0408   PCO2ART 45.9 09/01/2018 0408   PO2ART 130.0 (H) 09/01/2018 0408   HCO3 31.3 (H) 09/01/2018 0408   TCO2 33 (H) 09/01/2018 0408   ACIDBASEDEF 0.8 08/29/2018 0340   O2SAT 67.3 09/02/2018 0730     Coagulation Profile: Recent Labs  Lab 09/04/2018 1851 08/30/18 0310 08/31/18 0413 09/01/18 0356 09/02/18 0409  INR 2.58 3.40 2.65 2.33 2.10    Cardiac Enzymes: Recent Labs  Lab 08/29/18 0841 08/30/18 0310  TROPONINI 1.12* 0.65*    HbA1C: No results found for: HGBA1C  CBG: Recent Labs  Lab 09/01/18 1604 09/01/18 2014 09/01/18 2323 09/02/18 0424 09/02/18 0849  GLUCAP 111* 122* 115* 126* 111*    App cct 35 min  Brett Canales Mckenleigh Tarlton ACNP Adolph Pollack PCCM Pager [phone] till 1 pm If no answer page [phone] 09/02/2018, 9:56 AM

## 2018-09-02 NOTE — Discharge Instructions (Signed)
Cholecystostomy, Care After Refer to this sheet in the next few weeks. These instructions provide you with information about caring for yourself after your procedure. Your health care provider may also give you more specific instructions. Your treatment has been planned according to current medical practices, but problems sometimes occur. Call your health care provider if you have any problems or questions after your procedure. What can I expect after the procedure? After your procedure, it is common to have soreness near the site of your drainage tube (catheter) or your incision. Follow these instructions at home: Incision care  Follow instructions from your health care provider about how to take care of your incision. Make sure you: ? Wash your hands with soap and water before you change your bandage (dressing). If soap and water are not available, use hand sanitizer. ? Change your dressing as told by your health care provider. ? Leave stitches (sutures), skin glue, or adhesive strips in place. These skin closures may need to be in place for 2 weeks or longer. If adhesive strip edges start to loosen and curl up, you may trim the loose edges. Do not remove adhesive strips completely unless your health care provider tells you to do that.  Check your incision and your drainage site every day for signs of infection. Watch for: ? Redness, swelling, or pain. ? Fluid, blood, or pus. General instructions  If you were sent home with a surgical drain in place, follow instructions from your health care provider about how to care for your drain and collection bag at home.  Do not take baths, swim, or use a hot tub until your health care provider approves. Ask your health care provider if you can take showers. You may only be allowed to take sponge baths for bathing.  Follow instructions from your health care provider about what you may eat or drink.  Take over-the-counter and prescription medicines only  as told by your health care provider.  Keep all follow-up visits as told by your health care provider. This is important. Contact a health care provider if:  You have redness, swelling, or pain at your incision or drainage site.  You have nausea or vomiting. Get help right away if:  Your abdominal pain gets worse.  You feel dizzy or you faint while standing.  You have fluid, blood, or pus coming from your incision or drainage site.  You have a fever.  You have shortness of breath.  You have a rapid heartbeat.  Your nausea or vomiting does not go away.  Your drainage tube becomes blocked.  Your drainage tube comes out of your abdomen. This information is not intended to replace advice given to you by your health care provider. Make sure you discuss any questions you have with your health care provider. Document Released: 07/20/2015 Document Revised: 04/05/2016 Document Reviewed: 02/09/2015 Elsevier Interactive Patient Education  2018 Elsevier Inc.  

## 2018-09-02 NOTE — Progress Notes (Signed)
Referring Physician(s): Dr. Isaiah Serge  Supervising Physician: Oley Balm  Patient Status:  [facility] - In-pt  Chief Complaint: Cholecystitis  Subjective: Remains intubated.  Pressors and fentanyl turned down. Cholecystostomy remains in place.   Allergies: Amoxicillin and Penicillins  Medications: Prior to Admission medications   Not on File     Vital Signs: BP (!) 123/95   Pulse (!) 101   Temp 98.1 F (36.7 C) (Oral)   Resp 14   Ht 6' (1.829 m)   Wt 231 lb 11.3 oz (105.1 kg)   SpO2 93%   BMI 31.42 kg/m   Physical Exam  NAD, intubated, sedated.  Abdomen: Soft, non-distended.  Cholecystostomy in place.  Insertion site c/d/i.  Dark, bilious output in collection bag.  50 mL recorded overnight.   Imaging: US Abdomen Complete  Result Date: 08/29/2018 CLINICAL DATA:  42 year old male with renal failure, elevated LFTs. EXAM: ABDOMEN ULTRASOUND COMPLETE COMPARISON:  CT Abdomen and Pelvis 07/12/2014. FINDINGS: Gallbladder: Floating gallstone measuring 19 millimeters is noted on image 4. There is abundant superimposed dependent gallbladder sludge. There is moderate to severe gallbladder wall thickening up to 13 millimeters. There is trace pericholecystic fluid. However, no sonographic Murphy sign was elicited. Common bile duct: Diameter: 3 millimeters, normal. Liver: Trace perihepatic free fluid. No focal lesion identified. Within normal limits in parenchymal echogenicity. Portal vein is patent on color Doppler imaging with normal direction of blood flow towards the liver. IVC: Incompletely visualized due to overlying bowel gas, visualized portions within normal limits. Pancreas: Visualized portion unremarkable. Spleen: Size and appearance within normal limits. Right Kidney: Length: 14.4 centimeters. Echogenicity within normal limits. No mass or hydronephrosis visualized. Small volume of right perinephric free fluid (image 88). Left Kidney: Length: 13.2 centimeters. Echogenicity  within normal limits. No mass or hydronephrosis visualized. Small volume of left perinephric fluid suspected on image 105. Abdominal aorta: No aneurysm visualized. Other findings: None. IMPRESSION: 1. Abnormal gallbladder with severe wall thickening, sludge, and cholelithiasis. Despite absent sonographic Murphy sign ACUTE CHOLECYSTITIS should be considered. 2. Small volume of perihepatic and pericholecystic free fluid. No evidence of bile duct obstruction. 3. Small volume of bilateral perinephric free fluid which may relate to renal failure. No hydronephrosis and otherwise negative ultrasound appearance of both kidneys. Electronically Signed   By: Odessa Fleming M.D.   On: 08/29/2018 11:19   Ir Perc Cholecystostomy  Result Date: 08/31/2018 INDICATION: 42 year old male with a history of acute cholecystitis, sepsis, shock, multi organ failure EXAM: PERCUTANEOUS CHOLECYSTOSTOMY MEDICATIONS: Multiple ICU medications including antibiotics ANESTHESIA/SEDATION: None. FLUOROSCOPY TIME:  Fluoroscopy Time: 0 minutes 18 seconds (3 mGy). COMPLICATIONS: None PROCEDURE: Informed written consent was obtained from the patient's family after a thorough discussion of the procedural risks, benefits and alternatives. All questions were addressed. Maximal Sterile Barrier Technique was utilized including caps, mask, sterile gowns, sterile gloves, sterile drape, hand hygiene and skin antiseptic. A timeout was performed prior to the initiation of the procedure. Ultrasound survey of the right upper quadrant was performed for planning purposes. Once the patient is prepped and draped in the usual sterile fashion, the skin and subcutaneous tissues overlying the gallbladder were generously infiltrated 1% lidocaine for local anesthesia. A coaxial needle was advanced under ultrasound guidance through the skin subcutaneous tissues and a small segment of liver into the gallbladder lumen. With removal of the stylet, spontaneous dark bile drainage  occurred. Using modified Seldinger technique, a 10 French drain was placed into the gallbladder fossa, with aspiration of the sample for the  lab. Contrast injection confirmed position of the tube within the gallbladder lumen. Drainage catheter was attached to gravity drain with a suture retention placed. Patient tolerated the procedure well and remained hemodynamically stable throughout. No complications were encountered and no significant blood loss encountered. IMPRESSION: Status post image guided percutaneous cholecystostomy. Signed, Yvone Neu. Reyne Dumas, RPVI [facility] Electronically Signed   By: Gilmer Mor D.O.   On: 08/31/2018 11:22   Dg Chest Port 1 View  Result Date: 09/02/2018 CLINICAL DATA:  Endotracheal tube check EXAM: PORTABLE CHEST 1 VIEW COMPARISON:  09/01/2018 FINDINGS: Endotracheal tube is 5.7 cm from carina retraction of Swan-Ganz catheter. Central venous line and sheath remain. No pneumothorax. No pulmonary edema. IMPRESSION: 1. Endotracheal tube in good position. 2. Retraction of Swan-Ganz catheter. 3. No edema or pneumothorax. Electronically Signed   By: Genevive Bi M.D.   On: 09/02/2018 10:14   Dg Chest Port 1 View  Result Date: 09/01/2018 CLINICAL DATA:  Hypoxia EXAM: PORTABLE CHEST 1 VIEW COMPARISON:  August 31, 2018 FINDINGS: Endotracheal tube tip is 4.5 cm above the carina. Central catheter tip is in the main pulmonary outflow tract. Right subclavian catheter tip is in the superior vena cava near the cavoatrial junction. Nasogastric tube tip and side port are below the diaphragm. No pneumothorax. There is a nipple shadow on the left. There is no edema or consolidation. Heart is borderline enlarged with pulmonary vascularity normal. No adenopathy. No bone lesions. IMPRESSION: Tube and catheter positions as described without pneumothorax. No edema or consolidation. Mild cardiac prominence. Electronically Signed    By: Bretta Bang III M.D.   On: 09/01/2018 08:02   Dg Chest Port 1 View  Result Date: 08/31/2018 CLINICAL DATA:  Acute respiratory failure. EXAM: PORTABLE CHEST 1 VIEW COMPARISON:  None. FINDINGS: Endotracheal tube, nasogastric 2, right subclavian central venous catheter and right IJ Swan-Ganz catheter are unchanged. Lungs are adequately inflated with stable left base/retrocardiac hazy opacification suggesting small left effusion with associated atelectasis. Mild stable cardiomegaly. Remainder of the exam is unchanged. IMPRESSION: Stable hazy left base opacification likely small effusion with atelectasis. Tubes and lines as described. Electronically Signed   By: Elberta Fortis M.D.   On: 08/31/2018 14:18   Dg Chest Port 1 View  Result Date: 08/30/2018 CLINICAL DATA:  Acute respiratory failure. EXAM: PORTABLE CHEST 1 VIEW COMPARISON:  One-view chest x-ray 08/29/2018 FINDINGS: Heart is enlarged. Endotracheal tube is scratched at the endotracheal tube terminates 6 cm above the carina, in satisfactory position. The tip of a Swan-Ganz catheter is in the main pulmonary outflow tract. The side port of the NG tube is in the stomach. For bladder pads are in place. The warming/cooling blanket is in place. Mild edema is stable. Left basilar airspace disease is noted. IMPRESSION: 1. Cardiomegaly with mild edema. 2. Left basilar airspace disease likely reflects atelectasis. Infection is not excluded. 3. Support apparatus is stable and in satisfactory position. Electronically Signed   By: Marin Roberts M.D.   On: 08/30/2018 08:26    Labs:  CBC: Recent Labs    08/30/18 0310 08/31/18 0413 09/01/18 0356 09/02/18 0409  WBC 20.0* 17.9* 14.9* 16.6*  HGB 12.6* 12.6* 12.9* 13.8  HCT 38.5* 38.1* 39.7 41.3  PLT 160 143* 109* 108*    COAGS: Recent Labs    08/18/2018 1851 08/30/18 0310 08/31/18 0413 09/01/18 0356 09/02/18 0409  INR 2.58 3.40 2.65 2.33 2.10  APTT 70*  --   --   --   --  BMP: Recent Labs    09/01/18 0356 09/01/18 1338 09/01/18 2305 09/02/18 0409  NA 138 141 141 142  K 3.6 3.4* 3.6 4.3  CL 103 104 102 106  CO2 29 29 27 28   GLUCOSE 138* 125* 128* 149*  BUN 71* 63* 63* 67*  CALCIUM 7.8* 7.6* 7.9* 8.1*  CREATININE 1.92* 1.65* 1.65* 1.70*  GFRNONAA 42* 50* 50* 49*  GFRAA 49* 59* 59* 56*    LIVER FUNCTION TESTS: Recent Labs    08/30/18 0310 08/30/18 1054 08/31/18 0413 09/01/18 0356 09/02/18 0409  BILITOT 7.7* 7.9* 9.7* 10.8* 9.2*  AST 3,308*  --  848* 267* 125*  ALT 5,164*  --  3,316* 2,214* 1,403*  ALKPHOS 127*  --  118 117 108  PROT 5.5*  --  5.6* 5.4* 5.4*  ALBUMIN 2.6*  --  2.5* 2.4* 2.2*    Assessment and Plan: Cholecystitis s/p percutaneous cholecystostomy tube placement by Dr. Loreta Ave 10/20 Remains intubated, sedated. T bili slightly decreased today (10.8  9.2) Dark bilious output.  Cultures pending but NGTD. Continue current management.  IR to follow.   Electronically Signed: Hoyt Koch, PA 09/02/2018, 10:30 AM   I spent a total of 15 Minutes at the the patient's bedside AND on the patient's hospital floor or unit, greater than 50% of which was counseling/coordinating care for cholecystitis.

## 2018-09-02 NOTE — Progress Notes (Signed)
Patient ID: Cody Patterson, male   DOB: 1976-03-20, 42 y.o.   MRN: [id number]       Subjective: Patient sedated on vent.  Appears to be off pressor support.  Objective: Vital signs in last 24 hours: Temp:  [96.6 F (35.9 C)-98.2 F (36.8 C)] 98.1 F (36.7 C) (10/22 0400) Pulse Rate:  [79-95] 89 (10/22 0734) Resp:  [0-22] 14 (10/22 0734) BP: (94-123)/(60-96) 121/83 (10/22 0734) SpO2:  [96 %-100 %] 97 % (10/22 0734) Arterial Line BP: (100-132)/(68-94) 109/71 (10/22 0600) FiO2 (%):  [30 %] 30 % (10/22 0734) Last BM Date: 09/10/2018  Intake/Output from previous day: 10/21 0701 - 10/22 0700 In: 3585.5 [I.V.:1824.4; IV Piggyback:1751] Out: 4130 [Urine:4040; Drains:90] Intake/Output this shift: No intake/output data recorded.  PE: Heart: regular Lungs: coarse BS Abd: soft, +BS, OG with tube feeds running, perc chole drain in place with bilious output  Lab Results:  Recent Labs    09/01/18 0356 09/02/18 0409  WBC 14.9* 16.6*  HGB 12.9* 13.8  HCT 39.7 41.3  PLT 109* 108*   BMET Recent Labs    09/01/18 2305 09/02/18 0409  NA 141 142  K 3.6 4.3  CL 102 106  CO2 27 28  GLUCOSE 128* 149*  BUN 63* 67*  CREATININE 1.65* 1.70*  CALCIUM 7.9* 8.1*   PT/INR Recent Labs    09/01/18 0356 09/02/18 0409  LABPROT 25.3* 23.3*  INR 2.33 2.10   CMP     Component Value Date/Time   NA 142 09/02/2018 0409   K 4.3 09/02/2018 0409   CL 106 09/02/2018 0409   CO2 28 09/02/2018 0409   GLUCOSE 149 (H) 09/02/2018 0409   BUN 67 (H) 09/02/2018 0409   CREATININE 1.70 (H) 09/02/2018 0409   CALCIUM 8.1 (L) 09/02/2018 0409   PROT 5.4 (L) 09/02/2018 0409   ALBUMIN 2.2 (L) 09/02/2018 0409   AST 125 (H) 09/02/2018 0409   ALT 1,403 (H) 09/02/2018 0409   ALKPHOS 108 09/02/2018 0409   BILITOT 9.2 (H) 09/02/2018 0409   GFRNONAA 49 (L) 09/02/2018 0409   GFRAA 56 (L) 09/02/2018 0409   Lipase  No results found for: LIPASE     Studies/Results: Ir Perc Cholecystostomy  Result Date:  08/31/2018 INDICATION: 42 year old male with a history of acute cholecystitis, sepsis, shock, multi organ failure EXAM: PERCUTANEOUS CHOLECYSTOSTOMY MEDICATIONS: Multiple ICU medications including antibiotics ANESTHESIA/SEDATION: None. FLUOROSCOPY TIME:  Fluoroscopy Time: 0 minutes 18 seconds (3 mGy). COMPLICATIONS: None PROCEDURE: Informed written consent was obtained from the patient's family after a thorough discussion of the procedural risks, benefits and alternatives. All questions were addressed. Maximal Sterile Barrier Technique was utilized including caps, mask, sterile gowns, sterile gloves, sterile drape, hand hygiene and skin antiseptic. A timeout was performed prior to the initiation of the procedure. Ultrasound survey of the right upper quadrant was performed for planning purposes. Once the patient is prepped and draped in the usual sterile fashion, the skin and subcutaneous tissues overlying the gallbladder were generously infiltrated 1% lidocaine for local anesthesia. A coaxial needle was advanced under ultrasound guidance through the skin subcutaneous tissues and a small segment of liver into the gallbladder lumen. With removal of the stylet, spontaneous dark bile drainage occurred. Using modified Seldinger technique, a 10 French drain was placed into the gallbladder fossa, with aspiration of the sample for the lab. Contrast injection confirmed position of the tube within the gallbladder lumen. Drainage catheter was attached to gravity drain with a suture retention placed. Patient tolerated the procedure  well and remained hemodynamically stable throughout. No complications were encountered and no significant blood loss encountered. IMPRESSION: Status post image guided percutaneous cholecystostomy. Signed, Yvone Neu. Reyne Dumas, RPVI [facility] Radiology Electronically Signed   By: Gilmer Mor D.O.   On: 08/31/2018 11:22   Dg Chest Port 1  View  Result Date: 09/01/2018 CLINICAL DATA:  Hypoxia EXAM: PORTABLE CHEST 1 VIEW COMPARISON:  August 31, 2018 FINDINGS: Endotracheal tube tip is 4.5 cm above the carina. Central catheter tip is in the main pulmonary outflow tract. Right subclavian catheter tip is in the superior vena cava near the cavoatrial junction. Nasogastric tube tip and side port are below the diaphragm. No pneumothorax. There is a nipple shadow on the left. There is no edema or consolidation. Heart is borderline enlarged with pulmonary vascularity normal. No adenopathy. No bone lesions. IMPRESSION: Tube and catheter positions as described without pneumothorax. No edema or consolidation. Mild cardiac prominence. Electronically Signed   By: Bretta Bang III M.D.   On: 09/01/2018 08:02   Dg Chest Port 1 View  Result Date: 08/31/2018 CLINICAL DATA:  Acute respiratory failure. EXAM: PORTABLE CHEST 1 VIEW COMPARISON:  None. FINDINGS: Endotracheal tube, nasogastric 2, right subclavian central venous catheter and right IJ Swan-Ganz catheter are unchanged. Lungs are adequately inflated with stable left base/retrocardiac hazy opacification suggesting small left effusion with associated atelectasis. Mild stable cardiomegaly. Remainder of the exam is unchanged. IMPRESSION: Stable hazy left base opacification likely small effusion with atelectasis. Tubes and lines as described. Electronically Signed   By: Elberta Fortis M.D.   On: 08/31/2018 14:18    Anti-infectives: Anti-infectives (From admission, onward)   Start     Dose/Rate Route Frequency Ordered Stop   08/31/18 2200  meropenem (MERREM) 2 g in sodium chloride 0.9 % 100 mL IVPB     2 g 200 mL/hr over 30 Minutes Intravenous Every 8 hours 08/31/18 1239     08/30/18 0730  vancomycin (VANCOCIN) 1,250 mg in sodium chloride 0.9 % 250 mL IVPB  Status:  Discontinued     1,250 mg 166.7 mL/hr over 90 Minutes Intravenous Every 24 hours 08/30/18 0728 08/30/18 0803   08/30/18 0600   vancomycin (VANCOCIN) 1,250 mg in sodium chloride 0.9 % 250 mL IVPB  Status:  Discontinued     1,250 mg 166.7 mL/hr over 90 Minutes Intravenous Every 24 hours 08/30/18 0500 09/01/18 1027   07-Sep-2018 1800  vancomycin (VANCOCIN) IVPB 750 mg/150 ml premix  Status:  Discontinued     750 mg 150 mL/hr over 60 Minutes Intravenous Every 12 hours 2018/09/07 0446 09/07/2018 1445   09/07/18 1445  vancomycin variable dose per unstable renal function (pharmacist dosing)  Status:  Discontinued      Does not apply See admin instructions 2018-09-07 1445 09/01/18 1101   09-07-18 1000  meropenem (MERREM) 2 g in sodium chloride 0.9 % 100 mL IVPB  Status:  Discontinued     2 g 200 mL/hr over 30 Minutes Intravenous Every 12 hours 2018-09-07 0831 08/31/18 1239   07-Sep-2018 0700  aztreonam (AZACTAM) 1 g in sodium chloride 0.9 % 100 mL IVPB  Status:  Discontinued     1 g 200 mL/hr over 30 Minutes Intravenous Every 8 hours 09-07-2018 0655 Sep 07, 2018 0831   07-Sep-2018 0530  vancomycin (VANCOCIN) 1,500 mg in sodium chloride 0.9 % 500 mL IVPB     1,500 mg 250 mL/hr over 120 Minutes Intravenous  Once September 07, 2018 0446 08/29/18 0716  08/24/2018 0515  aztreonam (AZACTAM) 1 g in sodium chloride 0.9 % 100 mL IVPB  Status:  Discontinued     1 g 200 mL/hr over 30 Minutes Intravenous Every 8 hours 09/04/2018 0446 09/06/2018 0655       Assessment/Plan Cardiogenic shock Systolic heart failure with EF of 10% Acute renal failure Acute liver failure Polysubstance abuse Noncompliance  Sepsis with shock  Probable cholecystitis/cholelithiasis -s/p perc chole drain on 10-20 by IR -cont abx therapy, likely needs about a week for his gb -leave drain for at least 6-8 weeks.  Will have to determine if patient is a surgical candidate as weeks progress.  If his heart function were to not improve and his overall condition remain poor, he may not likely be a surgical candidate.  Time will tell. -LFTs are trending down some.  Some component of cholestasis  and shock liver suspected along with cholecystitis contributing to elevation, but these are slowly improving. -ok for TFs and diet once extubated.  He will need to follow up with [place] in about 5-6 weeks once discharged.  No further acute surgical needs at this time.  His cholecystitis is managed appropriately with his drain in place.  We will sign off.  Please call if questions or needs arise.   FEN - IVF/TFs VTE - SCDs/ auto-anticoagulated INR 2.1, defer to primary service ID - Merrem/Vanc   LOS: 6 days    Letha Cape , [facility] Surgery 09/02/2018, 7:41 AM Pager: [phone]

## 2018-09-03 ENCOUNTER — Inpatient Hospital Stay (HOSPITAL_COMMUNITY): Payer: Medicaid Other

## 2018-09-03 DIAGNOSIS — J9621 Acute and chronic respiratory failure with hypoxia: Secondary | ICD-10-CM

## 2018-09-03 LAB — CBC WITH DIFFERENTIAL/PLATELET
Abs Immature Granulocytes: 0.15 10*3/uL — ABNORMAL HIGH (ref 0.00–0.07)
BASOS ABS: 0 10*3/uL (ref 0.0–0.1)
Basophils Relative: 0 %
Eosinophils Absolute: 0 10*3/uL (ref 0.0–0.5)
Eosinophils Relative: 0 %
HCT: 45 % (ref 39.0–52.0)
HEMOGLOBIN: 14.7 g/dL (ref 13.0–17.0)
IMMATURE GRANULOCYTES: 1 %
LYMPHS PCT: 7 %
Lymphs Abs: 1.2 10*3/uL (ref 0.7–4.0)
MCH: 27.8 pg (ref 26.0–34.0)
MCHC: 32.7 g/dL (ref 30.0–36.0)
MCV: 85.2 fL (ref 80.0–100.0)
MONO ABS: 1.5 10*3/uL — AB (ref 0.1–1.0)
Monocytes Relative: 8 %
Neutro Abs: 15 10*3/uL — ABNORMAL HIGH (ref 1.7–7.7)
Neutrophils Relative %: 84 %
PLATELETS: 131 10*3/uL — AB (ref 150–400)
RBC: 5.28 MIL/uL (ref 4.22–5.81)
RDW: 20.3 % — ABNORMAL HIGH (ref 11.5–15.5)
WBC: 17.9 10*3/uL — AB (ref 4.0–10.5)
nRBC: 0.2 % (ref 0.0–0.2)

## 2018-09-03 LAB — GLUCOSE, CAPILLARY
GLUCOSE-CAPILLARY: 102 mg/dL — AB (ref 70–99)
GLUCOSE-CAPILLARY: 98 mg/dL (ref 70–99)
Glucose-Capillary: 120 mg/dL — ABNORMAL HIGH (ref 70–99)
Glucose-Capillary: 141 mg/dL — ABNORMAL HIGH (ref 70–99)
Glucose-Capillary: 141 mg/dL — ABNORMAL HIGH (ref 70–99)
Glucose-Capillary: 106 mg/dL — ABNORMAL HIGH (ref 70–99)
Glucose-Capillary: 85 mg/dL (ref 70–99)

## 2018-09-03 LAB — COMPREHENSIVE METABOLIC PANEL
ALBUMIN: 2.2 g/dL — AB (ref 3.5–5.0)
ALK PHOS: 108 U/L (ref 38–126)
ALT: 1045 U/L — AB (ref 0–44)
ANION GAP: 6 (ref 5–15)
AST: 93 U/L — ABNORMAL HIGH (ref 15–41)
BUN: 72 mg/dL — ABNORMAL HIGH (ref 6–20)
CALCIUM: 8.2 mg/dL — AB (ref 8.9–10.3)
CO2: 32 mmol/L (ref 22–32)
Chloride: 108 mmol/L (ref 98–111)
Creatinine, Ser: 1.67 mg/dL — ABNORMAL HIGH (ref 0.61–1.24)
GFR calc Af Amer: 58 mL/min — ABNORMAL LOW (ref >60)
GFR calc non Af Amer: 50 mL/min — ABNORMAL LOW (ref >60)
GLUCOSE: 164 mg/dL — AB (ref 70–99)
Potassium: 4.1 mmol/L (ref 3.5–5.1)
Sodium: 146 mmol/L — ABNORMAL HIGH (ref 135–145)
Total Bilirubin: 8.6 mg/dL — ABNORMAL HIGH (ref 0.3–1.2)
Total Protein: 5.6 g/dL — ABNORMAL LOW (ref 6.5–8.1)

## 2018-09-03 LAB — PHOSPHORUS: Phosphorus: 5.1 mg/dL — ABNORMAL HIGH (ref 2.5–4.6)

## 2018-09-03 LAB — COOXEMETRY PANEL
Carboxyhemoglobin: 1.6 % — ABNORMAL HIGH (ref 0.5–1.5)
METHEMOGLOBIN: 0.9 % (ref 0.0–1.5)
O2 Saturation: 72.1 %
Total hemoglobin: 14.7 g/dL (ref 12.0–16.0)

## 2018-09-03 LAB — PROTIME-INR
INR: 1.57
Prothrombin Time: 18.5 seconds — ABNORMAL HIGH (ref 11.4–15.2)

## 2018-09-03 LAB — MAGNESIUM: Magnesium: 2.2 mg/dL (ref 1.7–2.4)

## 2018-09-03 MED ORDER — PRO-STAT SUGAR FREE PO LIQD
60.0000 mL | Freq: Three times a day (TID) | ORAL | Status: DC
  Administered 2018-09-03 – 2018-09-13 (×29): 60 mL
  Filled 2018-09-03 (×30): qty 60

## 2018-09-03 MED ORDER — IBUPROFEN 100 MG/5ML PO SUSP
800.0000 mg | Freq: Three times a day (TID) | ORAL | Status: DC | PRN
  Administered 2018-09-03 – 2018-09-04 (×3): 800 mg via ORAL
  Filled 2018-09-03 (×5): qty 40

## 2018-09-03 MED ORDER — VITAL HIGH PROTEIN PO LIQD
1000.0000 mL | ORAL | Status: DC
  Administered 2018-09-03 – 2018-09-12 (×13): 1000 mL
  Filled 2018-09-03: qty 1000

## 2018-09-03 MED ORDER — ENOXAPARIN SODIUM 40 MG/0.4ML ~~LOC~~ SOLN
40.0000 mg | SUBCUTANEOUS | Status: DC
  Administered 2018-09-03: 40 mg via SUBCUTANEOUS
  Filled 2018-09-03 (×2): qty 0.4

## 2018-09-03 MED ORDER — ADULT MULTIVITAMIN LIQUID CH
15.0000 mL | Freq: Every day | ORAL | Status: DC
  Administered 2018-09-03 – 2018-09-13 (×11): 15 mL
  Filled 2018-09-03 (×11): qty 15

## 2018-09-03 MED ORDER — FENTANYL CITRATE (PF) 100 MCG/2ML IJ SOLN
INTRAMUSCULAR | Status: AC
  Administered 2018-09-03: 09:00:00
  Filled 2018-09-03: qty 2

## 2018-09-03 NOTE — Progress Notes (Addendum)
Patient ID: Cody Patterson, male   DOB: [birth year], 42 y.o.   MRN: [id number]     Advanced Heart Failure Rounding Note  PCP-Cardiologist: No primary care provider on file.   Subjective:    Milrinone decreased to 0.25 and amio decreased to 30 yesterday. Remains off pressors. Lasix stopped overnight with SBP 70s. Now 90-100s. Coox 72%  Remains in NSR.   Tmax 99.5. WBC 20 > 17.9 > 14.9 > 16.6 > 17.6. +H influenzae in sputum. Remains on steroids (tapering down) and meropenum.  Abdominal US strongly suggestive of acute cholecystitis, not surgical candidate. Underwent percutaneous cholecystostomy 10/20. Bili 10.9 > 9.2 > 8.6  INR 1.57. Not on any AC.  Creatinine 3.09 => 2.16 => 1.92 => 1.7 => 1.67. Brisk UOP with lasix drip (stopped overnight with SBP 70s). Weight down 2 lbs in 2 days. CVP 8-9  Remains intubated. Only on precedex for sedation. FiO2 30%. No purposeful movement per RN.   Objective:   Weight Range: 103.9 kg Body mass index is 31.07 kg/m.   Vital Signs:   Temp:  [97.8 F (36.6 C)-99.5 F (37.5 C)] 99.5 F (37.5 C) (10/23 0728) Pulse Rate:  [54-101] 79 (10/23 0728) Resp:  [7-20] 12 (10/23 0728) BP: (73-123)/(48-95) 98/48 (10/23 0728) SpO2:  [90 %-99 %] 95 % (10/23 0728) Arterial Line BP: (89-134)/(58-92) 89/58 (10/22 1800) FiO2 (%):  [30 %] 30 % (10/23 0316) Weight:  [103.9 kg] 103.9 kg (10/23 0448) Last BM Date: 09/08/2018  Weight change: Filed Weights   08/31/18 0500 09/01/18 0440 09/03/18 0448  Weight: 107.3 kg 105.1 kg 103.9 kg    Intake/Output:   Intake/Output Summary (Last 24 hours) at 09/03/2018 0739 Last data filed at 09/03/2018 0630 Gross per 24 hour  Intake 2528.63 ml  Output 3225 ml  Net -696.37 ml      Physical Exam   General: Intubated/sedated.  HEENT: + ETT +OG tube with tube feeds Neck: Supple. JVP 8-9. Carotids 2+ bilat; no bruits. No thyromegaly or nodule noted. Cor: PMI nondisplaced. RRR, No M/G/R noted. Right Hockinson TLC Lungs:  Diminished basilar sounds Abdomen: Soft, non-tender, non-distended, no HSM. No bruits or masses. +BS + RUQ choley drain Extremities: No cyanosis, clubbing, or rash. BLE 1+ edema. Warm. Neuro: Intubated/sedated   Telemetry   NSR 70-80s. Personally reivewed.   EKG    No new tracings.  Labs    CBC Recent Labs    09/02/18 0409 09/03/18 0322  WBC 16.6* 17.9*  NEUTROABS  --  15.0*  HGB 13.8 14.7  HCT 41.3 45.0  MCV 83.6 85.2  PLT 108* 440*   Basic Metabolic Panel Recent Labs    09/02/18 0409 09/03/18 0322  NA 142 146*  K 4.3 4.1  CL 106 108  CO2 28 32  GLUCOSE 149* 164*  BUN 67* 72*  CREATININE 1.70* 1.67*  CALCIUM 8.1* 8.2*  MG 2.0 2.2  PHOS 4.1 5.1*   Liver Function Tests Recent Labs    09/02/18 0409 09/03/18 0322  AST 125* 93*  ALT 1,403* 1,045*  ALKPHOS 108 108  BILITOT 9.2* 8.6*  PROT 5.4* 5.6*  ALBUMIN 2.2* 2.2*   No results for input(s): LIPASE, AMYLASE in the last 72 hours. Cardiac Enzymes No results for input(s): CKTOTAL, CKMB, CKMBINDEX, TROPONINI in the last 72 hours.  BNP: BNP (last 3 results) No results for input(s): BNP in the last 8760 hours.  ProBNP (last 3 results) No results for input(s): PROBNP in the last 8760 hours.   D-Dimer  No results for input(s): DDIMER in the last 72 hours. Hemoglobin A1C No results for input(s): HGBA1C in the last 72 hours. Fasting Lipid Panel No results for input(s): CHOL, HDL, LDLCALC, TRIG, CHOLHDL, LDLDIRECT in the last 72 hours. Thyroid Function Tests No results for input(s): TSH, T4TOTAL, T3FREE, THYROIDAB in the last 72 hours.  Invalid input(s): FREET3  Other results:   Imaging    No results found.   Medications:     Scheduled Medications: . chlorhexidine gluconate (MEDLINE KIT)  15 mL Mouth Rinse BID  . Chlorhexidine Gluconate Cloth  6 each Topical Daily  . enoxaparin (LOVENOX) injection  30 mg Subcutaneous Q24H  . feeding supplement (VITAL HIGH PROTEIN)  1,000 mL Per Tube  Q24H  . folic acid  1 mg Intravenous Daily  . hydrocortisone sod succinate (SOLU-CORTEF) inj  50 mg Intravenous Q12H  . insulin aspart  0-9 Units Subcutaneous Q4H  . mouth rinse  15 mL Mouth Rinse 10 times per day  . sodium chloride flush  10-40 mL Intracatheter Q12H  . sodium chloride flush  5 mL Intracatheter Q8H  . thiamine injection  100 mg Intravenous Daily    Infusions: . sodium chloride Stopped (09/09/2018 2633)  . sodium chloride 10 mL/hr at 09/03/18 0630  . sodium chloride Stopped (09/01/18 1234)  . amiodarone 30 mg/hr (09/03/18 0630)  . dexmedetomidine (PRECEDEX) IV infusion 0.4 mcg/kg/hr (09/03/18 0630)  . famotidine (PEPCID) IV Stopped (09/02/18 0949)  . fentaNYL infusion INTRAVENOUS Stopped (09/02/18 1526)  . meropenem (MERREM) IV 2 g (09/03/18 0641)  . milrinone 0.25 mcg/kg/min (09/03/18 0630)  . norepinephrine (LEVOPHED) Adult infusion Stopped (09/01/18 1314)    PRN Medications: midazolam, sodium chloride flush    Patient Profile   Cody Patterson is a 42 y.o. 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   Presented to [facility] 10/16 with worsening dyspnea. Became hypotensive and with respiratory distress requiring pressor support and intubation. Transferred to [facility] for treatment and further evaluation.   Assessment/Plan   1. Shock: Mixed septic and cardiogenic shock.  SBP dropped to 70s overnight, now up to 90-100s off lasix. Remains off epinephrine, vaso, and norepi.  - H.flu in sputum. - Suspect primarily septic shock. WBC 17.9. 2. Acute on chronic systolic CHF:  Echocardiogram from [facility] 10/16: LV moderately dilated, severely impaired EF at 10%, moderate RV dysfunction, moderately elevated RVSP.  Nonischemic cardiomyopathy pre-existed this hospitalization (had cath at [facility]).  Suspect cardiomyopathy is due to substance abuse (amphetamines).   - Continue milrinone to 0.25. Coox 72%. CVP 8-9, weight still up ~30  lbs. - Lasix drip stopped overnight with SBP 70s. - He is not candidate for VAD or transplant with active substance abuse.  - Add TED hose to help with 3rd spacing.  3. Acute respiratory failure: Remains intubated and sedated. Suspect PNA. No change. P/CCM managing. Sputum + H influenzae 4. Polysubstance abuse: UDS + amphetamines and THC. No change.  5. ID: Septic shock.  Has H influenzae in sputum, suspect PNA.  He also appears to have acute cholecystitis on abdominal US.  - Continue meropenem. Tmax 99.5. WBC trending up. Remains on steroids.   - S/p percutaneous cholecystostomy tube 10/20 (per CCM). See below. 6. Transaminitis: Elevated LFTs.  Suspect ischemic hepatitis with shock + acute cholecystitis. Transaminases trending down but tbili rising, likely in setting of acute cholecystitis. No change.  7. Ileus: KUB 08/26/2018 with large illeus in the setting of stool coming  through OG tube.  - Improved 8. Atrial fibrillation, paroxysmal: Patient went into atrial fibrillation with RVR Saturday night and amio drip was increased. Remains in NSR - INR now 1.56. He is not on [facility]. Will discuss need with MD. (received vitamin K 10/19 and 10/20 for choley drain)  - Continue amiodarone gtt 30 mg/hr.  9. Acute cholecystitis: - s/p perc drain. Leave in for 6-8 weeks and determine if he becomes a surgical candidate for surgery note. No change.   Length of Stay: [place], NP  09/03/2018, 7:39 AM  Advanced Heart Failure Team Pager [phone] (M-F; 7a - 4p)  Please contact [facility] for night-coverage after hours (4p -7a ) and weekends on [email or web address]  Agree with above.  Remains intubated/sedated. On milrinone 0.25. Lasix gtt stopped last night due to hypotension. CVP 9-10 this am. Off precedex for last hour and still not responding. Co-ox 72%. Remains on amio. Rhythm stable. Weight still up about 25 pounds from baseline. WBC climbing on steroids.   Exam  Intubated Not responding.  Skin  feels warm Cor RRR Lungs clear Ab soft NT + perc C-tube Ext 2+ edema   Remains critically ill. Main issue currently is mental status. Continue to hold sedation. May need head CT if not waking up. Stable from HF perspective. Co-ox good. Can drop milrinone to 0.125. He is 3rd spacing fluid but as lungs clear can hold off on diuresis for now. I am worried about possible recurrent sepsis (despite meropenem) with increasing WBC, preload dependence and increasing co-ox. Will reculture. Follow fever curve closely. Continue amio for now. Would not add heparin until mental status improves and we are sure he hasn't bled.   CRITICAL CARE Performed by: Glori Bickers  Total critical care time: 35 minutes  Critical care time was exclusive of separately billable procedures and treating other patients.  Critical care was necessary to treat or prevent imminent or life-threatening deterioration.  Critical care was time spent personally by me (independent of midlevel providers or residents) on the following activities: development of treatment plan with patient and/or surrogate as well as nursing, discussions with consultants, evaluation of patient's response to treatment, examination of patient, obtaining history from patient or surrogate, ordering and performing treatments and interventions, ordering and review of laboratory studies, ordering and review of radiographic studies, pulse oximetry and re-evaluation of patient's condition.  Glori Bickers, MD  9:08 AM    Intubated/sedated

## 2018-09-03 NOTE — Progress Notes (Signed)
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 Presented to [facility] with increasing dyspnea. Acute decompensation with hypotension and severe respiratory distress requiring intubation. Transferred to [facility] for management of sepsis, advanced heart failure.  Growing H influenza in sputum.  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  Past Medical History  Known cardiomyopathy and drug abuse (THC and methamphetamines)  Significant Hospital Events   10/16- Intubated at [facility] 10/17- Worsening shock, maxed on pressors, right heart cath indicative of distributive shock, high output failure 10/18-Started on milrinone, lasix for reduced cardiac output. PA numbers show worsening CO and high wedge 10/19- Started making urine, pressors weaning down, continues on milrinone. Surgery consulted for cholecystitis 10/20- Mucus plug overnight requiring bag, lavage. Went into afib 10/23 Start SBT trials  Consults: date of consult/date signed off & final recs:  10/17- Cardiology  10/19- Surgery signed on 09/02/2017  Procedures (surgical and bedside):  10/16 [facility] R subclavian CVC 10/16 [facility] L femoral arterial line 10/17 PA catheter  Significant Diagnostic Tests:  Echocardiogram 10/17- Mild to moderate LV dilation with EF 20%, diffuse hypokinesis.   Restrictive diastolic function. Mildly dilated RV with mildly   decreased systolic function. No significant valvular abnormalities  Abdominal ultrasound 08/29/2018- severe gallbladder thickening,  sludge, cholelithiasis, small volume of perihepatic, pericholecystic and perinephric free fluid.  UDS Duke Salvia 10/16- Amphetamines, THC  Swan ganz 10/18 Initial numbers RA 10 PA 46/33 (41) PCWP 10 Thermo CO/CI  15.2/7.0 Fick 14.1/6.5 (co-ox 86%) SVR 231  09/02/2018 chest x-ray reviewed shows endotracheal tube central line in good position edema bilaterally  Micro Data:  Bcx 10/17 >> ng Trach aspirate 10/17- H influenza  Antimicrobials:  Vancomycin 10/17 > off Meropenen 10/17 > Aztreonam 10/17>> off  Subjective:  Hypotensive overnight, lasix gtt discontinued. Slightly agitated overnight but calm this AM.  Objective   Blood pressure (!) 98/48, pulse 79, temperature 99.5 F (37.5 C), temperature source Oral, resp. rate 12, height 6' (1.829 m), weight 103.9 kg, SpO2 95 %. CVP:  [9 mmHg-23 mmHg] 9 mmHg CO:  [6.1 L/min] 6.1 L/min  Vent Mode: PRVC FiO2 (%):  [30 %] 30 % Set Rate:  [14 bmp] 14 bmp Vt Set:  [540 mL] 540 mL PEEP:  [5 cmH20] 5 cmH20 Plateau Pressure:  [8 cmH20-16 cmH20] 8 cmH20   Intake/Output Summary (Last 24 hours) at 09/03/2018 0748 Last data filed at 09/03/2018 0630 Gross per 24 hour  Intake 2528.63 ml  Output 3225 ml  Net -696.37 ml   Filed Weights   08/31/18 0500 09/01/18 0440 09/03/18 0448  Weight: 107.3 kg 105.1 kg 103.9 kg   Examination: General: Well-nourished well-developed male, in NAD HEENT: Endotracheal tube in place, gastric tube in place, MMM Neuro: Sedated, does not follow commands CV: RRR, no M/R/G PULM: CTAB PY:KDXI, non-tender, bsx4 active  Extremities: warm/dry, 2+ edema  Skin: no rashes or lesions   Resolved Hospital Problem list     Assessment & Plan:   Shock - Cardiogenic (AoC sCHF with cardiomyopathy and EF 10%), septic combination.  Shock physiology now resolved and off all pressors. Severe cardiomyopathy, noncompliant. V  tach, A. Fib (resolved with amiodarone). - Continue diuresis as tolerated, did not tolerate  gtt - Continue milrinone, amio per cards - Cardiology following, not a candidate for VAD given ongoing substance abuse  Acute respiratory failure secondary to pulmonary edema and H. influenzae pneumonia. - Start weaning trials today (agitation will be a barrier to weaning) - Continue merrem  Acute metabolic encephalopathy-related to sedation + ICU delirium. - Wean precedex to off as able - Lights on during day / off at night  Severe sepsis-H. influenzae pneumonia, +/-  Acute cholecystitis. - Continue merrem, 5 - 7 days total  Renal failure - improving. - Continue to follow urine output and creatinine - Diuresis is able, note positive I/O despite Lasix  Elevated LFTs, INR secondary to shock liver - gradually improving. Coagulopathy - resolved. - Monitor LFTs - Continue with percutaneous drain per IR.   Disposition / Summary of Today's Plan 09/03/18   Wean precedex as able to hopefully facilitate SBT and possible extubation.  Best Practice    Code Status: full Family Communication: 09/02/2018 wife at bedside asleep.  Labs personally reviewed.  CBC: reactive leukocytosis. Recent Labs  Lab 08/30/18 0310 08/31/18 0413 09/01/18 0356 09/02/18 0409 09/03/18 0322  WBC 20.0* 17.9* 14.9* 16.6* 17.9*  NEUTROABS  --   --   --   --  15.0*  HGB 12.6* 12.6* 12.9* 13.8 14.7  HCT 38.5* 38.1* 39.7 41.3 45.0  MCV 84.1 83.2 85.0 83.6 85.2  PLT 160 143* 109* 108* 131*    Basic Metabolic Panel: pending Recent Labs  Lab 08/30/18 0310 08/31/18 0413  09/01/18 0356 09/01/18 1338 09/01/18 2305 09/02/18 0409 09/03/18 0322  NA 133* 136   < > 138 141 141 142 146*  K 4.1 3.6   < > 3.6 3.4* 3.6 4.3 4.1  CL 95* 99   < > 103 104 102 106 108  CO2 24 27   < > 29 29 27 28  32  GLUCOSE 147* 150*   < > 138* 125* 128* 149* 164*  BUN 59* 69*   < > 71* 63* 63* 67* 72*  CREATININE 3.09* 2.16*   < > 1.92* 1.65* 1.65* 1.70* 1.67*  CALCIUM 7.5* 7.7*   < > 7.8* 7.6* 7.9* 8.1* 8.2*  MG 2.2 2.0  --   2.1  --  2.0 2.0 2.2  PHOS 4.6 3.1  --  4.3  --   --  4.1 5.1*   < > = values in this interval not displayed.   GFR: Estimated Creatinine Clearance: 73.3 mL/min (A) (by C-G formula based on SCr of 1.67 mg/dL (H)). Recent Labs  Lab September 20, 2018 0830  09-20-18 1849 08/29/18 0330 08/29/18 0744 08/30/18 0310 08/31/18 0413 09/01/18 0356 09/02/18 0409 09/03/18 0322  PROCALCITON 27.23  --   --  22.85  --  14.68  --   --   --   --   WBC  --   --   --  23.9*  --  20.0* 17.9* 14.9* 16.6* 17.9*  LATICACIDVEN  --    < > 8.7*  --  3.4* 3.1*  --  1.5  --   --    < > = values in this interval not displayed.    Liver Function Tests: Recent Labs  Lab 08/30/18 0310 08/30/18 1054 08/31/18 0413 09/01/18 0356 09/02/18 0409 09/03/18 0322  AST 3,308*  --  848* 267* 125* 93*  ALT 5,164*  --  3,316* 2,214* 1,403* 1,045*  ALKPHOS 127*  --  118 117 108 108  BILITOT 7.7* 7.9* 9.7* 10.8* 9.2* 8.6*  PROT 5.5*  --  5.6* 5.4* 5.4* 5.6*  ALBUMIN 2.6*  --  2.5* 2.4* 2.2* 2.2*   No results for input(s): LIPASE, AMYLASE in the last 168 hours. No results for input(s): AMMONIA in the last 168 hours.  ABG    Component Value Date/Time   PHART 7.440 09/01/2018 0408   PCO2ART 45.9 09/01/2018 0408   PO2ART 130.0 (H) 09/01/2018 0408   HCO3 31.3 (H) 09/01/2018 0408   TCO2 33 (H) 09/01/2018 0408   ACIDBASEDEF 0.8 08/29/2018 0340   O2SAT 72.1 09/03/2018 0330     Coagulation Profile: Recent Labs  Lab 08/30/18 0310 08/31/18 0413 09/01/18 0356 09/02/18 0409 09/03/18 0322  INR 3.40 2.65 2.33 2.10 1.57    Cardiac Enzymes: Recent Labs  Lab 08/29/18 0841 08/30/18 0310  TROPONINI 1.12* 0.65*    HbA1C: No results found for: HGBA1C  CBG: Recent Labs  Lab 09/02/18 1928 09/02/18 2344 09/03/18 0321 09/03/18 0332 09/03/18 0729  GLUCAP 120* 129* 141* 141* 106*    CC time: 30 min.  Rutherford Guys, [place] - [facility] [place] [facility] Pager: [phone]  or [phone] 09/03/2018, 7:58 AM

## 2018-09-03 NOTE — Progress Notes (Addendum)
Pharmacy Antibiotic Note  Cody Patterson is a 42 y.o. male admitted on 09-13-18 with sepsis.  Pharmacy has been consulted for meropenem dosing.  SCr relatively stable. WBC trending back up, also on steroids. Febrile, Tmax 101.7  Repeating blood cultures today.   Plan: Continue meropenem to 2g q 8 hrs. F/u repeat cultures, and length of therapy pending clinical course  Height: 6' (182.9 cm) Weight: 229 lb 0.9 oz (103.9 kg) IBW/kg (Calculated) : 77.6  Temp (24hrs), Avg:99.2 F (37.3 C), Min:97.8 F (36.6 C), Max:101.7 F (38.7 C)  Recent Labs  Lab 08/15/2018 1145  09/09/2018 1849  08/29/18 0744  08/30/18 0310 08/31/18 0413  09/01/18 0356 09/01/18 1338 09/01/18 2305 09/02/18 0409 09/03/18 0322  WBC  --   --   --    < >  --   --  20.0* 17.9*  --  14.9*  --   --  16.6* 17.9*  CREATININE  --    < >  --    < >  --    < > 3.09* 2.16*   < > 1.92* 1.65* 1.65* 1.70* 1.67*  LATICACIDVEN 7.2*  --  8.7*  --  3.4*  --  3.1*  --   --  1.5  --   --   --   --   VANCORANDOM  --   --   --   --   --   --  5  --   --   --   --   --   --   --    < > = values in this interval not displayed.    Estimated Creatinine Clearance: 73.3 mL/min (A) (by C-G formula based on SCr of 1.67 mg/dL (H)).    Allergies  Allergen Reactions  . Amoxicillin Swelling  . Penicillins Swelling   Antimicrobials this admission:  Vanc 10/17 >> 10/21 Mero 10/17 >>   Dose adjustments this admission:  10/19 VR 5> start [id number]  10/20 mero increased 2g q8h, renal function improved   Microbiology results:  10/23 BCx x2:* 10/17 Bcx x 2: ngtd 10/17: Ucx: ngtd 10/17: sputum: mod Hflu, B lactamse + 10/16: mrsa positive   Thank you for allowing pharmacy to be a part of this patient's care.  Marcelino Freestone, PharmD PGY2 Cardiology Pharmacy Resident Phone [phone] Please check AMION for all Pharmacist numbers by unit 09/04/2018 6:26 AM

## 2018-09-03 NOTE — Progress Notes (Signed)
PCCM Interval Progress Note  Fever to 101.67F.  Transamnitis noted; therefore, will avoid tylenol. Ibuprofen ordered PRN fever.   Rutherford Guys, [place] - [facility] [place] [facility] Pager: [phone]  or [phone] 09/03/2018, 10:59 AM

## 2018-09-03 NOTE — Progress Notes (Addendum)
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 Presented to [facility] with increasing dyspnea. Acute decompensation with hypotension and severe respiratory distress requiring intubation. Transferred to [facility].  Patient remains intubated on ventilator support Temp (24hrs), Avg:99.7 F (37.6 C), Min:98.3 F (36.8 C), Max:101.7 F (38.7 C)  Vital High Protein formula currently infusing at 20 ml/hr via OGT. CCM note reviewed. Cardiogenic shock resolved. Sepsis ongoing. Weaning Precedex. Spontaneous breathing trials. Possible extubation.  Labs reviewed. Phos 5.1 (H). Na 146 (H). Mg WNL. Medications include thiamine & folic acid. CBG's [id number].  Spoke with Dr. Marchelle Gearing regarding nutrition care plan.  NUTRITION - FOCUSED PHYSICAL EXAM:  Completed.  No muscle or subcutaneous fat depletion noticed.  Diet Order:   Diet Order            Diet NPO time specified  Diet effective now             EDUCATION NEEDS:   Not appropriate for education at this time  Skin:  Skin Assessment: Reviewed RN Assessment  Last BM:  10/22   Intake/Output Summary (Last 24 hours) at 09/03/2018 1603 Last data filed at 09/03/2018 1200 Gross per 24 hour  Intake 1385.51 ml  Output 2145 ml  Net -759.49 ml   Height:   Ht Readings from Last 1 Encounters:  08/30/18 6' (1.829 m)    Weight:   Wt Readings from Last 1 Encounters:  09/03/18 103.9 kg   Ideal Body Weight:  81 kg  BMI:  Body mass index is 31.07 kg/m.  Estimated Nutritional Needs:   Kcal:  8250-0370  Protein:  >/= 162 gm  Fluid:  per MD  Maureen Chatters, RD, LDN Pager #: [phone] After-Hours Pager #: [phone]

## 2018-09-03 NOTE — Progress Notes (Signed)
eLink Physician-Brief Progress Note Patient Name: Cody Patterson DOB: 04/10/1976 MRN: [id number]   Date of Service  09/03/2018  HPI/Events of Note  hypotensin  eICU Interventions  DC furosemide gtt     Intervention Category Major Interventions: Hypotension - evaluation and management  Merwyn Katos 09/03/2018, 1:14 AM

## 2018-09-04 ENCOUNTER — Inpatient Hospital Stay (HOSPITAL_COMMUNITY): Payer: Medicaid Other

## 2018-09-04 DIAGNOSIS — R791 Abnormal coagulation profile: Secondary | ICD-10-CM

## 2018-09-04 DIAGNOSIS — I6340 Cerebral infarction due to embolism of unspecified cerebral artery: Secondary | ICD-10-CM

## 2018-09-04 LAB — CBC
HCT: 44.8 % (ref 39.0–52.0)
HEMOGLOBIN: 14.7 g/dL (ref 13.0–17.0)
MCH: 27.6 pg (ref 26.0–34.0)
MCHC: 32.8 g/dL (ref 30.0–36.0)
MCV: 84.1 fL (ref 80.0–100.0)
Platelets: 92 10*3/uL — ABNORMAL LOW (ref 150–400)
RBC: 5.33 MIL/uL (ref 4.22–5.81)
RDW: 21.2 % — ABNORMAL HIGH (ref 11.5–15.5)
WBC: 14.6 10*3/uL — ABNORMAL HIGH (ref 4.0–10.5)
nRBC: 0 % (ref 0.0–0.2)

## 2018-09-04 LAB — COOXEMETRY PANEL
Carboxyhemoglobin: 1.6 % — ABNORMAL HIGH (ref 0.5–1.5)
Methemoglobin: 0.9 % (ref 0.0–1.5)
O2 Saturation: 69.8 %
Total hemoglobin: 14.6 g/dL (ref 12.0–16.0)

## 2018-09-04 LAB — POCT I-STAT 3, ART BLOOD GAS (G3+)
Acid-Base Excess: 9 mmol/L — ABNORMAL HIGH (ref 0.0–2.0)
Bicarbonate: 32.3 mmol/L — ABNORMAL HIGH (ref 20.0–28.0)
O2 Saturation: 100 %
Patient temperature: 101
TCO2: 33 mmol/L — ABNORMAL HIGH (ref 22–32)
pCO2 arterial: 41.5 mmHg (ref 32.0–48.0)
pH, Arterial: 7.503 — ABNORMAL HIGH (ref 7.350–7.450)
pO2, Arterial: 266 mmHg — ABNORMAL HIGH (ref 83.0–108.0)

## 2018-09-04 LAB — COMPREHENSIVE METABOLIC PANEL
ALBUMIN: 2 g/dL — AB (ref 3.5–5.0)
ALK PHOS: 88 U/L (ref 38–126)
ALT: 593 U/L — ABNORMAL HIGH (ref 0–44)
AST: 105 U/L — AB (ref 15–41)
Anion gap: 7 (ref 5–15)
BILIRUBIN TOTAL: 11.9 mg/dL — AB (ref 0.3–1.2)
BUN: 81 mg/dL — AB (ref 6–20)
CO2: 30 mmol/L (ref 22–32)
Calcium: 7.8 mg/dL — ABNORMAL LOW (ref 8.9–10.3)
Chloride: 114 mmol/L — ABNORMAL HIGH (ref 98–111)
Creatinine, Ser: 1.83 mg/dL — ABNORMAL HIGH (ref 0.61–1.24)
GFR calc Af Amer: 52 mL/min — ABNORMAL LOW (ref >60)
GFR calc non Af Amer: 45 mL/min — ABNORMAL LOW (ref >60)
GLUCOSE: 124 mg/dL — AB (ref 70–99)
Potassium: 3.7 mmol/L (ref 3.5–5.1)
Sodium: 151 mmol/L — ABNORMAL HIGH (ref 135–145)
TOTAL PROTEIN: 5.2 g/dL — AB (ref 6.5–8.1)

## 2018-09-04 LAB — LACTIC ACID, PLASMA: Lactic Acid, Venous: 1.7 mmol/L (ref 0.5–1.9)

## 2018-09-04 LAB — PROCALCITONIN: Procalcitonin: 2.43 ng/mL

## 2018-09-04 LAB — GLUCOSE, CAPILLARY
GLUCOSE-CAPILLARY: 107 mg/dL — AB (ref 70–99)
GLUCOSE-CAPILLARY: 121 mg/dL — AB (ref 70–99)
GLUCOSE-CAPILLARY: 134 mg/dL — AB (ref 70–99)
Glucose-Capillary: 103 mg/dL — ABNORMAL HIGH (ref 70–99)
Glucose-Capillary: 107 mg/dL — ABNORMAL HIGH (ref 70–99)
Glucose-Capillary: 128 mg/dL — ABNORMAL HIGH (ref 70–99)
Glucose-Capillary: 145 mg/dL — ABNORMAL HIGH (ref 70–99)

## 2018-09-04 LAB — PROTIME-INR
INR: 2.01
Prothrombin Time: 22.5 seconds — ABNORMAL HIGH (ref 11.4–15.2)

## 2018-09-04 LAB — PHOSPHORUS: Phosphorus: 4.2 mg/dL (ref 2.5–4.6)

## 2018-09-04 LAB — MAGNESIUM: Magnesium: 2.5 mg/dL — ABNORMAL HIGH (ref 1.7–2.4)

## 2018-09-04 LAB — AMMONIA: AMMONIA: 75 umol/L — AB (ref 9–35)

## 2018-09-04 MED ORDER — POTASSIUM CHLORIDE 20 MEQ/15ML (10%) PO SOLN
40.0000 meq | Freq: Once | ORAL | Status: AC
  Administered 2018-09-04: 40 meq via ORAL
  Filled 2018-09-04: qty 30

## 2018-09-04 MED ORDER — POTASSIUM CHLORIDE 20 MEQ/15ML (10%) PO SOLN: 40.0000 meq | Freq: Once | ORAL | Status: DC

## 2018-09-04 MED ORDER — HEPARIN SODIUM (PORCINE) 5000 UNIT/ML IJ SOLN
5000.0000 [IU] | Freq: Three times a day (TID) | INTRAMUSCULAR | Status: DC
  Administered 2018-09-04 – 2018-09-13 (×26): 5000 [IU] via SUBCUTANEOUS
  Filled 2018-09-04 (×26): qty 1

## 2018-09-04 MED ORDER — FREE WATER
200.0000 mL | Freq: Three times a day (TID) | Status: DC
  Administered 2018-09-04 (×2): 200 mL

## 2018-09-04 MED ORDER — FUROSEMIDE 10 MG/ML IJ SOLN
40.0000 mg | Freq: Two times a day (BID) | INTRAMUSCULAR | Status: AC
  Administered 2018-09-04 (×2): 40 mg via INTRAVENOUS
  Filled 2018-09-04 (×2): qty 4

## 2018-09-04 MED ORDER — POTASSIUM CHLORIDE 20 MEQ/15ML (10%) PO SOLN
40.0000 meq | Freq: Two times a day (BID) | ORAL | Status: AC
  Administered 2018-09-04 (×2): 40 meq
  Filled 2018-09-04 (×2): qty 30

## 2018-09-04 MED ORDER — VANCOMYCIN HCL 10 G IV SOLR
1750.0000 mg | Freq: Once | INTRAVENOUS | Status: AC
  Administered 2018-09-04: 1750 mg via INTRAVENOUS
  Filled 2018-09-04: qty 1750

## 2018-09-04 MED ORDER — VANCOMYCIN HCL 10 G IV SOLR
1250.0000 mg | INTRAVENOUS | Status: DC
  Administered 2018-09-05 – 2018-09-06 (×2): 1250 mg via INTRAVENOUS
  Filled 2018-09-04 (×3): qty 1250

## 2018-09-04 NOTE — Progress Notes (Signed)
Patient transported to CT and back to 2H03 without any apparent complications.

## 2018-09-04 NOTE — Progress Notes (Signed)
Pharmacy Antibiotic Note  Cody Patterson is a 42 y.o. male admitted on 08/28/2018 with sepsis.  Pharmacy has been consulted for meropenem dosing.  SCr relatively stable, up slightly today. WBC trending back down, also on steroids. Febrile, Tmax 105 early this morning. Vancomycin discontinued 10/21. Given fever despite meropenem therapy, pharmacy has been re-consulted to dose vancomycin.  Last dose of vancomycin 10/21@0600 . Vancomycin has had adequate time to clear.   Plan: Vancomycin 1750 mg IV x1, then vancomycin, 1250 mg IV q24h  Continue meropenem to 2g q 8 hrs. F/u repeat cultures, and length of therapy pending clinical course  Height: 6' (182.9 cm) Weight: 230 lb 13.2 oz (104.7 kg) IBW/kg (Calculated) : 77.6  Temp (24hrs), Avg:101.2 F (38.4 C), Min:98.6 F (37 C), Max:104.2 F (40.1 C)  Recent Labs  Lab 2018/09/19 1145  2018/09/19 1849  08/29/18 0744  08/30/18 0310 08/31/18 0413  09/01/18 0356 09/01/18 1338 09/01/18 2305 09/02/18 0409 09/03/18 0322 09/04/18 0332  WBC  --   --   --    < >  --   --  20.0* 17.9*  --  14.9*  --   --  16.6* 17.9* 14.6*  CREATININE  --    < >  --    < >  --    < > 3.09* 2.16*   < > 1.92* 1.65* 1.65* 1.70* 1.67* 1.83*  LATICACIDVEN 7.2*  --  8.7*  --  3.4*  --  3.1*  --   --  1.5  --   --   --   --   --   VANCORANDOM  --   --   --   --   --   --  5  --   --   --   --   --   --   --   --    < > = values in this interval not displayed.    Estimated Creatinine Clearance: 67.1 mL/min (A) (by C-G formula based on SCr of 1.83 mg/dL (H)).    Allergies  Allergen Reactions  . Amoxicillin Swelling  . Penicillins Swelling   Antimicrobials this admission:  Vanc 10/17 >> 10/21; 10/24 >>  Mero 10/17 >>   Dose adjustments this admission:  10/19 VR 5> start [id number]  10/20 mero increased 2g q8h, renal function improved   Microbiology results:  10/23 BCx x2: NGx1 day 10/17 Bcx x 2: NGTD 10/17: Ucx: NGTD 10/17: sputum: mod Hflu, B lactamse  + 10/16: mrsa positive   Thank you for allowing pharmacy to be a part of this patient's care.  Marcelino Freestone, PharmD PGY2 Cardiology Pharmacy Resident Phone [phone] Please check AMION for all Pharmacist numbers by unit 09/04/2018 7:12 AM

## 2018-09-04 NOTE — Progress Notes (Signed)
Ice packs and cooling blanket was applied at 0130. Rectal temperature is now 105 degrees Farenheit. MD notified. Will continue to monitor for any changes.

## 2018-09-04 NOTE — Progress Notes (Signed)
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 Presented to [facility] with increasing dyspnea. Acute decompensation with hypotension and severe respiratory distress requiring intubation. Transferred to [facility] for management of sepsis, advanced heart failure.  Growing H influenza in sputum.  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  Past Medical History  Known cardiomyopathy and drug abuse (THC and methamphetamines)  Significant Hospital Events   10/16- Intubated at [facility] 10/17- Worsening shock, maxed on pressors, right heart cath indicative of distributive shock, high output failure 10/18-Started on milrinone, lasix for reduced cardiac output. PA numbers show worsening CO and high wedge 10/19- Started making urine, pressors weaning down, continues on milrinone. Surgery consulted for cholecystitis 10/20- Mucus plug overnight requiring bag, lavage. Went into afib.  Perc chole drain placed by IR 10/23 Start SBT trials, failed due to tachypnea  Consults: date of consult/date signed off & final recs:  10/17- Cardiology  10/19- Surgery signed on 09/02/2017  Procedures (surgical and bedside):  10/16 [facility] R subclavian CVC 10/16 [facility] L femoral arterial line 10/17 PA catheter 10/20 Perc chole drain  Significant Diagnostic Tests:  Echocardiogram 10/17- Mild to moderate LV dilation with EF 20%, diffuse hypokinesis.   Restrictive diastolic function. Mildly dilated RV with mildly   decreased systolic function. No significant valvular  abnormalities  Abdominal ultrasound 08/29/2018- severe gallbladder thickening, sludge, cholelithiasis, small volume of perihepatic, pericholecystic and perinephric free fluid.  UDS Duke Salvia 10/16- Amphetamines, THC  Swan ganz 10/18 Initial numbers RA 10 PA 46/33 (41) PCWP 10 Thermo CO/CI  15.2/7.0 Fick 14.1/6.5 (co-ox 86%) SVR 231  09/02/2018 chest x-ray reviewed shows endotracheal tube central line in good position edema bilaterally  Micro Data:  Bcx 10/17 >> ng Trach aspirate 10/17- H influenza  Antimicrobials:  Vancomycin 10/17 > off Meropenen 10/17 > Aztreonam 10/17>> off  Subjective:  Febrile overnight to 105.  Ibuprofen ordered.   Otherwise no acute events.  Objective   Blood pressure 105/74, pulse 73, temperature 98 F (36.7 C), temperature source Core, resp. rate 13, height 6' (1.829 m), weight 104.7 kg, SpO2 100 %. CVP:  [3 mmHg-8 mmHg] 3 mmHg  Vent Mode: PSV;CPAP FiO2 (%):  [40 %-90 %] 40 % Set Rate:  [14 bmp] 14 bmp Vt Set:  [540 mL] 540 mL PEEP:  [5 cmH20] 5 cmH20 Pressure Support:  [12 cmH20] 12 cmH20 Plateau Pressure:  [15 cmH20-17 cmH20] 16 cmH20   Intake/Output Summary (Last 24 hours) at 09/04/2018 0935 Last data filed at 09/04/2018 0900 Gross per 24 hour  Intake 2538.55 ml  Output 3310 ml  Net -771.45 ml   Filed Weights   09/01/18 0440 09/03/18 0448 09/04/18 0500  Weight: 105.1 kg 103.9 kg 104.7 kg   Examination: General: Well-nourished well-developed male, in NAD HEENT: Endotracheal tube in place, gastric tube in place, MMM Neuro: Sedated, does not follow commands CV: RRR, no M/R/G PULM: CTAB OZ:HYQM, non-tender, bsx4 active  Extremities: warm/dry, 2+ edema  Skin: no rashes or lesions   Assessment & Plan:   Shock - Cardiogenic (AoC sCHF with cardiomyopathy and EF 10%), septic combination.  Shock physiology now  resolved and off all pressors. Severe cardiomyopathy, noncompliant. V tach, A. Fib (resolved with amiodarone). - Continue  diuresis as tolerated, did not tolerate gtt.  Will try 40mg  q12hrs x 2 doses today and assess response. - Continue milrinone, amio per cards. - Cardiology following, not a candidate for VAD given ongoing substance abuse.  Acute respiratory failure secondary to pulmonary edema and H. influenzae pneumonia. - Start weaning trials today (agitation will be a barrier to weaning). - Continue merrem.  Severe sepsis-H. influenzae pneumonia, +/-  Acute cholecystitis. - Continue merrem, 5 - 7 days total. - Add vanc back given temp 105. - Recheck PCT, lactate.  Acute metabolic encephalopathy-related to sedation + ICU delirium. - Wean precedex to off as able. - Lights on during day / off at night.  Renal failure - slightly worse. - Continue to follow urine output and creatinine. - Diuresis as able, note positive I/O despite Lasix.  Will try 40mg  q12hrs x 2 doses today and assess response.  Hypernatremia. - Start free water per tube.  Elevated LFTs, INR secondary to shock liver - gradually improving. Coagulopathy - resolved. - Monitor LFTs - Continue with percutaneous drain per IR.  Acute cholecystitis - s/p perc drain 10/20. - Continue drain x 4 - 6 weeks then can determine if surgery will be necessary.  Disposition / Summary of Today's Plan 09/04/18   Add vanc back given Tmax 105.  Check PCT, lactate. Try lasix 40mg  q12hrs x 2 doses and assess response.   Best Practice    Code Status: full Family Communication: 09/04/2018 wife at bedside asleep.  Labs personally reviewed.  CBC: reactive leukocytosis. Recent Labs  Lab 08/31/18 0413 09/01/18 0356 09/02/18 0409 09/03/18 0322 09/04/18 0332  WBC 17.9* 14.9* 16.6* 17.9* 14.6*  NEUTROABS  --   --   --  15.0*  --   HGB 12.6* 12.9* 13.8 14.7 14.7  HCT 38.1* 39.7 41.3 45.0 44.8  MCV 83.2 85.0 83.6 85.2 84.1  PLT 143* 109* 108* 131* 92*    Basic Metabolic Panel: pending Recent Labs  Lab 08/31/18 0413  09/01/18 0356  09/01/18 1338 09/01/18 2305 09/02/18 0409 09/03/18 0322 09/04/18 0332  NA 136   < > 138 141 141 142 146* 151*  K 3.6   < > 3.6 3.4* 3.6 4.3 4.1 3.7  CL 99   < > 103 104 102 106 108 114*  CO2 27   < > 29 29 27 28  32 30  GLUCOSE 150*   < > 138* 125* 128* 149* 164* 124*  BUN 69*   < > 71* 63* 63* 67* 72* 81*  CREATININE 2.16*   < > 1.92* 1.65* 1.65* 1.70* 1.67* 1.83*  CALCIUM 7.7*   < > 7.8* 7.6* 7.9* 8.1* 8.2* 7.8*  MG 2.0  --  2.1  --  2.0 2.0 2.2 2.5*  PHOS 3.1  --  4.3  --   --  4.1 5.1* 4.2   < > = values in this interval not displayed.   GFR: Estimated Creatinine Clearance: 67.1 mL/min (A) (by C-G formula based on SCr of 1.83 mg/dL (H)). Recent Labs  Lab 08/29/18 0330 08/29/18 0744 08/30/18 0310  09/01/18 0356 09/02/18 0409 09/03/18 0322 09/04/18 0332 09/04/18 0736  PROCALCITON 22.85  --  14.68  --   --   --   --   --   --   WBC 23.9*  --  20.0*   < > 14.9* 16.6* 17.9* 14.6*  --  LATICACIDVEN  --  3.4* 3.1*  --  1.5  --   --   --  1.7   < > = values in this interval not displayed.    Liver Function Tests: Recent Labs  Lab 08/31/18 0413 09/01/18 0356 09/02/18 0409 09/03/18 0322 09/04/18 0332  AST 848* 267* 125* 93* 105*  ALT 3,316* 2,214* 1,403* 1,045* 593*  ALKPHOS 118 117 108 108 88  BILITOT 9.7* 10.8* 9.2* 8.6* 11.9*  PROT 5.6* 5.4* 5.4* 5.6* 5.2*  ALBUMIN 2.5* 2.4* 2.2* 2.2* 2.0*   No results for input(s): LIPASE, AMYLASE in the last 168 hours. No results for input(s): AMMONIA in the last 168 hours.  ABG    Component Value Date/Time   PHART 7.503 (H) 09/04/2018 0513   PCO2ART 41.5 09/04/2018 0513   PO2ART 266.0 (H) 09/04/2018 0513   HCO3 32.3 (H) 09/04/2018 0513   TCO2 33 (H) 09/04/2018 0513   ACIDBASEDEF 0.8 08/29/2018 0340   O2SAT 100.0 09/04/2018 0513     Coagulation Profile: Recent Labs  Lab 08/31/18 0413 09/01/18 0356 09/02/18 0409 09/03/18 0322 09/04/18 0332  INR 2.65 2.33 2.10 1.57 2.01    Cardiac Enzymes: Recent Labs  Lab  08/29/18 0841 08/30/18 0310  TROPONINI 1.12* 0.65*    HbA1C: No results found for: HGBA1C  CBG: Recent Labs  Lab 09/03/18 0332 09/03/18 0729 09/03/18 1134 09/03/18 1542 09/03/18 2344  GLUCAP 141* 106* 98 85 102*    CC time: 30 min.  Rutherford Guys, [place] - [facility] Pager: [phone]  or [phone] 09/04/2018, 9:35 AM

## 2018-09-04 NOTE — Progress Notes (Signed)
Referring Physician(s): Dr. Isaiah Serge  Supervising Physician: Simonne Come  Patient Status:  Cody Patterson - In-pt  Chief Complaint: Cholecystitis  Subjective: Remains intubated.   Cholecystostomy remains in place.  Febrile overnight LFTs, Tbili, and INR increasing Lactic acid and WBC stable  Allergies: Amoxicillin and Penicillins  Medications: Prior to Admission medications   Not on File     Vital Signs: BP (!) 137/112   Pulse (!) 103   Temp 98 F (36.7 C) (Core)   Resp (!) 28   Ht 6' (1.829 m)   Wt 230 lb 13.2 oz (104.7 kg)   SpO2 100%   BMI 31.31 kg/m   Physical Exam  NAD, intubated, sedated.  Abdomen: Soft, non-distended.  Cholecystostomy in place.  Insertion site c/d/i.  Dark, bilious output in collection bag.  75 mL recorded overnight.   Imaging: Ir Perc Cholecystostomy  Result Date: 08/31/2018 INDICATION: 42 year old male with a history of acute cholecystitis, sepsis, shock, multi organ failure EXAM: PERCUTANEOUS CHOLECYSTOSTOMY MEDICATIONS: Multiple ICU medications including antibiotics ANESTHESIA/SEDATION: None. FLUOROSCOPY TIME:  Fluoroscopy Time: 0 minutes 18 seconds (3 mGy). COMPLICATIONS: None PROCEDURE: Informed written consent was obtained from the patient's family after a thorough discussion of the procedural risks, benefits and alternatives. All questions were addressed. Maximal Sterile Barrier Technique was utilized including caps, mask, sterile gowns, sterile gloves, sterile drape, hand hygiene and skin antiseptic. A timeout was performed prior to the initiation of the procedure. Ultrasound survey of the right upper quadrant was performed for planning purposes. Once the patient is prepped and draped in the usual sterile fashion, the skin and subcutaneous tissues overlying the gallbladder were generously infiltrated 1% lidocaine for local anesthesia. A coaxial needle was advanced under ultrasound guidance through the skin subcutaneous tissues and a small  segment of liver into the gallbladder lumen. With removal of the stylet, spontaneous dark bile drainage occurred. Using modified Seldinger technique, a 10 French drain was placed into the gallbladder fossa, with aspiration of the sample for the lab. Contrast injection confirmed position of the tube within the gallbladder lumen. Drainage catheter was attached to gravity drain with a suture retention placed. Patient tolerated the procedure well and remained hemodynamically stable throughout. No complications were encountered and no significant blood loss encountered. IMPRESSION: Status post image guided percutaneous cholecystostomy. Signed, Yvone Neu. Reyne Dumas, RPVI [facility] Radiology Electronically Signed   By: Gilmer Mor D.O.   On: 08/31/2018 11:22   Dg Chest Port 1 View  Result Date: 09/04/2018 CLINICAL DATA:  Respiratory failure. EXAM: PORTABLE CHEST 1 VIEW COMPARISON:  Radiograph of September 03, 2018. FINDINGS: Stable position of endotracheal and nasogastric tubes. Stable position of right subclavian catheter. Stable position of right internal jugular venous sheath. No pneumothorax is noted. Stable bibasilar subsegmental atelectasis is noted, right greater than left. No significant pleural effusion is noted. Bony thorax is unremarkable. IMPRESSION: Stable support apparatus. Stable bibasilar subsegmental atelectasis. Electronically Signed   By: Lupita Raider, M.D.   On: 09/04/2018 10:33   Dg Chest Port 1 View  Result Date: 09/03/2018 CLINICAL DATA:  Acute respiratory failure.  Follow-up exam. EXAM: PORTABLE CHEST 1 VIEW COMPARISON:  09/02/2018 and older exams FINDINGS: Endotracheal tube tip projects 3.8 cm above the Carina, without significant change from prior study allowing for differences in patient positioning. Right internal jugular introducer sheath and right subclavian central venous line are stable from previous day's study. New  nasal/orogastric tube passes below the diaphragm, well into the stomach  and below the included field of view. Stable cardiac enlargement. Mild basilar lung opacities consistent with atelectasis. No convincing pneumonia. No pulmonary edema. No pneumothorax. IMPRESSION: 1. No convincing pneumonia. No pulmonary edema. Mild basilar atelectasis similar to previous day's study allowing for the differences in patient positioning. 2. New nasal/orogastric tube is well positioned. Remaining support apparatus is stable and well positioned. Electronically Signed   By: Amie Portland M.D.   On: 09/03/2018 10:54   Dg Chest Port 1 View  Result Date: 09/02/2018 CLINICAL DATA:  Endotracheal tube check EXAM: PORTABLE CHEST 1 VIEW COMPARISON:  09/01/2018 FINDINGS: Endotracheal tube is 5.7 cm from carina retraction of Swan-Ganz catheter. Central venous line and sheath remain. No pneumothorax. No pulmonary edema. IMPRESSION: 1. Endotracheal tube in good position. 2. Retraction of Swan-Ganz catheter. 3. No edema or pneumothorax. Electronically Signed   By: Genevive Bi M.D.   On: 09/02/2018 10:14   Dg Chest Port 1 View  Result Date: 09/01/2018 CLINICAL DATA:  Hypoxia EXAM: PORTABLE CHEST 1 VIEW COMPARISON:  August 31, 2018 FINDINGS: Endotracheal tube tip is 4.5 cm above the carina. Central catheter tip is in the main pulmonary outflow tract. Right subclavian catheter tip is in the superior vena cava near the cavoatrial junction. Nasogastric tube tip and side port are below the diaphragm. No pneumothorax. There is a nipple shadow on the left. There is no edema or consolidation. Heart is borderline enlarged with pulmonary vascularity normal. No adenopathy. No bone lesions. IMPRESSION: Tube and catheter positions as described without pneumothorax. No edema or consolidation. Mild cardiac prominence. Electronically Signed   By: Bretta Bang III M.D.   On: 09/01/2018 08:02    Labs:  CBC: Recent Labs    09/01/18 0356  09/02/18 0409 09/03/18 0322 09/04/18 0332  WBC 14.9* 16.6* 17.9* 14.6*  HGB 12.9* 13.8 14.7 14.7  HCT 39.7 41.3 45.0 44.8  PLT 109* 108* 131* 92*    COAGS: Recent Labs    09-18-18 1851  09/01/18 0356 09/02/18 0409 09/03/18 0322 09/04/18 0332  INR 2.58   < > 2.33 2.10 1.57 2.01  APTT 70*  --   --   --   --   --    < > = values in this interval not displayed.    BMP: Recent Labs    09/01/18 2305 09/02/18 0409 09/03/18 0322 09/04/18 0332  NA 141 142 146* 151*  K 3.6 4.3 4.1 3.7  CL 102 106 108 114*  CO2 27 28 32 30  GLUCOSE 128* 149* 164* 124*  BUN 63* 67* 72* 81*  CALCIUM 7.9* 8.1* 8.2* 7.8*  CREATININE 1.65* 1.70* 1.67* 1.83*  GFRNONAA 50* 49* 50* 45*  GFRAA 59* 56* 58* 52*    LIVER FUNCTION TESTS: Recent Labs    09/01/18 0356 09/02/18 0409 09/03/18 0322 09/04/18 0332  BILITOT 10.8* 9.2* 8.6* 11.9*  AST 267* 125* 93* 105*  ALT 2,214* 1,403* 1,045* 593*  ALKPHOS 117 108 108 88  PROT 5.4* 5.4* 5.6* 5.2*  ALBUMIN 2.4* 2.2* 2.2* 2.0*    Assessment and Plan: Cholecystitis s/p percutaneous cholecystostomy tube placement by Dr. Loreta Ave 10/20 Remains intubated, sedated. T bili increasing (now 11.9) Dark bilious output.  Cultures pending but still with NGTD.  Blood cultures also with NGTD Unexplained fevers. Continue current management.  IR to follow.   Electronically Signed: Hoyt Koch, PA 09/04/2018, 10:44 AM   I spent a total of 15 Minutes at the the patient's bedside AND on the patient's hospital  floor or unit, greater than 50% of which was counseling/coordinating care for cholecystitis.

## 2018-09-04 NOTE — Consult Note (Signed)
Stroke Neurology Consultation Note  Consult Requested by: Dr. Marchelle Gearing   Reason for Consult: possible stroke  Consult Date: 09/04/18  The history was obtained from the bedside RN and chart.    History of Present Illness:  Cody Patterson is a 42 y.o. Caucasian male with PMH of CHF, cardiomyopathy with EF 10-20%, polysubstance abuse, paroxysmal A. fib not on [facility] admitted for shortness of breath.  Found to have bacteremia, fever, sepsis and respiratory failure.  He was intubated on sedation, continued to have fever and leukocytosis.  H influenza positive in sputum as well as concerning for pneumonia, currently on meropenem and vancomycin, and tapered off steroid.  Also has abnormal LFT with elevated INR and bilirubin.  Abdominal ultrasound suggestive of acute cholecystitis, status post percutaneous cholecystectomy 08/31/2018.  Bile culture and blood culture pending but NGTD.  2D echo showed EF 20%, cardiology on board, on Lasix and milrinone, not a candidate for LVAD due to drug abuse.  UDS showed positive for THC and amphetamine.  Patient also had A. fib RVR and V. tach, resolved with amiodarone IV drip.  Currently sinus rhythm.  This morning, patient was off Precedex for weaning trial.  Initially, patient seems tolerating well, but no spontaneous movement or purposeful activity. Round 1 pm, patient gradually to be agitated, keeps shaking head, turning head from side-to-side, started to have heavy breathing, tachypnea go to 40s with abdominal breathing pattern, SBP up to 150s and tachycardia 130-150.  He received Versed and put back on Precedex, abnormal breathing continued.  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  Neurology was called for consultation.  After 4 mg of Versed IV and resumption of Precedex, patient finally relax and calm down with SBP down to 60 to 70s.  Currently not responding to voice or stimulation during my round.  No seizure  activity described by RN, no shaking jerking.   Past Medical History:  Diagnosis Date  . Cardiomyopathy (HCC)   . Heart failure (HCC)   . Polysubstance abuse (HCC)    THC and Amphetamines    No family history on file.  Social History:  reports that he has current or past drug history. Drugs: Methamphetamines and Marijuana. His tobacco and alcohol histories are not on file.  Allergies:  Allergies  Allergen Reactions  . Amoxicillin Swelling  . Penicillins Swelling     Review of Systems: A full ROS was attempted today and was not able to be performed given AMS.   Physical Examination: Temp:  [98 F (36.7 C)-104.2 F (40.1 C)] 99.2 F (37.3 C) (10/24 1700) Pulse Rate:  [37-143] 37 (10/24 1630) Resp:  [0-41] 21 (10/24 1731) BP: (68-154)/(27-131) 76/50 (10/24 1731) SpO2:  [90 %-100 %] 91 % (10/24 1630) FiO2 (%):  [30 %-60 %] 40 % (10/24 1524) Weight:  [104.7 kg] 104.7 kg (10/24 0500)  General - well nourished, well developed, intubated on precedex, not responsive.    Ophthalmologic - fundi not visualized due to noncooperation.    Cardiovascular - regular rate and rhythm.   Neuro - intubated on Precedex, nonresponsive, eyes closed, not open on voice or pain stimulation.  Pupil 4 mm bilaterally, reactive to light but slow, right eye mid position, left eye tongue and out position, disconjugate, dorsi present but sluggish.  Left corneal reflex absent, right corneal reflex weakly present, gag and cough absent.  On pain stimulation, no movement on all extremities.  DTR diminished and no Babinski. Sensation,  coordination and gait not tested.   Data Reviewed:  Ct Head Wo Contrast  Result Date: 09/04/2018 CLINICAL DATA:  42 y/o  M; decreased responsiveness, encephalopathy. EXAM: CT HEAD WITHOUT CONTRAST TECHNIQUE: Contiguous axial images were obtained from the base of the skull through the vertex without intravenous contrast. COMPARISON:  10/13/2015 CT head. FINDINGS: Brain: Small  area of hypoattenuation with focal loss of gray-white differentiation in the right parietal lobe. No associated hemorrhage or mass effect. No extra-axial collection, hydrocephalus, or herniation. Vascular: Calcific atherosclerosis of carotid siphons. No hyperdense vessel identified. Skull: Normal. Negative for fracture or focal lesion. Sinuses/Orbits: No acute finding. Other: None. IMPRESSION: Small area of hypoattenuation with focal loss of gray-white differentiation in the right parietal lobe, suspected recent acute to subacute infarction. No associated hemorrhage or mass effect. These results will be called to the ordering clinician or representative by the Radiologist Assistant, and communication documented in the PACS or zVision Dashboard. Electronically Signed   By: Mitzi Hansen M.D.   On: 09/04/2018 15:18     Assessment: 42 y.o. male with PMH of CHF, cardiomyopathy with EF 10-20%, polysubstance abuse, paroxysmal A. fib not on [facility] admitted for shortness of breath.  Found to have bacteremia, fever, sepsis, respiratory failure requiring intubation and antibiotics, abnormal LFT with elevated INR and bilirubin, acute cholecystitis s/p percutaneous cholecystectomy, THC and amphetamine abuse, A. fib RVR and V. Tach resolved with amiodarone IV drip.   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  Neurology was called for consultation.    Will do MRI and MRA and carotid Doppler to further evaluate stroke, and to rule out brainstem stroke.  Patient had multiple stroke risk factors for embolic stroke including A. fib RVR, cardiomyopathy, and sepsis.  We also order EEG to rule out seizure and to evaluate for prognostication.  Recommend to avoid low BP for cerebral perfusion.  Given multiorgan failure, patient poor prognosis, recommend to  initiate palliative care discussion.  Plan: - MRI, MRA  of the brain without contrast once BP stabilized - Carotid dopplers - EEG to rule out seizure and for prognostication - Avoid low BP, BP goal 110-130 - Poor prognosis, recommend to initiate palliative care discussion - will follow.  This patient is critically ill due to sepsis, cardiomyopathy, A. fib RVR, V. tach, liver failure, respiratory failure, stroke and at significant risk of neurological worsening, death form recurrent stroke, hemorrhagic conversion, heart failure, septic shock, seizure. This patient's care requires constant monitoring of vital signs, hemodynamics, respiratory and cardiac monitoring, review of multiple databases, neurological assessment, discussion with family, other specialists and medical decision making of high complexity. I spent 40 minutes of neurocritical care time in the care of this patient.  Marvel Plan, MD PhD Stroke Neurology 09/04/2018 6:09 PM

## 2018-09-04 NOTE — Progress Notes (Signed)
   Got call from radiology of results below Called Dr Roda Shutters of stroke service - he will see     SIGNATURE    Dr. Kalman Shan, M.D., F.C.C.P,  Pulmonary and Critical Care Medicine Staff Physician, [facility] Center Director - Interstitial Lung Disease  Program  Pulmonary Fibrosis [facility] at [facility] [place], [place], [address]  Pager: [phone], If no answer or between  15:00h - 7:00h: call [phone] Telephone: [phone]  5:35 PM 09/04/2018    Ct Head Wo Contrast  Result Date: 09/04/2018 CLINICAL DATA:  42 y/o  M; decreased responsiveness, encephalopathy. EXAM: CT HEAD WITHOUT CONTRAST TECHNIQUE: Contiguous axial images were obtained from the base of the skull through the vertex without intravenous contrast. COMPARISON:  10/13/2015 CT head. FINDINGS: Brain: Small area of hypoattenuation with focal loss of gray-white differentiation in the right parietal lobe. No associated hemorrhage or mass effect. No extra-axial collection, hydrocephalus, or herniation. Vascular: Calcific atherosclerosis of carotid siphons. No hyperdense vessel identified. Skull: Normal. Negative for fracture or focal lesion. Sinuses/Orbits: No acute finding. Other: None. IMPRESSION: Small area of hypoattenuation with focal loss of gray-white differentiation in the right parietal lobe, suspected recent acute to subacute infarction. No associated hemorrhage or mass effect. These results will be called to the ordering clinician or representative by the Radiologist Assistant, and communication documented in the PACS or zVision Dashboard. Electronically Signed   By: Mitzi Hansen M.D.   On: 09/04/2018 15:18   Dg Chest Port 1 View  Result Date: 09/04/2018 CLINICAL DATA:  Respiratory failure. EXAM: PORTABLE CHEST 1 VIEW COMPARISON:  Radiograph of September 03, 2018. FINDINGS: Stable position of endotracheal and nasogastric tubes. Stable position of right subclavian  catheter. Stable position of right internal jugular venous sheath. No pneumothorax is noted. Stable bibasilar subsegmental atelectasis is noted, right greater than left. No significant pleural effusion is noted. Bony thorax is unremarkable. IMPRESSION: Stable support apparatus. Stable bibasilar subsegmental atelectasis. Electronically Signed   By: Lupita Raider, M.D.   On: 09/04/2018 10:33

## 2018-09-04 NOTE — Plan of Care (Signed)

## 2018-09-04 NOTE — Progress Notes (Addendum)
Patient ID: Cody Patterson, male   DOB: June 06, 1976, 42 y.o.   MRN: [id number]     Advanced Heart Failure Rounding Note  PCP-Cardiologist: No primary care provider on file.   Subjective:    Milrinone decreased to 0.125 mcg/kg/min yesterday. Coox 70%. SBP now 100-110s.  Remains in NSR on amio drip 30 mg/hr.   Febrile up to 105 overnight. WBC 20 > 17.9 > 14.9 > 16.6 > 17.6 > 14.6. +H influenzae in sputum. Tapering down steroids. Remains on meropenum. Blood cultures redrawn 10/23 pending.   Abdominal US strongly suggestive of acute cholecystitis, not surgical candidate. Underwent percutaneous cholecystostomy 10/20. Bili back up 10.9 > 9.2 > 8.6 > 11.9  INR back up to 2.01. Not on any AC.   Creatinine 3.09 => 2.16 => 1.92 => 1.7 => 1.67 => 1.81. Modest UOP off lasix. Weight up 1 lb. CVP 4  Remains intubated. Only on precedex for sedation. FiO2 40%. No purposeful movement per RN.  ABG: pH 7.50, pCO2 41.5, pO2 266, Bicarb 32. (FiO2 turned down from 60 to 40%).  Objective:   Weight Range: 104.7 kg Body mass index is 31.31 kg/m.   Vital Signs:   Temp:  [98.6 F (37 C)-104.2 F (40.1 C)] 101 F (38.3 C) (10/24 0400) Pulse Rate:  [71-136] 78 (10/24 0600) Resp:  [12-39] 21 (10/24 0600) BP: (89-172)/(48-136) 130/97 (10/24 0600) SpO2:  [87 %-100 %] 100 % (10/24 0600) FiO2 (%):  [30 %-90 %] 40 % (10/24 0547) Weight:  [104.7 kg] 104.7 kg (10/24 0500) Last BM Date: 08/13/2018  Weight change: Filed Weights   09/01/18 0440 09/03/18 0448 09/04/18 0500  Weight: 105.1 kg 103.9 kg 104.7 kg    Intake/Output:   Intake/Output Summary (Last 24 hours) at 09/04/2018 0702 Last data filed at 09/04/2018 0600 Gross per 24 hour  Intake 2414.26 ml  Output 2975 ml  Net -560.74 ml      Physical Exam   General: Intubated/sedated.  HEENT: + ETT +OG tube. No corneal reflexes.  Neck: Supple. JVP ~8. Carotids 2+ bilat; no bruits. No thyromegaly or nodule noted. Cor: PMI nondisplaced. RRR, No  M/G/R noted. Right [place] TLC.  Lungs: coarse throughout. Abdomen: Soft, non-tender, non-distended, no HSM. No bruits or masses. +BS +RUQ choley drain Extremities: No cyanosis, clubbing, or rash. Trace ankle edema.  Neuro: Intubated/sedated. No movement to nailbed pressure.   Telemetry   NSR 80-90s. Personally reivewed.   EKG    No new tracings.  Labs    CBC Recent Labs    09/03/18 0322 09/04/18 0332  WBC 17.9* 14.6*  NEUTROABS 15.0*  --   HGB 14.7 14.7  HCT 45.0 44.8  MCV 85.2 84.1  PLT 131* 92*   Basic Metabolic Panel Recent Labs    09/03/18 0322 09/04/18 0332  NA 146* 151*  K 4.1 3.7  CL 108 114*  CO2 32 30  GLUCOSE 164* 124*  BUN 72* 81*  CREATININE 1.67* 1.83*  CALCIUM 8.2* 7.8*  MG 2.2 2.5*  PHOS 5.1* 4.2   Liver Function Tests Recent Labs    09/03/18 0322 09/04/18 0332  AST 93* 105*  ALT 1,045* 593*  ALKPHOS 108 88  BILITOT 8.6* 11.9*  PROT 5.6* 5.2*  ALBUMIN 2.2* 2.0*   No results for input(s): LIPASE, AMYLASE in the last 72 hours. Cardiac Enzymes No results for input(s): CKTOTAL, CKMB, CKMBINDEX, TROPONINI in the last 72 hours.  BNP: BNP (last 3 results) No results for input(s): BNP in the last 8760  hours.  ProBNP (last 3 results) No results for input(s): PROBNP in the last 8760 hours.   D-Dimer No results for input(s): DDIMER in the last 72 hours. Hemoglobin A1C No results for input(s): HGBA1C in the last 72 hours. Fasting Lipid Panel No results for input(s): CHOL, HDL, LDLCALC, TRIG, CHOLHDL, LDLDIRECT in the last 72 hours. Thyroid Function Tests No results for input(s): TSH, T4TOTAL, T3FREE, THYROIDAB in the last 72 hours.  Invalid input(s): FREET3  Other results:   Imaging    No results found.   Medications:     Scheduled Medications: . chlorhexidine gluconate (MEDLINE KIT)  15 mL Mouth Rinse BID  . Chlorhexidine Gluconate Cloth  6 each Topical Daily  . enoxaparin (LOVENOX) injection  40 mg Subcutaneous Q24H  .  feeding supplement (PRO-STAT SUGAR FREE 64)  60 mL Per Tube TID  . feeding supplement (VITAL HIGH PROTEIN)  1,000 mL Per Tube Q24H  . folic acid  1 mg Intravenous Daily  . insulin aspart  0-9 Units Subcutaneous Q4H  . mouth rinse  15 mL Mouth Rinse 10 times per day  . multivitamin  15 mL Per Tube Daily  . sodium chloride flush  10-40 mL Intracatheter Q12H  . sodium chloride flush  5 mL Intracatheter Q8H  . thiamine injection  100 mg Intravenous Daily    Infusions: . sodium chloride Stopped (08/29/2018 1062)  . sodium chloride Stopped (09/01/18 1234)  . amiodarone 30 mg/hr (09/04/18 0600)  . dexmedetomidine (PRECEDEX) IV infusion 1 mcg/kg/hr (09/04/18 0600)  . famotidine (PEPCID) IV 20 mg (09/03/18 1038)  . meropenem (MERREM) IV 200 mL/hr at 09/04/18 0600  . milrinone 0.125 mcg/kg/min (09/04/18 0600)  . norepinephrine (LEVOPHED) Adult infusion Stopped (09/01/18 1314)    PRN Medications: ibuprofen, midazolam, sodium chloride flush    Patient Profile   Cody Patterson is a 42 y.o. 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   Presented to [facility] 10/16 with worsening dyspnea. Became hypotensive and with respiratory distress requiring pressor support and intubation. Transferred to [facility] for treatment and further evaluation.   Assessment/Plan   1. Shock: Mixed septic and cardiogenic shock.  SBP 100-110s. Remains off epinephrine, vaso, and norepi.  - H.flu in sputum. Repeat blood cultures pending. - Suspect primarily septic shock. WBC trending down, but febrile to 105 overnight. 2. Acute on chronic systolic CHF:  Echocardiogram from [facility] 10/16: LV moderately dilated, severely impaired EF at 10%, moderate RV dysfunction, moderately elevated RVSP.  Nonischemic cardiomyopathy pre-existed this hospitalization (had cath at [facility]).  Suspect cardiomyopathy is due to substance abuse (amphetamines).   - Continue milrinone to 0.125. Coox 70%. CVP 4,  weight still up ~30 lbs. Consider stopping milrinone.  - Lasix held yesterday. - He is not candidate for VAD or transplant with active substance abuse.  - Add TED hose to help with 3rd spacing.  3. Acute respiratory failure: Remains intubated and sedated. Suspect PNA. P/CCM managing. Sputum + H influenzae 4. Polysubstance abuse: UDS + amphetamines and THC. No change.  5. ID: Septic shock.  Has H influenzae in sputum, suspect PNA.  He also appears to have acute cholecystitis on abdominal US.  - Continue meropenem. Tmax 105 overnight. WBC trending down. Remains on steroids. Consider adding Vanc back. Check lactate today.  - S/p percutaneous cholecystostomy tube 10/20 (per CCM). Had 75 mls out yeterday.. 6. Transaminitis: Elevated LFTs.  Suspect ischemic hepatitis with shock + acute cholecystitis. Transaminases trending down  but tbili rising again, likely in setting of acute cholecystitis.  7. Ileus: KUB 08/25/2018 with large illeus in the setting of stool coming through OG tube.  - Improved 8. Atrial fibrillation, paroxysmal: Patient went into atrial fibrillation with RVR Saturday night and amio drip was increased. Remains in NSR - INR 2.01. He is not on [facility]. (received vitamin K 10/19 and 10/20 for choley drain)  - Continue amiodarone gtt 30 mg/hr.  9. Acute cholecystitis: - s/p perc drain. Leave in for 6-8 weeks and determine if he becomes a surgical candidate for surgery note. Put out 75 mls yesterday.   Length of Stay: [place], NP  09/04/2018, 7:02 AM  Advanced Heart Failure Team Pager [phone] (M-F; 7a - 4p)  Please contact [facility] for night-coverage after hours (4p -7a ) and weekends on [email or web address]  Agree.  Remains critically ill. Not waking up. Continues with high-grade temps despite meropenem. Repeat cultures drawn yesterday. Remain negative. Will add vanc. Appears to be back in AFL/SVT or may be sinus tach with agitation. Remains on milrinone at 0.125. Co-ox 73%.    Intubated non-responsive +scleral icterus JVP flat Cor tachy regular Lungs clear Ab soft NT/nd Ext + edema   He remains critically ill with high-grade temps and encephalopathy - concern for anoxic injury/bleed. Will get stat CT. Add vanc. May need EEG. Can stop milrinone. Lasix on hold despite volume up with 3rd spacing. Check ammonia. Prognosis increasingly grim. Can consider TEE as needed but no vegetation seen on TTE.   CRITICAL CARE Performed by: Glori Bickers  Total critical care time: 35 minutes  Critical care time was exclusive of separately billable procedures and treating other patients.  Critical care was necessary to treat or prevent imminent or life-threatening deterioration.  Critical care was time spent personally by me (independent of midlevel providers or residents) on the following activities: development of treatment plan with patient and/or surrogate as well as nursing, discussions with consultants, evaluation of patient's response to treatment, examination of patient, obtaining history from patient or surrogate, ordering and performing treatments and interventions, ordering and review of laboratory studies, ordering and review of radiographic studies, pulse oximetry and re-evaluation of patient's condition.   Glori Bickers, MD  2:20 PM

## 2018-09-04 NOTE — Progress Notes (Addendum)
RN began to decrease sedation at beginning of shift, sedation completely off at 1000 am. Non purposeful movements from patient at this time. Pt weaning on vent from 0730 until 1200 with PS on 12. Pt tolerating well. Pt begin to become tachycardic HR 140s, RR in the 30-40s, sats dec to mid 80s. Pt looking very distress. Pt moving his head side to side with no purpose, no eye opening, pt not responding to verbal commands. RN suctioned, called RT to place pt back on full support, restarted precedex on 1.35mcg/hr and administered 2mg  versed. PT did not respond. HR 150s, RR 50s, and sats mid 80s. RN paged CCM MD, CCM ordered STAT head CT. PT administered another 2 mg of versed post CT scan. Pt HR now 83, NSR, RR 21, Sats 100% on 50% FIO2 and BP 78/50 (60). RN will continue to monitor.

## 2018-09-04 NOTE — Progress Notes (Signed)
   S: RN paged me and reported tachypnea, and cheyne stoke style repiration  p - get ct head.     SIGNATURE    Dr. Kalman Shan, M.D., F.C.C.P,  Pulmonary and Critical Care Medicine Staff Physician, [facility] Center Director - Interstitial Lung Disease  Program  Pulmonary Fibrosis [facility] [place], [place], [address]  Pager: [phone], If no answer or between  15:00h - 7:00h: call [phone] Telephone: [phone]  1:19 PM 09/04/2018

## 2018-09-04 NOTE — Progress Notes (Signed)
Transported pt to and from CT without issue.

## 2018-09-04 NOTE — Progress Notes (Signed)
Pt placed back on full support at this time due to increased WOB & RR >40

## 2018-09-05 ENCOUNTER — Inpatient Hospital Stay (HOSPITAL_COMMUNITY): Payer: Medicaid Other

## 2018-09-05 DIAGNOSIS — I6521 Occlusion and stenosis of right carotid artery: Secondary | ICD-10-CM

## 2018-09-05 DIAGNOSIS — I63411 Cerebral infarction due to embolism of right middle cerebral artery: Secondary | ICD-10-CM

## 2018-09-05 DIAGNOSIS — R4189 Other symptoms and signs involving cognitive functions and awareness: Secondary | ICD-10-CM

## 2018-09-05 DIAGNOSIS — N179 Acute kidney failure, unspecified: Secondary | ICD-10-CM

## 2018-09-05 DIAGNOSIS — F1510 Other stimulant abuse, uncomplicated: Secondary | ICD-10-CM

## 2018-09-05 DIAGNOSIS — I428 Other cardiomyopathies: Secondary | ICD-10-CM

## 2018-09-05 LAB — LIPID PANEL
CHOLESTEROL: 125 mg/dL (ref 0–200)
Triglycerides: 208 mg/dL — ABNORMAL HIGH (ref <150)
VLDL: 42 mg/dL — AB (ref 0–40)

## 2018-09-05 LAB — CBC
HEMATOCRIT: 45.8 % (ref 39.0–52.0)
HEMOGLOBIN: 15.2 g/dL (ref 13.0–17.0)
MCH: 28.6 pg (ref 26.0–34.0)
MCHC: 33.2 g/dL (ref 30.0–36.0)
MCV: 86.3 fL (ref 80.0–100.0)
Platelets: 121 10*3/uL — ABNORMAL LOW (ref 150–400)
RBC: 5.31 MIL/uL (ref 4.22–5.81)
RDW: 22.2 % — AB (ref 11.5–15.5)
WBC: 18 10*3/uL — AB (ref 4.0–10.5)
nRBC: 0.1 % (ref 0.0–0.2)

## 2018-09-05 LAB — COMPREHENSIVE METABOLIC PANEL
ALK PHOS: 83 U/L (ref 38–126)
ALT: 393 U/L — AB (ref 0–44)
AST: 100 U/L — AB (ref 15–41)
Albumin: 1.9 g/dL — ABNORMAL LOW (ref 3.5–5.0)
Anion gap: 5 (ref 5–15)
BUN: 87 mg/dL — ABNORMAL HIGH (ref 6–20)
CALCIUM: 7.8 mg/dL — AB (ref 8.9–10.3)
CHLORIDE: 117 mmol/L — AB (ref 98–111)
CO2: 32 mmol/L (ref 22–32)
CREATININE: 1.58 mg/dL — AB (ref 0.61–1.24)
GFR, EST NON AFRICAN AMERICAN: 53 mL/min — AB (ref >60)
Glucose, Bld: 126 mg/dL — ABNORMAL HIGH (ref 70–99)
Potassium: 4.3 mmol/L (ref 3.5–5.1)
Sodium: 154 mmol/L — ABNORMAL HIGH (ref 135–145)
Total Bilirubin: 14.3 mg/dL — ABNORMAL HIGH (ref 0.3–1.2)
Total Protein: 5.2 g/dL — ABNORMAL LOW (ref 6.5–8.1)

## 2018-09-05 LAB — POCT I-STAT 3, ART BLOOD GAS (G3+)
Acid-Base Excess: 7 mmol/L — ABNORMAL HIGH (ref 0.0–2.0)
BICARBONATE: 30.8 mmol/L — AB (ref 20.0–28.0)
O2 Saturation: 100 %
PH ART: 7.478 — AB (ref 7.350–7.450)
PO2 ART: 160 mmHg — AB (ref 83.0–108.0)
TCO2: 32 mmol/L (ref 22–32)
pCO2 arterial: 41.6 mmHg (ref 32.0–48.0)

## 2018-09-05 LAB — AEROBIC/ANAEROBIC CULTURE (SURGICAL/DEEP WOUND)

## 2018-09-05 LAB — COOXEMETRY PANEL
Carboxyhemoglobin: 1.9 % — ABNORMAL HIGH (ref 0.5–1.5)
Methemoglobin: 1.4 % (ref 0.0–1.5)
O2 SAT: 59.3 %
TOTAL HEMOGLOBIN: 16.1 g/dL — AB (ref 12.0–16.0)

## 2018-09-05 LAB — BILIRUBIN, FRACTIONATED(TOT/DIR/INDIR)
BILIRUBIN INDIRECT: 4.5 mg/dL — AB (ref 0.3–0.9)
Bilirubin, Direct: 7.3 mg/dL — ABNORMAL HIGH (ref 0.0–0.2)
Total Bilirubin: 11.8 mg/dL — ABNORMAL HIGH (ref 0.3–1.2)

## 2018-09-05 LAB — HEMOGLOBIN A1C
HEMOGLOBIN A1C: 6.2 % — AB (ref 4.8–5.6)
MEAN PLASMA GLUCOSE: 131.24 mg/dL

## 2018-09-05 LAB — GLUCOSE, CAPILLARY
GLUCOSE-CAPILLARY: 90 mg/dL (ref 70–99)
GLUCOSE-CAPILLARY: 74 mg/dL (ref 70–99)
Glucose-Capillary: 107 mg/dL — ABNORMAL HIGH (ref 70–99)
Glucose-Capillary: 91 mg/dL (ref 70–99)
Glucose-Capillary: 104 mg/dL — ABNORMAL HIGH (ref 70–99)

## 2018-09-05 LAB — PROCALCITONIN: PROCALCITONIN: 4.82 ng/mL

## 2018-09-05 LAB — AEROBIC/ANAEROBIC CULTURE W GRAM STAIN (SURGICAL/DEEP WOUND)

## 2018-09-05 LAB — PROTIME-INR
INR: 1.87
Prothrombin Time: 21.3 seconds — ABNORMAL HIGH (ref 11.4–15.2)

## 2018-09-05 LAB — MAGNESIUM: MAGNESIUM: 2.4 mg/dL (ref 1.7–2.4)

## 2018-09-05 MED ORDER — FENTANYL 2500MCG IN NS 250ML (10MCG/ML) PREMIX INFUSION
0.0000 ug/h | INTRAVENOUS | Status: DC
  Administered 2018-09-05: 50 ug/h via INTRAVENOUS
  Administered 2018-09-06: 300 ug/h via INTRAVENOUS
  Administered 2018-09-06: 350 ug/h via INTRAVENOUS
  Administered 2018-09-06: 300 ug/h via INTRAVENOUS
  Administered 2018-09-07: 400 ug/h via INTRAVENOUS
  Administered 2018-09-07: 300 ug/h via INTRAVENOUS
  Administered 2018-09-07 – 2018-09-08 (×4): 400 ug/h via INTRAVENOUS
  Administered 2018-09-08: 5 ug/h via INTRAVENOUS
  Filled 2018-09-05 (×11): qty 250

## 2018-09-05 MED ORDER — IBUPROFEN 100 MG/5ML PO SUSP
400.0000 mg | Freq: Once | ORAL | Status: AC
  Administered 2018-09-05: 400 mg
  Filled 2018-09-05: qty 20

## 2018-09-05 MED ORDER — FREE WATER
400.0000 mL | Freq: Three times a day (TID) | Status: DC
  Administered 2018-09-05 – 2018-09-08 (×7): 400 mL

## 2018-09-05 MED ORDER — FENTANYL CITRATE (PF) 100 MCG/2ML IJ SOLN
100.0000 ug | INTRAMUSCULAR | Status: DC | PRN
  Administered 2018-09-10: 100 ug via INTRAVENOUS
  Filled 2018-09-05 (×3): qty 2

## 2018-09-05 MED ORDER — FENTANYL CITRATE (PF) 100 MCG/2ML IJ SOLN
100.0000 ug | INTRAMUSCULAR | Status: DC | PRN
  Administered 2018-09-10 (×2): 100 ug via INTRAVENOUS
  Filled 2018-09-05 (×3): qty 2

## 2018-09-05 NOTE — Procedures (Signed)
Date of recording10/25/2019  Referring physician Marvel Plan  Reason for the study Unresponsiveness, intubated  Technical Digital EEG recording using 10-20 international electrode system  Description of the recording Posterior dominant EEG is about 5 to 6 Hz intermittently seen without any EEG reactivity. Almost continuous generalized delta slowing Epileptiform features were not seen during this recording  Impression The EEG isabnormal and findings are consistent with moderate to severe generalized cerebral dysfunction. Epileptiform features were not seen during this recording

## 2018-09-05 NOTE — Progress Notes (Signed)
Patient with prolonged QTc, confirmed with EKG. Discussed with Dr Shirlee Latch, no changes with medications at this time.

## 2018-09-05 NOTE — Progress Notes (Signed)
Palliative Medicine consult noted. Due to high referral volume, there may be a delay seeing this patient. Please call the Palliative Medicine Team office at [phone] if recommendations are needed in the interim.  Thank you for inviting [place] to see this patient.  Margret Chance Nicholous Girgenti, RN, BSN, [facility] Palliative Medicine Team 09/05/2018 3:28 PM Office [phone]

## 2018-09-05 NOTE — Progress Notes (Signed)
*  PRELIMINARY RESULTS* Vascular Ultrasound Carotid Duplex (Doppler) has been completed.  Limited evaluation of right carotid artery system due to central line placement.  There is evidence of a thumped right ICA waveform, suggestive of possible distal ICA occlusion.  Left internal carotid artery evaluation is suggestive of 1-39% stenosis. Left vertebral artery is patent with antegrade flow.  Preliminary results discussed with Dr. Roda Shutters.  09/05/2018 4:23 PM Gertie Fey, MHA, RVT, RDCS, RDMS

## 2018-09-05 NOTE — Progress Notes (Addendum)
STROKE TEAM PROGRESS NOTE   SUBJECTIVE (INTERVAL HISTORY) His girlfriend and RN are at the bedside. Pt still intubated on precedex. Not responsive. BP on the low side. Still on levophed. Will titrating up. Had MRI and MRA showed right MCA small infarct but right ICA occlusion vs. High grade stenosis. CUS pending.    OBJECTIVE Temp:  [98.2 F (36.8 C)-100.7 F (38.2 C)] 98.8 F (37.1 C) (10/25 0945) Pulse Rate:  [37-143] 78 (10/25 0900) Cardiac Rhythm: Normal sinus rhythm (10/25 0800) Resp:  [0-41] 15 (10/25 0945) BP: (68-154)/(27-131) 117/89 (10/25 0945) SpO2:  [90 %-100 %] 100 % (10/25 0945) FiO2 (%):  [30 %-40 %] 40 % (10/25 0800) Weight:  [102.6 kg] 102.6 kg (10/25 0500)  Recent Labs  Lab 09/04/18 1557 09/04/18 1957 09/04/18 2339 09/05/18 0301 09/05/18 0742  GLUCAP 107* 134* 128* 107* 91   Recent Labs  Lab 08/31/18 0413  09/01/18 0356  09/01/18 2305 09/02/18 0409 09/03/18 0322 09/04/18 0332 09/05/18 0227  NA 136   < > 138   < > 141 142 146* 151* 154*  K 3.6   < > 3.6   < > 3.6 4.3 4.1 3.7 4.3  CL 99   < > 103   < > 102 106 108 114* 117*  CO2 27   < > 29   < > 27 28 32 30 32  GLUCOSE 150*   < > 138*   < > 128* 149* 164* 124* 126*  BUN 69*   < > 71*   < > 63* 67* 72* 81* 87*  CREATININE 2.16*   < > 1.92*   < > 1.65* 1.70* 1.67* 1.83* 1.58*  CALCIUM 7.7*   < > 7.8*   < > 7.9* 8.1* 8.2* 7.8* 7.8*  MG 2.0  --  2.1  --  2.0 2.0 2.2 2.5*  --   PHOS 3.1  --  4.3  --   --  4.1 5.1* 4.2  --    < > = values in this interval not displayed.   Recent Labs  Lab 09/01/18 0356 09/02/18 0409 09/03/18 0322 09/04/18 0332 09/05/18 0227  AST 267* 125* 93* 105* 100*  ALT 2,214* 1,403* 1,045* 593* 393*  ALKPHOS 117 108 108 88 83  BILITOT 10.8* 9.2* 8.6* 11.9* 14.3*  PROT 5.4* 5.4* 5.6* 5.2* 5.2*  ALBUMIN 2.4* 2.2* 2.2* 2.0* 1.9*   Recent Labs  Lab 09/01/18 0356 09/02/18 0409 09/03/18 0322 09/04/18 0332 09/05/18 0227  WBC 14.9* 16.6* 17.9* 14.6* 18.0*  NEUTROABS  --    --  15.0*  --   --   HGB 12.9* 13.8 14.7 14.7 15.2  HCT 39.7 41.3 45.0 44.8 45.8  MCV 85.0 83.6 85.2 84.1 86.3  PLT 109* 108* 131* 92* 121*   Recent Labs  Lab 08/30/18 0310  TROPONINI 0.65*   Recent Labs    09/03/18 0322 09/04/18 0332 09/05/18 0227  LABPROT 18.5* 22.5* 21.3*  INR 1.57 2.01 1.87   No results for input(s): COLORURINE, LABSPEC, PHURINE, GLUCOSEU, HGBUR, BILIRUBINUR, KETONESUR, PROTEINUR, UROBILINOGEN, NITRITE, LEUKOCYTESUR in the last 72 hours.  Invalid input(s): APPERANCEUR     Component Value Date/Time   CHOL 125 09/05/2018 0227   TRIG 208 (H) 09/05/2018 0227   HDL <10 (L) 09/05/2018 0227   CHOLHDL NOT CALCULATED 09/05/2018 0227   VLDL 42 (H) 09/05/2018 0227   LDLCALC NOT CALCULATED 09/05/2018 0227   Lab Results  Component Value Date   HGBA1C 6.2 (H) 09/05/2018  No results found for: LABOPIA, COCAINSCRNUR, LABBENZ, AMPHETMU, THCU, LABBARB  No results for input(s): ETH in the last 168 hours.  I have personally reviewed the radiological images below and agree with the radiology interpretations.  Dg Abd 1 View  Result Date: September 08, 2018 CLINICAL DATA:  Abdominal distention. EXAM: ABDOMEN - 1 VIEW COMPARISON:  None. FINDINGS: NG tube is in the mid stomach. Moderate gas and stool within the colon. No evidence of bowel obstruction. No organomegaly or visible free air. IMPRESSION: NG tube tip in the mid stomach. Moderate gas and stool within the colon. Electronically Signed   By: Charlett Nose M.D.   On: 09-08-2018 08:48   Ct Head Wo Contrast  Result Date: 09/04/2018 CLINICAL DATA:  42 y/o  M; decreased responsiveness, encephalopathy. EXAM: CT HEAD WITHOUT CONTRAST TECHNIQUE: Contiguous axial images were obtained from the base of the skull through the vertex without intravenous contrast. COMPARISON:  10/13/2015 CT head. FINDINGS: Brain: Small area of hypoattenuation with focal loss of gray-white differentiation in the right parietal lobe. No associated  hemorrhage or mass effect. No extra-axial collection, hydrocephalus, or herniation. Vascular: Calcific atherosclerosis of carotid siphons. No hyperdense vessel identified. Skull: Normal. Negative for fracture or focal lesion. Sinuses/Orbits: No acute finding. Other: None. IMPRESSION: Small area of hypoattenuation with focal loss of gray-white differentiation in the right parietal lobe, suspected recent acute to subacute infarction. No associated hemorrhage or mass effect. These results will be called to the ordering clinician or representative by the Radiologist Assistant, and communication documented in the PACS or zVision Dashboard. Electronically Signed   By: Mitzi Hansen M.D.   On: 09/04/2018 15:18   Mr Maxine Glenn Head Wo Contrast  Result Date: 09/05/2018 CLINICAL DATA:  Altered mental status, follow-up infarct. History of polysubstance abuse, cardiomyopathy. EXAM: MRI HEAD WITHOUT CONTRAST MRA HEAD WITHOUT CONTRAST TECHNIQUE: Multiplanar, multiecho pulse sequences of the brain and surrounding structures were obtained without intravenous contrast. Angiographic images of the head were obtained using MRA technique without contrast. COMPARISON:  CT HEAD September 04, 2018 FINDINGS: MRI HEAD FINDINGS INTRACRANIAL CONTENTS: Patchy reduced diffusion RIGHT parietal lobe, with subcentimeter foci reduced diffusion RIGHT frontal and temporal lobes, areas of diffusion abnormality demonstrate faintly decreased ADC values. No susceptibility artifact to suggest hemorrhage or septic emboli. A few scattered subcentimeter supratentorial white matter FLAIR T2 hyperintensities exclusive aforementioned abnormality. No midline shift, mass effect or masses. No abnormal extra-axial fluid collections. Borderline parenchymal brain volume loss for age. No hydrocephalus. VASCULAR: T2 bright signal RIGHT ICA. SKULL AND UPPER CERVICAL SPINE: No abnormal sellar expansion. No suspicious calvarial bone marrow signal. Craniocervical  junction maintained. SINUSES/ORBITS: Bilateral mastoid effusions. Paranasal sinuses are well aerated. Secretions layering in the nasopharynx. Included ocular globes and orbital contents are non-suspicious. OTHER: Patient is edentulous.  Life support lines in place. MRA HEAD FINDINGS ANTERIOR CIRCULATION: Loss of RIGHT cervical internal carotid artery flow related enhancement, thready within RIGHT petrous to supraclinoid segments. Patent LEFT internal carotid artery. Patent anterior communicating artery and anterior cerebral arteries. Patent though slow flow RIGHT middle cerebral artery. Normal LEFT MCA. No flow limiting stenosis, aneurysm. POSTERIOR CIRCULATION: Codominant vertebral arteries. Vertebrobasilar arteries are patent, with normal flow related enhancement of the main branch vessels. Patent posterior cerebral arteries. No large vessel occlusion, flow limiting stenosis,  aneurysm. ANATOMIC VARIANTS: None. Source images and MIP images were reviewed. IMPRESSION: MRI HEAD: 1. 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 2. Borderline parenchymal brain volume loss for age. 3. Minimal chronic small vessel  ischemic changes. MRA HEAD: 1. 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 Recommend CTA head and neck. These results will be called to the ordering clinician or representative by the Radiologist Assistant, and communication documented in the PACS or zVision Dashboard. Electronically Signed   By: Awilda Metro M.D.   On: 09/05/2018 06:30   Mr Brain Wo Contrast  Result Date: 09/05/2018 CLINICAL DATA:  Altered mental status, follow-up infarct. History of polysubstance abuse, cardiomyopathy. EXAM: MRI HEAD WITHOUT CONTRAST MRA HEAD WITHOUT CONTRAST TECHNIQUE: Multiplanar, multiecho pulse sequences of the brain and surrounding structures were obtained without intravenous contrast. Angiographic images  of the head were obtained using MRA technique without contrast. COMPARISON:  CT HEAD September 04, 2018 FINDINGS: MRI HEAD FINDINGS INTRACRANIAL CONTENTS: Patchy reduced diffusion RIGHT parietal lobe, with subcentimeter foci reduced diffusion RIGHT frontal and temporal lobes, areas of diffusion abnormality demonstrate faintly decreased ADC values. No susceptibility artifact to suggest hemorrhage or septic emboli. A few scattered subcentimeter supratentorial white matter FLAIR T2 hyperintensities exclusive aforementioned abnormality. No midline shift, mass effect or masses. No abnormal extra-axial fluid collections. Borderline parenchymal brain volume loss for age. No hydrocephalus. VASCULAR: T2 bright signal RIGHT ICA. SKULL AND UPPER CERVICAL SPINE: No abnormal sellar expansion. No suspicious calvarial bone marrow signal. Craniocervical junction maintained. SINUSES/ORBITS: Bilateral mastoid effusions. Paranasal sinuses are well aerated. Secretions layering in the nasopharynx. Included ocular globes and orbital contents are non-suspicious. OTHER: Patient is edentulous.  Life support lines in place. MRA HEAD FINDINGS ANTERIOR CIRCULATION: Loss of RIGHT cervical internal carotid artery flow related enhancement, thready within RIGHT petrous to supraclinoid segments. Patent LEFT internal carotid artery. Patent anterior communicating artery and anterior cerebral arteries. Patent though slow flow RIGHT middle cerebral artery. Normal LEFT MCA. No flow limiting stenosis, aneurysm. POSTERIOR CIRCULATION: Codominant vertebral arteries. Vertebrobasilar arteries are patent, with normal flow related enhancement of the main branch vessels. Patent posterior cerebral arteries. No large vessel occlusion, flow limiting stenosis,  aneurysm. ANATOMIC VARIANTS: None. Source images and MIP images were reviewed. IMPRESSION: MRI HEAD: 1. 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 2. Borderline parenchymal brain  volume loss for age. 3. Minimal chronic small vessel ischemic changes. MRA HEAD: 1. 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 Recommend CTA head and neck. These results will be called to the ordering clinician or representative by the Radiologist Assistant, and communication documented in the PACS or zVision Dashboard. Electronically Signed   By: Awilda Metro M.D.   On: 09/05/2018 06:30   Carotid Doppler  pending  TTE  - Mild to moderate LV dilation with EF 20%, diffuse hypokinesis.   Restrictive diastolic function. Mildly dilated RV with mildly   decreased systolic function. No significant valvular   abnormalities.  LE venous doppler Right: There is no evidence of deep vein thrombosis in the lower extremity. However, portions of this examination were limited- see technologist comments above. No cystic structure found in the popliteal fossa. Left: There is no evidence of deep vein thrombosis in the lower extremity. However, portions of this examination were limited- see technologist comments above. No cystic structure found in the popliteal fossa.   PHYSICAL EXAM  Temp:  [98.2 F (36.8 C)-100.7 F (38.2 C)] 98.8 F (37.1 C) (10/25 0945) Pulse Rate:  [37-143] 78 (10/25 0900) Resp:  [0-41] 15 (10/25 0945) BP: (68-154)/(27-131) 117/89 (10/25 0945) SpO2:  [90 %-100 %] 100 % (  10/25 0945) FiO2 (%):  [30 %-40 %] 40 % (10/25 0800) Weight:  [102.6 kg] 102.6 kg (10/25 0500)  General - well nourished, well developed, intubated on precedex, not responsive.    Ophthalmologic - fundi not visualized due to noncooperation.    Cardiovascular - regular rate and rhythm.   Neuro - intubated on Precedex, nonresponsive, eyes closed, not open on voice or pain stimulation. Sclera jaundice.  Pupil 2 mm bilaterally, sluggish to ligh, both eyes midposition, doll's eyes present but slow.  Left corneal reflex absent,  right corneal reflex weakly present, gag and cough present today.  On pain stimulation, no movement on all extremities.  DTR diminished and no Babinski. Sensation, coordination and gait not tested.   ASSESSMENT/PLAN Mr. Nakota Elsen is a 42 y.o. male with history of CHF, cardiomyopathy with EF 10-20%, polysubstance abuse, paroxysmal A. fib not on [facility] admitted for shortness of breath.  Found to have bacteremia, fever, sepsis, respiratory failure requiring intubation and antibiotics, abnormal LFT with elevated INR and bilirubin, acute cholecystitis s/p percutaneous cholecystectomy, THC and amphetamine abuse, A. fib RVR and V. Tach resolved with amiodarone IV drip.09/04/18 had episode of agitation, tachypnea, tachycardia, hypertension, and abnormal breathing pattern during ventilation weaning, stat CT showed right parietal subacute small to moderate sized infarct.   Stroke:  right MCA small cortical infarcts, embolic pattern, source not certain, however, combination of many potential causes likely including A. fib RVR not on AC (but INR high), cardiomyopathy with low EF, and septic emboli   Resultant coma  CT head right parietal small subacute infarct  MRI  Right MCA cortical small infarcts  MRA  Right ICA occlusion vs. High grade stenosis  Carotid Doppler  pending  2D Echo  EF 20%  LE venous doppler no DVT  UDS pending  LDL NTC due to low HDL  HgbA1c 6.2  SCDs for VTE prophylaxis  No antithrombotic prior to admission, now on No antithrombotic given high INR. Consider anticoagulation once INR < 1.5  Ongoing aggressive stroke risk factor management  Therapy recommendations:  pending  Disposition:  pending  Right ICA occlusion vs high grade stenosis  MRA suggest proximal ICA occlusion with distal recon but slow flow  CUS pending  BP goal 120-140  Not candidate for intervention at this time  Once Cre normalized, may consider CTA head and neck  Sepsis and septic  shock  Blood culture NGTD  US abdomen suggestive of cholecystitis  S/p percutaneous drain - bile culture NTGD  Leukocytosis WBC 14.6->18.0  BP on the low side  On levophed  Cardiomyopathy   Cardiogenic shock  Cardiology on board  EF 20%  Not on AC due to high INR  Liver failure  Sclera jaundice  Total bilirubin 11.9->14.3  AST ALT elevated  INR 2.1->1.57->2.01->1.87  AKI  On IVF  Cre 1.83->1.58  May consider CTA head and neck once Cre normalized  Hypotension . On the low side . On levophed  BP goal 120-140 given right ICA occlusion vs. High grade stenosis  Other Stroke Risk Factors  Obesity, Body mass index is 30.68 kg/m.   Substance abuse - amphetamine and THC positive on UDS  Other Active Problems  hypernatremia  Hospital day # 9  This patient is critically ill due to stroke, sepsis, septic shock, cardiogenic shock, cardiomyopathy, liver failure and at significant risk of neurological worsening, death form recurrent stroke, sepsis, heart failure, cardiac arrest, cirrhosis. This patient's care requires constant monitoring of vital signs, hemodynamics, respiratory and  cardiac monitoring, review of multiple databases, neurological assessment, discussion with family, other specialists and medical decision making of high complexity. I spent 40 minutes of neurocritical care time in the care of this patient. I discussed with Dr. Marchelle Gearing on the phone.   Marvel Plan, MD PhD Stroke Neurology 09/05/2018 9:59 AM    To contact Stroke Continuity provider, please refer to [email or web address]. After hours, contact General Neurology

## 2018-09-05 NOTE — Progress Notes (Addendum)
Transferred Pt to/from MRI w/out complications. Notified MD (Neurology) regarding critical   MRI results-

## 2018-09-05 NOTE — Progress Notes (Signed)
Referring Physician(s): Dr Marchelle Gearing  Supervising Physician: Simonne Come  Patient Status:  [facility] - In-pt  Chief Complaint:  Chole drain placed in IR 10/20  Subjective:  OP 110 yesterday (~~100 cc daily) Fever 102-103 Bili rising On vent   Allergies: Amoxicillin and Penicillins  Medications: Prior to Admission medications   Not on File     Vital Signs: BP 121/88   Pulse 80   Temp (!) 102.2 F (39 C)   Resp 16   Ht 6' (1.829 m)   Wt 222 lb 0.1 oz (100.7 kg)   SpO2 100%   BMI 30.11 kg/m   Physical Exam  Skin: Skin is warm and dry.  Site of GB drain is clean and dry No bleeding' OP bile ~~ 100 cc daily Flushes easily-- no malfunction NG on Cx  Vitals reviewed.   Imaging: Ct Head Wo Contrast  Result Date: 09/04/2018 CLINICAL DATA:  42 y/o  M; decreased responsiveness, encephalopathy. EXAM: CT HEAD WITHOUT CONTRAST TECHNIQUE: Contiguous axial images were obtained from the base of the skull through the vertex without intravenous contrast. COMPARISON:  10/13/2015 CT head. FINDINGS: Brain: Small area of hypoattenuation with focal loss of gray-white differentiation in the right parietal lobe. No associated hemorrhage or mass effect. No extra-axial collection, hydrocephalus, or herniation. Vascular: Calcific atherosclerosis of carotid siphons. No hyperdense vessel identified. Skull: Normal. Negative for fracture or focal lesion. Sinuses/Orbits: No acute finding. Other: None. IMPRESSION: Small area of hypoattenuation with focal loss of gray-white differentiation in the right parietal lobe, suspected recent acute to subacute infarction. No associated hemorrhage or mass effect. These results will be called to the ordering clinician or representative by the Radiologist Assistant, and communication documented in the PACS or zVision Dashboard. Electronically Signed   By: Mitzi Hansen M.D.   On: 09/04/2018 15:18   Mr Maxine Glenn Head Wo Contrast  Result Date:  09/05/2018 CLINICAL DATA:  Altered mental status, follow-up infarct. History of polysubstance abuse, cardiomyopathy. EXAM: MRI HEAD WITHOUT CONTRAST MRA HEAD WITHOUT CONTRAST TECHNIQUE: Multiplanar, multiecho pulse sequences of the brain and surrounding structures were obtained without intravenous contrast. Angiographic images of the head were obtained using MRA technique without contrast. COMPARISON:  CT HEAD September 04, 2018 FINDINGS: MRI HEAD FINDINGS INTRACRANIAL CONTENTS: Patchy reduced diffusion RIGHT parietal lobe, with subcentimeter foci reduced diffusion RIGHT frontal and temporal lobes, areas of diffusion abnormality demonstrate faintly decreased ADC values. No susceptibility artifact to suggest hemorrhage or septic emboli. A few scattered subcentimeter supratentorial white matter FLAIR T2 hyperintensities exclusive aforementioned abnormality. No midline shift, mass effect or masses. No abnormal extra-axial fluid collections. Borderline parenchymal brain volume loss for age. No hydrocephalus. VASCULAR: T2 bright signal RIGHT ICA. SKULL AND UPPER CERVICAL SPINE: No abnormal sellar expansion. No suspicious calvarial bone marrow signal. Craniocervical junction maintained. SINUSES/ORBITS: Bilateral mastoid effusions. Paranasal sinuses are well aerated. Secretions layering in the nasopharynx. Included ocular globes and orbital contents are non-suspicious. OTHER: Patient is edentulous.  Life support lines in place. MRA HEAD FINDINGS ANTERIOR CIRCULATION: Loss of RIGHT cervical internal carotid artery flow related enhancement, thready within RIGHT petrous to supraclinoid segments. Patent LEFT internal carotid artery. Patent anterior communicating artery and anterior cerebral arteries. Patent though slow flow RIGHT middle cerebral artery. Normal LEFT MCA. No flow limiting stenosis, aneurysm. POSTERIOR CIRCULATION: Codominant vertebral arteries. Vertebrobasilar arteries are patent, with normal flow related  enhancement of the main branch vessels. Patent posterior cerebral arteries. No large vessel occlusion, flow limiting stenosis,  aneurysm. ANATOMIC VARIANTS: None.  Source images and MIP images were reviewed. IMPRESSION: MRI HEAD: 1. 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 2. Borderline parenchymal brain volume loss for age. 3. Minimal chronic small vessel ischemic changes. MRA HEAD: 1. 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 Recommend CTA head and neck. These results will be called to the ordering clinician or representative by the Radiologist Assistant, and communication documented in the PACS or zVision Dashboard. Electronically Signed   By: Awilda Metro M.D.   On: 09/05/2018 06:30   Mr Brain Wo Contrast  Result Date: 09/05/2018 CLINICAL DATA:  Altered mental status, follow-up infarct. History of polysubstance abuse, cardiomyopathy. EXAM: MRI HEAD WITHOUT CONTRAST MRA HEAD WITHOUT CONTRAST TECHNIQUE: Multiplanar, multiecho pulse sequences of the brain and surrounding structures were obtained without intravenous contrast. Angiographic images of the head were obtained using MRA technique without contrast. COMPARISON:  CT HEAD September 04, 2018 FINDINGS: MRI HEAD FINDINGS INTRACRANIAL CONTENTS: Patchy reduced diffusion RIGHT parietal lobe, with subcentimeter foci reduced diffusion RIGHT frontal and temporal lobes, areas of diffusion abnormality demonstrate faintly decreased ADC values. No susceptibility artifact to suggest hemorrhage or septic emboli. A few scattered subcentimeter supratentorial white matter FLAIR T2 hyperintensities exclusive aforementioned abnormality. No midline shift, mass effect or masses. No abnormal extra-axial fluid collections. Borderline parenchymal brain volume loss for age. No hydrocephalus. VASCULAR: T2 bright signal RIGHT ICA. SKULL AND UPPER CERVICAL SPINE: No  abnormal sellar expansion. No suspicious calvarial bone marrow signal. Craniocervical junction maintained. SINUSES/ORBITS: Bilateral mastoid effusions. Paranasal sinuses are well aerated. Secretions layering in the nasopharynx. Included ocular globes and orbital contents are non-suspicious. OTHER: Patient is edentulous.  Life support lines in place. MRA HEAD FINDINGS ANTERIOR CIRCULATION: Loss of RIGHT cervical internal carotid artery flow related enhancement, thready within RIGHT petrous to supraclinoid segments. Patent LEFT internal carotid artery. Patent anterior communicating artery and anterior cerebral arteries. Patent though slow flow RIGHT middle cerebral artery. Normal LEFT MCA. No flow limiting stenosis, aneurysm. POSTERIOR CIRCULATION: Codominant vertebral arteries. Vertebrobasilar arteries are patent, with normal flow related enhancement of the main branch vessels. Patent posterior cerebral arteries. No large vessel occlusion, flow limiting stenosis,  aneurysm. ANATOMIC VARIANTS: None. Source images and MIP images were reviewed. IMPRESSION: MRI HEAD: 1. 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 2. Borderline parenchymal brain volume loss for age. 3. Minimal chronic small vessel ischemic changes. MRA HEAD: 1. 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 Recommend CTA head and neck. These results will be called to the ordering clinician or representative by the Radiologist Assistant, and communication documented in the PACS or zVision Dashboard. Electronically Signed   By: Awilda Metro M.D.   On: 09/05/2018 06:30   Dg Chest Port 1 View  Result Date: 09/05/2018 CLINICAL DATA:  42 year old male with a history of endotracheal tube placement EXAM: PORTABLE CHEST 1 VIEW COMPARISON:  09/04/2018 FINDINGS: Cardiomediastinal silhouette unchanged. Improved aeration compared to the prior plain film  with persistently low lung volumes. No pneumothorax. No large pleural effusion. Similar appearance of coarsened interstitial markings. Endotracheal tube terminates suitably above the carina approximately 5 cm. Unchanged right subclavian central catheter terminating in the region of the superior cavoatrial junction. Unchanged right IJ sheath. Unchanged gastric tube terminating out of the field of view. IMPRESSION: Improved aeration at the comparison plain film, with evidence of mild edema and/or atelectasis. Endotracheal tube terminates suitably above the  carina. Unchanged right subclavian central catheter, right IJ sheath, and gastric tube Electronically Signed   By: Gilmer Mor D.O.   On: 09/05/2018 11:00   Dg Chest Port 1 View  Result Date: 09/04/2018 CLINICAL DATA:  Respiratory failure. EXAM: PORTABLE CHEST 1 VIEW COMPARISON:  Radiograph of September 03, 2018. FINDINGS: Stable position of endotracheal and nasogastric tubes. Stable position of right subclavian catheter. Stable position of right internal jugular venous sheath. No pneumothorax is noted. Stable bibasilar subsegmental atelectasis is noted, right greater than left. No significant pleural effusion is noted. Bony thorax is unremarkable. IMPRESSION: Stable support apparatus. Stable bibasilar subsegmental atelectasis. Electronically Signed   By: Lupita Raider, M.D.   On: 09/04/2018 10:33   Dg Chest Port 1 View  Result Date: 09/03/2018 CLINICAL DATA:  Acute respiratory failure.  Follow-up exam. EXAM: PORTABLE CHEST 1 VIEW COMPARISON:  09/02/2018 and older exams FINDINGS: Endotracheal tube tip projects 3.8 cm above the Carina, without significant change from prior study allowing for differences in patient positioning. Right internal jugular introducer sheath and right subclavian central venous line are stable from previous day's study. New nasal/orogastric tube passes below the diaphragm, well into the stomach and below the included field of  view. Stable cardiac enlargement. Mild basilar lung opacities consistent with atelectasis. No convincing pneumonia. No pulmonary edema. No pneumothorax. IMPRESSION: 1. No convincing pneumonia. No pulmonary edema. Mild basilar atelectasis similar to previous day's study allowing for the differences in patient positioning. 2. New nasal/orogastric tube is well positioned. Remaining support apparatus is stable and well positioned. Electronically Signed   By: Amie Portland M.D.   On: 09/03/2018 10:54   Dg Chest Port 1 View  Result Date: 09/02/2018 CLINICAL DATA:  Endotracheal tube check EXAM: PORTABLE CHEST 1 VIEW COMPARISON:  09/01/2018 FINDINGS: Endotracheal tube is 5.7 cm from carina retraction of Swan-Ganz catheter. Central venous line and sheath remain. No pneumothorax. No pulmonary edema. IMPRESSION: 1. Endotracheal tube in good position. 2. Retraction of Swan-Ganz catheter. 3. No edema or pneumothorax. Electronically Signed   By: Genevive Bi M.D.   On: 09/02/2018 10:14    Labs:  CBC: Recent Labs    09/02/18 0409 09/03/18 0322 09/04/18 0332 09/05/18 0227  WBC 16.6* 17.9* 14.6* 18.0*  HGB 13.8 14.7 14.7 15.2  HCT 41.3 45.0 44.8 45.8  PLT 108* 131* 92* 121*    COAGS: Recent Labs    09/29/2018 1851  09/02/18 0409 09/03/18 0322 09/04/18 0332 09/05/18 0227  INR 2.58   < > 2.10 1.57 2.01 1.87  APTT 70*  --   --   --   --   --    < > = values in this interval not displayed.    BMP: Recent Labs    09/02/18 0409 09/03/18 0322 09/04/18 0332 09/05/18 0227  NA 142 146* 151* 154*  K 4.3 4.1 3.7 4.3  CL 106 108 114* 117*  CO2 28 32 30 32  GLUCOSE 149* 164* 124* 126*  BUN 67* 72* 81* 87*  CALCIUM 8.1* 8.2* 7.8* 7.8*  CREATININE 1.70* 1.67* 1.83* 1.58*  GFRNONAA 49* 50* 45* 53*  GFRAA 56* 58* 52* >60    LIVER FUNCTION TESTS: Recent Labs    09/02/18 0409 09/03/18 0322 09/04/18 0332 09/05/18 0227  BILITOT 9.2* 8.6* 11.9* 14.3*  AST 125* 93* 105* 100*  ALT 1,403*  1,045* 593* 393*  ALKPHOS 108 108 88 83  PROT 5.4* 5.6* 5.2* 5.2*  ALBUMIN 2.2* 2.2* 2.0* 1.9*  Assessment and Plan:  Perc chole drain placed 10/20 Cx NG Flushes well; draining well Op bile Will follow Will need 6-8 weeks--- [facility] to decide on surgery   Electronically Signed: Jackie Littlejohn A, PA-C 09/05/2018, 2:13 PM   I spent a total of 15 Minutes at the the patient's bedside AND on the patient's hospital floor or unit, greater than 50% of which was counseling/coordinating care for Chole drain

## 2018-09-05 NOTE — Progress Notes (Signed)
Dr Marchelle Gearing called re: pt continued fever despite packing with ice packs. New order placed.

## 2018-09-05 NOTE — Plan of Care (Signed)
  Problem: Education: Goal: Ability to demonstrate management of disease process will improve Outcome: Progressing   Problem: Nutrition: Goal: Adequate nutrition will be maintained Outcome: Progressing   Problem: Nutrition: Goal: Dietary intake will improve Outcome: Progressing

## 2018-09-05 NOTE — Progress Notes (Addendum)
Patient ID: Cody Patterson, male   DOB: 1976/05/23, 42 y.o.   MRN: [id number]       Subjective: Patient sedated on precedex.  [place] tech present repeating abdominal US.  She states his gallbladder is contracted with stones and his wall looks better than when she scanned him last week. (will wait for official read)  She does report that she does not see any CBD stones.  Asked to resee patient secondary to fevers from 101-105 over the last several days.  IR has evaluated his drain a few minutes ago and stated that his drain is working well and is flushing well.  His LFTs are improving, except his TB is up to 14.8 on his morning labs, but on his fractionated labs, was only 11.8 direct 7, indirect 4.  Was 9 on Tuesday.  Tolerating TFs well with no issues.  Objective: Vital signs in last 24 hours: Temp:  [98.2 F (36.8 C)-102.6 F (39.2 C)] 101.8 F (38.8 C) (10/25 1430) Pulse Rate:  [37-100] 79 (10/25 1430) Resp:  [0-31] 18 (10/25 1430) BP: (68-129)/(46-99) 117/85 (10/25 1430) SpO2:  [91 %-100 %] 100 % (10/25 1430) FiO2 (%):  [40 %] 40 % (10/25 1200) Weight:  [100.7 kg-102.6 kg] 100.7 kg (10/25 0800) Last BM Date: 09/05/2018  Intake/Output from previous day: 10/24 0701 - 10/25 0700 In: 3820.9 [I.V.:1237.9; NG/GT:1115.7; IV Piggyback:1367.3] Out: 4105 [Urine:3995; Drains:110] Intake/Output this shift: Total I/O In: 1510.5 [I.V.:477.9; NG/GT:772.5; IV Piggyback:260.1] Out: 1240 [Urine:1220; Drains:20]  PE: Abd: soft, unable to assess tenderness secondary to his sedation, +BS, perc chole drain in place with bilious output 20cc documented so far on this shift.  Lab Results:  Recent Labs    09/04/18 0332 09/05/18 0227  WBC 14.6* 18.0*  HGB 14.7 15.2  HCT 44.8 45.8  PLT 92* 121*   BMET Recent Labs    09/04/18 0332 09/05/18 0227  NA 151* 154*  K 3.7 4.3  CL 114* 117*  CO2 30 32  GLUCOSE 124* 126*  BUN 81* 87*  CREATININE 1.83* 1.58*  CALCIUM 7.8* 7.8*   PT/INR Recent Labs    09/04/18 0332 09/05/18 0227  LABPROT 22.5* 21.3*  INR 2.01 1.87   CMP     Component Value Date/Time   NA 154 (H) 09/05/2018 0227   K 4.3 09/05/2018 0227   CL 117 (H) 09/05/2018 0227   CO2 32 09/05/2018 0227   GLUCOSE 126 (H) 09/05/2018 0227   BUN 87 (H) 09/05/2018 0227   CREATININE 1.58 (H) 09/05/2018 0227   CALCIUM 7.8 (L) 09/05/2018 0227   PROT 5.2 (L) 09/05/2018 0227   ALBUMIN 1.9 (L) 09/05/2018 0227   AST 100 (H) 09/05/2018 0227   ALT 393 (H) 09/05/2018 0227   ALKPHOS 83 09/05/2018 0227   BILITOT 14.3 (H) 09/05/2018 0227   GFRNONAA 53 (L) 09/05/2018 0227   GFRAA >60 09/05/2018 0227   Lipase  No results found for: LIPASE     Studies/Results: Ct Head Wo Contrast  Result Date: 09/04/2018 CLINICAL DATA:  42 y/o  M; decreased responsiveness, encephalopathy. EXAM: CT HEAD WITHOUT CONTRAST TECHNIQUE: Contiguous axial images were obtained from the base of the skull through the vertex without intravenous contrast. COMPARISON:  10/13/2015 CT head. FINDINGS: Brain: Small area of hypoattenuation with focal loss of gray-white differentiation in the right parietal lobe. No associated hemorrhage or mass effect. No extra-axial collection, hydrocephalus, or herniation. Vascular: Calcific atherosclerosis of carotid siphons. No hyperdense vessel identified. Skull: Normal. Negative for fracture or focal  lesion. Sinuses/Orbits: No acute finding. Other: None. IMPRESSION: Small area of hypoattenuation with focal loss of gray-white differentiation in the right parietal lobe, suspected recent acute to subacute infarction. No associated hemorrhage or mass effect. These results will be called to the ordering clinician or representative by the Radiologist Assistant, and communication documented in the PACS or zVision Dashboard. Electronically Signed   By: Mitzi Hansen M.D.   On: 09/04/2018 15:18   Mr Maxine Glenn Head Wo Contrast  Result Date: 09/05/2018 CLINICAL DATA:  Altered mental status,  follow-up infarct. History of polysubstance abuse, cardiomyopathy. EXAM: MRI HEAD WITHOUT CONTRAST MRA HEAD WITHOUT CONTRAST TECHNIQUE: Multiplanar, multiecho pulse sequences of the brain and surrounding structures were obtained without intravenous contrast. Angiographic images of the head were obtained using MRA technique without contrast. COMPARISON:  CT HEAD September 04, 2018 FINDINGS: MRI HEAD FINDINGS INTRACRANIAL CONTENTS: Patchy reduced diffusion RIGHT parietal lobe, with subcentimeter foci reduced diffusion RIGHT frontal and temporal lobes, areas of diffusion abnormality demonstrate faintly decreased ADC values. No susceptibility artifact to suggest hemorrhage or septic emboli. A few scattered subcentimeter supratentorial white matter FLAIR T2 hyperintensities exclusive aforementioned abnormality. No midline shift, mass effect or masses. No abnormal extra-axial fluid collections. Borderline parenchymal brain volume loss for age. No hydrocephalus. VASCULAR: T2 bright signal RIGHT ICA. SKULL AND UPPER CERVICAL SPINE: No abnormal sellar expansion. No suspicious calvarial bone marrow signal. Craniocervical junction maintained. SINUSES/ORBITS: Bilateral mastoid effusions. Paranasal sinuses are well aerated. Secretions layering in the nasopharynx. Included ocular globes and orbital contents are non-suspicious. OTHER: Patient is edentulous.  Life support lines in place. MRA HEAD FINDINGS ANTERIOR CIRCULATION: Loss of RIGHT cervical internal carotid artery flow related enhancement, thready within RIGHT petrous to supraclinoid segments. Patent LEFT internal carotid artery. Patent anterior communicating artery and anterior cerebral arteries. Patent though slow flow RIGHT middle cerebral artery. Normal LEFT MCA. No flow limiting stenosis, aneurysm. POSTERIOR CIRCULATION: Codominant vertebral arteries. Vertebrobasilar arteries are patent, with normal flow related enhancement of the main branch vessels. Patent posterior  cerebral arteries. No large vessel occlusion, flow limiting stenosis,  aneurysm. ANATOMIC VARIANTS: None. Source images and MIP images were reviewed. IMPRESSION: MRI HEAD: 1. 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 2. Borderline parenchymal brain volume loss for age. 3. Minimal chronic small vessel ischemic changes. MRA HEAD: 1. 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 Recommend CTA head and neck. These results will be called to the ordering clinician or representative by the Radiologist Assistant, and communication documented in the PACS or zVision Dashboard. Electronically Signed   By: Awilda Metro M.D.   On: 09/05/2018 06:30   Mr Brain Wo Contrast  Result Date: 09/05/2018 CLINICAL DATA:  Altered mental status, follow-up infarct. History of polysubstance abuse, cardiomyopathy. EXAM: MRI HEAD WITHOUT CONTRAST MRA HEAD WITHOUT CONTRAST TECHNIQUE: Multiplanar, multiecho pulse sequences of the brain and surrounding structures were obtained without intravenous contrast. Angiographic images of the head were obtained using MRA technique without contrast. COMPARISON:  CT HEAD September 04, 2018 FINDINGS: MRI HEAD FINDINGS INTRACRANIAL CONTENTS: Patchy reduced diffusion RIGHT parietal lobe, with subcentimeter foci reduced diffusion RIGHT frontal and temporal lobes, areas of diffusion abnormality demonstrate faintly decreased ADC values. No susceptibility artifact to suggest hemorrhage or septic emboli. A few scattered subcentimeter supratentorial white matter FLAIR T2 hyperintensities exclusive aforementioned abnormality. No midline shift, mass effect or masses. No abnormal extra-axial fluid collections. Borderline parenchymal brain volume loss for age. No hydrocephalus. VASCULAR: T2  bright signal RIGHT ICA. SKULL AND UPPER CERVICAL SPINE: No abnormal sellar expansion. No suspicious calvarial bone  marrow signal. Craniocervical junction maintained. SINUSES/ORBITS: Bilateral mastoid effusions. Paranasal sinuses are well aerated. Secretions layering in the nasopharynx. Included ocular globes and orbital contents are non-suspicious. OTHER: Patient is edentulous.  Life support lines in place. MRA HEAD FINDINGS ANTERIOR CIRCULATION: Loss of RIGHT cervical internal carotid artery flow related enhancement, thready within RIGHT petrous to supraclinoid segments. Patent LEFT internal carotid artery. Patent anterior communicating artery and anterior cerebral arteries. Patent though slow flow RIGHT middle cerebral artery. Normal LEFT MCA. No flow limiting stenosis, aneurysm. POSTERIOR CIRCULATION: Codominant vertebral arteries. Vertebrobasilar arteries are patent, with normal flow related enhancement of the main branch vessels. Patent posterior cerebral arteries. No large vessel occlusion, flow limiting stenosis,  aneurysm. ANATOMIC VARIANTS: None. Source images and MIP images were reviewed. IMPRESSION: MRI HEAD: 1. 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 2. Borderline parenchymal brain volume loss for age. 3. Minimal chronic small vessel ischemic changes. MRA HEAD: 1. 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 Recommend CTA head and neck. These results will be called to the ordering clinician or representative by the Radiologist Assistant, and communication documented in the PACS or zVision Dashboard. Electronically Signed   By: Awilda Metro M.D.   On: 09/05/2018 06:30   Dg Chest Port 1 View  Result Date: 09/05/2018 CLINICAL DATA:  42 year old male with a history of endotracheal tube placement EXAM: PORTABLE CHEST 1 VIEW COMPARISON:  09/04/2018 FINDINGS: Cardiomediastinal silhouette unchanged. Improved aeration compared to the prior plain film with persistently low lung volumes. No pneumothorax. No  large pleural effusion. Similar appearance of coarsened interstitial markings. Endotracheal tube terminates suitably above the carina approximately 5 cm. Unchanged right subclavian central catheter terminating in the region of the superior cavoatrial junction. Unchanged right IJ sheath. Unchanged gastric tube terminating out of the field of view. IMPRESSION: Improved aeration at the comparison plain film, with evidence of mild edema and/or atelectasis. Endotracheal tube terminates suitably above the carina. Unchanged right subclavian central catheter, right IJ sheath, and gastric tube Electronically Signed   By: Gilmer Mor D.O.   On: 09/05/2018 11:00   Dg Chest Port 1 View  Result Date: 09/04/2018 CLINICAL DATA:  Respiratory failure. EXAM: PORTABLE CHEST 1 VIEW COMPARISON:  Radiograph of September 03, 2018. FINDINGS: Stable position of endotracheal and nasogastric tubes. Stable position of right subclavian catheter. Stable position of right internal jugular venous sheath. No pneumothorax is noted. Stable bibasilar subsegmental atelectasis is noted, right greater than left. No significant pleural effusion is noted. Bony thorax is unremarkable. IMPRESSION: Stable support apparatus. Stable bibasilar subsegmental atelectasis. Electronically Signed   By: Lupita Raider, M.D.   On: 09/04/2018 10:33    Anti-infectives: Anti-infectives (From admission, onward)   Start     Dose/Rate Route Frequency Ordered Stop   09/05/18 1000  vancomycin (VANCOCIN) 1,250 mg in sodium chloride 0.9 % 250 mL IVPB     1,250 mg 166.7 mL/hr over 90 Minutes Intravenous Every 24 hours 09/04/18 0922     09/04/18 0930  vancomycin (VANCOCIN) 1,750 mg in sodium chloride 0.9 % 500 mL IVPB     1,750 mg 250 mL/hr over 120 Minutes Intravenous  Once 09/04/18 0922 09/04/18 1239   08/31/18 2200  meropenem (MERREM) 2 g in sodium chloride 0.9 % 100 mL IVPB     2 g 200 mL/hr over 30  Minutes Intravenous Every 8 hours 08/31/18 1239      08/30/18 0730  vancomycin (VANCOCIN) 1,250 mg in sodium chloride 0.9 % 250 mL IVPB  Status:  Discontinued     1,250 mg 166.7 mL/hr over 90 Minutes Intravenous Every 24 hours 08/30/18 0728 08/30/18 0803   08/30/18 0600  vancomycin (VANCOCIN) 1,250 mg in sodium chloride 0.9 % 250 mL IVPB  Status:  Discontinued     1,250 mg 166.7 mL/hr over 90 Minutes Intravenous Every 24 hours 08/30/18 0500 09/01/18 1027   08/31/2018 1800  vancomycin (VANCOCIN) IVPB 750 mg/150 ml premix  Status:  Discontinued     750 mg 150 mL/hr over 60 Minutes Intravenous Every 12 hours 08-31-18 0446 Aug 31, 2018 1445   2018-08-31 1445  vancomycin variable dose per unstable renal function (pharmacist dosing)  Status:  Discontinued      Does not apply See admin instructions 2018-08-31 1445 09/01/18 1101   2018/08/31 1000  meropenem (MERREM) 2 g in sodium chloride 0.9 % 100 mL IVPB  Status:  Discontinued     2 g 200 mL/hr over 30 Minutes Intravenous Every 12 hours 08-31-18 0831 08/31/18 1239   08/31/2018 0700  aztreonam (AZACTAM) 1 g in sodium chloride 0.9 % 100 mL IVPB  Status:  Discontinued     1 g 200 mL/hr over 30 Minutes Intravenous Every 8 hours 08-31-18 0655 08-31-18 0831   2018/08/31 0530  vancomycin (VANCOCIN) 1,500 mg in sodium chloride 0.9 % 500 mL IVPB     1,500 mg 250 mL/hr over 120 Minutes Intravenous  Once 08/31/2018 0446 08/29/18 0716   2018/08/31 0515  aztreonam (AZACTAM) 1 g in sodium chloride 0.9 % 100 mL IVPB  Status:  Discontinued     1 g 200 mL/hr over 30 Minutes Intravenous Every 8 hours Aug 31, 2018 0446 08/31/2018 0655       Assessment/Plan Cardiogenic shock Systolic heart failure with EF of 10% Acute renal failure Acute liver failure Polysubstance abuse Noncompliance CVA  Sepsis with shock  Probable cholecystitis/cholelithiasis -s/p perc chole drain on 10-20 by IR -cont abx therapy, likely needs about a week for his gb -leave drain for at least 6-8 weeks. Will have to determine if patient is a surgical  candidate as weeks progress, but is still not a surgical candidate at this time. If his heart function were to not improve and his overall condition remain poor, he may not likely be a surgical candidate in the future. Time will tell. -repeat US today shows decrease in gallbladder wall thickening, decompressed gallbladder which is improvement from US done prior to perc chole drain.  His TB is around 11.  Direct is higher than indirect which can be indicative of an obstructive process, but his CBD was normal on [place] with no evidence of CDB stone.  I still suspect his TB elevation is likely secondary to cholestasis given his overall medical status.  If further evaluation of his TB is needed, then I would recommend a GI evaluation, but doubt just elevation in his TB with no other current findings is source of his fevers.  His gallbladder appears to be improving though and is unlikely to be the source of his persistent high fevers as it is well decompressed with his drain.  -no surgical indications, consider GI eval if desired for further recommendations for elevated TB.   FEN -IVF/TFs VTE -SCDs ID -Merrem/Vanc   LOS: 9 days    Letha Cape , [facility] Surgery 09/05/2018, 2:45 PM Pager:  [phone]  

## 2018-09-05 NOTE — Progress Notes (Addendum)
Patient ID: Cody Patterson, male   DOB: 1976/04/27, 42 y.o.   MRN: [id number]     Advanced Heart Failure Rounding Note  PCP-Cardiologist: No primary care provider on file.   Subjective:    Remains on milrinone 0.125 mcg/kg/min. Coox 59%. Back on Norepi @ 8 with goal SBP >110 per neuro.  In NSR on amio drip 30 mg/hr. Up to 150s sinus tach yesterday.   Tmax 100.3. WBC 20 > 17.9 > 14.9 > 16.6 > 17.6 > 14.6 > 18.0. +H influenzae in sputum. Tapering down steroids. Remains on meropenum. Vanc added yesterday. Blood cultures redrawn 10/23 NGTD.  Abdominal US strongly suggestive of acute cholecystitis, not surgical candidate. Underwent percutaneous cholecystostomy 10/20. Bili back up 10.9 > 9.2 > 8.6 > 11.9 > 14.3  INR 1.87. Not on any AC.   Creatinine 3.09 => 2.16 => 1.92 => 1.7 => 1.67 => 1.81 => 1.58. Good UOP with IV lasix yesterday. Weight down 4 lbs. CVP 6  Remains intubated. Only on precedex for sedation. FiO2 40%. No purposeful movements.  ABG: pH 7.48, pCO2 41.6, pO2 160, Bicarb 30.8  Yesterday became very tachypneic and tachycardic. Head CT showed: Small area of hypoattenuation with focal loss of gray-white differentiation in the right parietal lobe, suspected recent acute to subacute infarction. Neuro consulted and MRI ordered for this am.   MRI 10/25: MRI HEAD: 1. 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 2. Borderline parenchymal brain volume loss for age. 3. Minimal chronic small vessel ischemic changes.  MRA HEAD: 1. 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 Recommend CTA head and neck.  Objective:   Weight Range: 102.6 kg Body mass index is 30.68 kg/m.   Vital Signs:   Temp:  [98.2 F (36.8 C)-100.7 F (38.2 C)] 98.2 F (36.8 C) (10/25 0746) Pulse Rate:  [37-143] 75 (10/25 0800) Resp:  [0-41] 27 (10/25 0800) BP: (68-154)/(27-131) 89/69 (10/25  0800) SpO2:  [90 %-100 %] 97 % (10/25 0800) FiO2 (%):  [30 %-40 %] 40 % (10/25 0800) Weight:  [102.6 kg] 102.6 kg (10/25 0500) Last BM Date: 08/18/2018  Weight change: Filed Weights   09/03/18 0448 09/04/18 0500 09/05/18 0500  Weight: 103.9 kg 104.7 kg 102.6 kg    Intake/Output:   Intake/Output Summary (Last 24 hours) at 09/05/2018 0839 Last data filed at 09/05/2018 0800 Gross per 24 hour  Intake 3714.44 ml  Output 4075 ml  Net -360.56 ml      Physical Exam   General: Intubated/sedated.  HEENT: + ETT + OGT. No corneal reflexes Neck: Supple. JVP 6. Carotids 2+ bilat; no bruits. No thyromegaly or nodule noted. Cor: PMI nondisplaced. RRR, No M/G/R noted. Right [place] TLC. Lungs: Diminished basilar sounds Abdomen: Soft, non-tender, non-distended, no HSM. No bruits or masses. +BS + RUQ choley drain Extremities: No cyanosis, clubbing, or rash. 1+ BLE edema. Cold to touch. Neuro: Intubated/sedated. No movement to nailbed pressure  Telemetry   Sinus tach to 150s yesterday afternoon, NSR 80s this morning Personally reivewed.   EKG    No new tracings.  Labs    CBC Recent Labs    09/03/18 0322 09/04/18 0332 09/05/18 0227  WBC 17.9* 14.6* 18.0*  NEUTROABS 15.0*  --   --   HGB 14.7 14.7 15.2  HCT 45.0 44.8 45.8  MCV 85.2 84.1 86.3  PLT 131* 92* 416*   Basic Metabolic Panel Recent Labs    09/03/18 0322 09/04/18  5681 09/05/18 0227  NA 146* 151* 154*  K 4.1 3.7 4.3  CL 108 114* 117*  CO2 32 30 32  GLUCOSE 164* 124* 126*  BUN 72* 81* 87*  CREATININE 1.67* 1.83* 1.58*  CALCIUM 8.2* 7.8* 7.8*  MG 2.2 2.5*  --   PHOS 5.1* 4.2  --    Liver Function Tests Recent Labs    09/04/18 0332 09/05/18 0227  AST 105* 100*  ALT 593* 393*  ALKPHOS 88 83  BILITOT 11.9* 14.3*  PROT 5.2* 5.2*  ALBUMIN 2.0* 1.9*   No results for input(s): LIPASE, AMYLASE in the last 72 hours. Cardiac Enzymes No results for input(s): CKTOTAL, CKMB, CKMBINDEX, TROPONINI in the last 72  hours.  BNP: BNP (last 3 results) No results for input(s): BNP in the last 8760 hours.  ProBNP (last 3 results) No results for input(s): PROBNP in the last 8760 hours.   D-Dimer No results for input(s): DDIMER in the last 72 hours. Hemoglobin A1C Recent Labs    09/05/18 0227  HGBA1C 6.2*   Fasting Lipid Panel Recent Labs    09/05/18 0227  CHOL 125  HDL <10*  LDLCALC NOT CALCULATED  TRIG 208*  CHOLHDL NOT CALCULATED   Thyroid Function Tests No results for input(s): TSH, T4TOTAL, T3FREE, THYROIDAB in the last 72 hours.  Invalid input(s): FREET3  Other results:   Imaging    Ct Head Wo Contrast  Result Date: 09/04/2018 CLINICAL DATA:  42 y/o  M; decreased responsiveness, encephalopathy. EXAM: CT HEAD WITHOUT CONTRAST TECHNIQUE: Contiguous axial images were obtained from the base of the skull through the vertex without intravenous contrast. COMPARISON:  10/13/2015 CT head. FINDINGS: Brain: Small area of hypoattenuation with focal loss of gray-white differentiation in the right parietal lobe. No associated hemorrhage or mass effect. No extra-axial collection, hydrocephalus, or herniation. Vascular: Calcific atherosclerosis of carotid siphons. No hyperdense vessel identified. Skull: Normal. Negative for fracture or focal lesion. Sinuses/Orbits: No acute finding. Other: None. IMPRESSION: Small area of hypoattenuation with focal loss of gray-white differentiation in the right parietal lobe, suspected recent acute to subacute infarction. No associated hemorrhage or mass effect. These results will be called to the ordering clinician or representative by the Radiologist Assistant, and communication documented in the PACS or zVision Dashboard. Electronically Signed   By: Kristine Garbe M.D.   On: 09/04/2018 15:18   Mr [name] Head Wo Contrast  Result Date: 09/05/2018 CLINICAL DATA:  Altered mental status, follow-up infarct. History of polysubstance abuse, cardiomyopathy. EXAM:  MRI HEAD WITHOUT CONTRAST MRA HEAD WITHOUT CONTRAST TECHNIQUE: Multiplanar, multiecho pulse sequences of the brain and surrounding structures were obtained without intravenous contrast. Angiographic images of the head were obtained using MRA technique without contrast. COMPARISON:  CT HEAD September 04, 2018 FINDINGS: MRI HEAD FINDINGS INTRACRANIAL CONTENTS: Patchy reduced diffusion RIGHT parietal lobe, with subcentimeter foci reduced diffusion RIGHT frontal and temporal lobes, areas of diffusion abnormality demonstrate faintly decreased ADC values. No susceptibility artifact to suggest hemorrhage or septic emboli. A few scattered subcentimeter supratentorial white matter FLAIR T2 hyperintensities exclusive aforementioned abnormality. No midline shift, mass effect or masses. No abnormal extra-axial fluid collections. Borderline parenchymal brain volume loss for age. No hydrocephalus. VASCULAR: T2 bright signal RIGHT ICA. SKULL AND UPPER CERVICAL SPINE: No abnormal sellar expansion. No suspicious calvarial bone marrow signal. Craniocervical junction maintained. SINUSES/ORBITS: Bilateral mastoid effusions. Paranasal sinuses are well aerated. Secretions layering in the nasopharynx. Included ocular globes and orbital contents are non-suspicious. OTHER: Patient is edentulous.  Life  support lines in place. MRA HEAD FINDINGS ANTERIOR CIRCULATION: Loss of RIGHT cervical internal carotid artery flow related enhancement, thready within RIGHT petrous to supraclinoid segments. Patent LEFT internal carotid artery. Patent anterior communicating artery and anterior cerebral arteries. Patent though slow flow RIGHT middle cerebral artery. Normal LEFT MCA. No flow limiting stenosis, aneurysm. POSTERIOR CIRCULATION: Codominant vertebral arteries. Vertebrobasilar arteries are patent, with normal flow related enhancement of the main branch vessels. Patent posterior cerebral arteries. No large vessel occlusion, flow limiting stenosis,   aneurysm. ANATOMIC VARIANTS: None. Source images and MIP images were reviewed. IMPRESSION: MRI HEAD: 1. 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 2. Borderline parenchymal brain volume loss for age. 3. Minimal chronic small vessel ischemic changes. MRA HEAD: 1. 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 Recommend CTA head and neck. These results will be called to the ordering clinician or representative by the Radiologist Assistant, and communication documented in the PACS or zVision Dashboard. Electronically Signed   By: Elon Alas M.D.   On: 09/05/2018 06:30   Mr Brain Wo Contrast  Result Date: 09/05/2018 CLINICAL DATA:  Altered mental status, follow-up infarct. History of polysubstance abuse, cardiomyopathy. EXAM: MRI HEAD WITHOUT CONTRAST MRA HEAD WITHOUT CONTRAST TECHNIQUE: Multiplanar, multiecho pulse sequences of the brain and surrounding structures were obtained without intravenous contrast. Angiographic images of the head were obtained using MRA technique without contrast. COMPARISON:  CT HEAD September 04, 2018 FINDINGS: MRI HEAD FINDINGS INTRACRANIAL CONTENTS: Patchy reduced diffusion RIGHT parietal lobe, with subcentimeter foci reduced diffusion RIGHT frontal and temporal lobes, areas of diffusion abnormality demonstrate faintly decreased ADC values. No susceptibility artifact to suggest hemorrhage or septic emboli. A few scattered subcentimeter supratentorial white matter FLAIR T2 hyperintensities exclusive aforementioned abnormality. No midline shift, mass effect or masses. No abnormal extra-axial fluid collections. Borderline parenchymal brain volume loss for age. No hydrocephalus. VASCULAR: T2 bright signal RIGHT ICA. SKULL AND UPPER CERVICAL SPINE: No abnormal sellar expansion. No suspicious calvarial bone marrow signal. Craniocervical junction maintained. SINUSES/ORBITS:  Bilateral mastoid effusions. Paranasal sinuses are well aerated. Secretions layering in the nasopharynx. Included ocular globes and orbital contents are non-suspicious. OTHER: Patient is edentulous.  Life support lines in place. MRA HEAD FINDINGS ANTERIOR CIRCULATION: Loss of RIGHT cervical internal carotid artery flow related enhancement, thready within RIGHT petrous to supraclinoid segments. Patent LEFT internal carotid artery. Patent anterior communicating artery and anterior cerebral arteries. Patent though slow flow RIGHT middle cerebral artery. Normal LEFT MCA. No flow limiting stenosis, aneurysm. POSTERIOR CIRCULATION: Codominant vertebral arteries. Vertebrobasilar arteries are patent, with normal flow related enhancement of the main branch vessels. Patent posterior cerebral arteries. No large vessel occlusion, flow limiting stenosis,  aneurysm. ANATOMIC VARIANTS: None. Source images and MIP images were reviewed. IMPRESSION: MRI HEAD: 1. 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 2. Borderline parenchymal brain volume loss for age. 3. Minimal chronic small vessel ischemic changes. MRA HEAD: 1. 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 Recommend CTA head and neck. These results will be called to the ordering clinician or representative by the Radiologist Assistant, and communication documented in the PACS or zVision Dashboard. Electronically Signed   By: Elon Alas M.D.   On: 09/05/2018 06:30     Medications:     Scheduled Medications: . chlorhexidine gluconate (MEDLINE KIT)  15 mL Mouth Rinse BID  . Chlorhexidine Gluconate Cloth  6  each Topical Daily  . feeding supplement (PRO-STAT SUGAR FREE 64)  60 mL Per Tube TID  . feeding supplement (VITAL HIGH PROTEIN)  1,000 mL Per Tube Q24H  . folic acid  1 mg Intravenous Daily  . free water  200 mL Per Tube Q8H  . heparin injection  (subcutaneous)  5,000 Units Subcutaneous Q8H  . insulin aspart  0-9 Units Subcutaneous Q4H  . mouth rinse  15 mL Mouth Rinse 10 times per day  . multivitamin  15 mL Per Tube Daily  . sodium chloride flush  10-40 mL Intracatheter Q12H  . sodium chloride flush  5 mL Intracatheter Q8H  . thiamine injection  100 mg Intravenous Daily    Infusions: . sodium chloride Stopped (08/30/2018 6629)  . sodium chloride 10 mL/hr at 09/05/18 0700  . amiodarone 30 mg/hr (09/05/18 0800)  . dexmedetomidine (PRECEDEX) IV infusion 1.2 mcg/kg/hr (09/05/18 0800)  . famotidine (PEPCID) IV 20 mg (09/04/18 1007)  . meropenem (MERREM) IV 2 g (09/05/18 0706)  . milrinone 0.125 mcg/kg/min (09/05/18 0800)  . norepinephrine (LEVOPHED) Adult infusion 3 mcg/min (09/05/18 0800)  . vancomycin      PRN Medications: ibuprofen, midazolam, sodium chloride flush    Patient Profile   [place] Grosser is a 42 y.o. 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   Presented to [facility] 10/16 with worsening dyspnea. Became hypotensive and with respiratory distress requiring pressor support and intubation. Transferred to [facility] for treatment and further evaluation.   Assessment/Plan   1. Shock: Mixed septic and cardiogenic shock.  SBP 100-110s. Remains off epinephrine, vaso, and norepi.  - H.flu in sputum. Repeat blood cultures NGTD. - Suspect primarily septic shock. WBC trending back up. Tmax 102 overnight.  2. Acute on chronic systolic CHF:  Echocardiogram from [facility] 10/16: LV moderately dilated, severely impaired EF at 10%, moderate RV dysfunction, moderately elevated RVSP.  Nonischemic cardiomyopathy pre-existed this hospitalization (had cath at [facility]).  Suspect cardiomyopathy is due to substance abuse (amphetamines).   - Remains on milrinone 0.125 mcg/kg/min. Coox 59%. CVP 6, weight down 4 lbs, but still up 24 lbs. - He is not candidate for VAD or transplant with active substance  abuse.  - Add TED hose to help with 3rd spacing.  3. Acute respiratory failure: Remains intubated and sedated. Suspect PNA. P/CCM managing. Sputum + H influenzae. As below, WBC trending up. CXR looks okay this am.  4. Acute CVA - Head CT yesterday showed suspected recent acute to subacute infarct. Neuro consulted. - MRI today showed acute to early subacute multifocal small RIGHT MCA territory infarcts. MRA showed occluded right ICA with thready intracranial reconstitution; slow flow RIGHT MCA without emergent cerebral artery occlusion. Recommend CTA head and neck. - Will need to consider heparin. Per neuro and primary and goals of care conversation. 5. Polysubstance abuse: UDS + amphetamines and THC. No change.   6. ID: Septic shock.  Has H influenzae in sputum, suspect PNA.  He also appears to have acute cholecystitis on abdominal US.  - Continue meropenem.  Vanc added yesterday. Tmax 105 on 10/23. Tmax 100.3 overnight. (on cooling blanket) WBC trending up, 14.6 > 18.0. - S/p percutaneous cholecystostomy tube 10/20 (per CCM). Had 75 mls out yeterday.. 7. Transaminitis: Elevated LFTs.  Suspect ischemic hepatitis with shock + acute cholecystitis. Transaminases trending down but tbili rising again, likely in setting of acute cholecystitis. No change.  8. Ileus: KUB 08/20/2018 with large illeus in  the setting of stool coming through OG tube.  - Improved. No change.  9. Atrial fibrillation, paroxysmal: Patient went into atrial fibrillation with RVR Saturday night and amio drip was increased. Remains in NSR today. Went into afib RVR vs sinus tach yesterday. - INR 1.8. He is not on [facility]. (received vitamin K 10/19 and 10/20 for choley drain)  - Continue amiodarone gtt 30 mg/hr.  10. Acute cholecystitis: - s/p perc drain. Leave in for 6-8 weeks and determine if he becomes a surgical candidate for surgery note. Put out 100 mls yesterday.   Length of Stay: [address] NP  09/05/2018, 8:39  AM  Advanced Heart Failure Team Pager [phone] (M-F; 7a - 4p)  Please contact [facility] for night-coverage after hours (4p -7a ) and weekends on [email or web address]  Patient seen with NP, agree with the above note.    Occluded RICA, small right MCA infarct. Has had paroxysmal atrial fibrillation, concern for cardioembolism.  - INR 1.8 today, will need to discuss heparin gtt with neurology.   He is in NSR on amiodarone gtt.   Mixed septic/cardiogenic shock.  He remains on norepinephrine 8.  Co-ox 59% with CVP 6.    Ongoing fevers with suspected HCAP and acute cholecystitis with cholecystostomy tube.  Remains on broad spectrum abx.   Prognosis appears poor at this point.   Loralie Champagne 09/05/2018

## 2018-09-05 NOTE — Progress Notes (Signed)
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 Presented to [facility] with increasing dyspnea. Acute decompensation with hypotension and severe respiratory distress requiring intubation. Transferred to [facility] for management of sepsis, advanced heart failure.  Growing H influenza in sputum.  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  Past Medical History  Known cardiomyopathy and drug abuse (THC and methamphetamines)  Significant Hospital Events   10/16- Intubated at [facility] 10/17- Worsening shock, maxed on pressors, right heart cath indicative of distributive shock, high output failure 10/18-Started on milrinone, lasix for reduced cardiac output. PA numbers show worsening CO and high wedge 10/19- Started making urine, pressors weaning down, continues on milrinone. Surgery consulted for cholecystitis 10/20- Mucus plug overnight requiring bag, lavage. Went into afib.  Perc chole drain placed by IR 10/23 Start SBT trials, failed due to tachypnea 10/24 [facility] type respirations > CT head which showed stroke  Consults: date of consult/date signed off & final recs:  10/17- Cardiology  10/19- Surgery signed on 09/02/2017 10/24 Neurology  Procedures (surgical and bedside):  10/16 [facility] R subclavian CVC 10/16 [facility] L femoral arterial line 10/17 PA catheter 10/20 Perc chole drain  Significant Diagnostic Tests:  Echocardiogram 10/17- Mild to moderate LV dilation with EF 20%, diffuse hypokinesis.  Restrictive diastolic function.  Mildly dilated RV with mildly decreased systolic function. No significant valvular abnormalities Abdominal ultrasound 08/29/2018- severe gallbladder thickening, sludge, cholelithiasis, small volume of perihepatic, pericholecystic and perinephric free fluid. UDS [facility] 10/16- Amphetamines, THC [place] 10/18 Initial numbers RA 10 PA 46/33 (41) PCWP 10 Thermo CO/CI  15.2/7.0 Fick 14.1/6.5 (co-ox 86%) SVR 231 CT head 10/24 > small area of hypoattenuation in right parietal lobe suspicious for recent acute to subacute infarct. 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  Occluded R ICA, slow flow R MCA.  Micro Data:  Bcx 10/17 >> ng Trach aspirate 10/17- H influenza  Antimicrobials:  Vancomycin 10/17 > 10/21. 10/24 >  Meropenen 10/17 > Aztreonam 10/17>> off  Subjective:  [place] respirations yesterday evening, CT head showed infarct.  Confirmed via MRI this AM. Tmax 101.  Objective   Blood pressure (!) 89/69, pulse 75, temperature 98.2 F (36.8 C), temperature source Oral, resp. rate (!) 27, height 6' (1.829 m), weight 102.6 kg, SpO2 97 %. CVP:  [0 mmHg-13 mmHg] 6 mmHg  Vent Mode: PRVC FiO2 (%):  [30 %-40 %] 40 % Set Rate:  [14 bmp] 14 bmp Vt Set:  [540 mL] 540 mL PEEP:  [5 cmH20] 5 cmH20 Pressure Support:  [12 cmH20] 12 cmH20 Plateau Pressure:  [16 cmH20-19 cmH20] 16 cmH20   Intake/Output Summary (Last 24 hours) at 09/05/2018 0855 Last data filed at 09/05/2018 0800 Gross per 24 hour  Intake 3714.44 ml  Output 4075 ml  Net -360.56 ml   Filed Weights   09/03/18 0448 09/04/18 0500 09/05/18 0500  Weight: 103.9 kg 104.7 kg 102.6 kg   Examination: General: Well-nourished well-developed male, in NAD HEENT: Endotracheal tube in place, gastric tube in place, MMM Neuro: Sedated,  does not follow commands or withdraw to noxious stimuli CV: RRR, no M/R/G PULM: CTAB GT:XMIW, non-tender, bsx4  active  Extremities: warm/dry, 2+ edema  Skin: no rashes or lesions   Assessment & Plan:   Shock - Cardiogenic (AoC sCHF with cardiomyopathy and EF 10%), septic combination.  Shock physiology resolved and has been off all pressors up until evening of 10/24 when required low dose norepi to support BP given new findings of MCA stroke. Severe cardiomyopathy, noncompliant. V tach, A. Fib (resolved with amiodarone). - Continue diuresis as tolerated, did not tolerate gtt.  Had good UOP with 40mg  x 2 doses 10/24; however, Na increased slightly.  Will hold off today. - Continue amio, milrinone per cards.  - Cardiology following, not a candidate for VAD given ongoing substance abuse. - Continue norepi as needed to maintain SBP > 110 (for stroke, NOT for shock).  Acute respiratory failure secondary to pulmonary edema and H. influenzae pneumonia. - Has not tolerated weaning thus far due to agitation and tachypnea (stroke likely playing a role too). - Continue merrem.  Severe sepsis-H. influenzae pneumonia, +/-  Acute cholecystitis. - Continue merrem and vanc, would continue for now given on and off fevers + elevated PCT. - Follow PCT, lactate.  Acute metabolic encephalopathy-related to sedation + ICU delirium + new stroke. - Wean precedex to off as able. - Lights on during day / off at night.  Right MCA stroke. - Neurology following. - Levophed as needed to maintain SBP > 110.  Renal failure. - Continue to follow urine output and creatinine. - Diuresis as able.  Had good UOP with 40mg  x 2 doses 10/24; however, Na increased slightly.  Will hold off today.  Hypernatremia. - Continue free water per tube, increase from 200 to 400 q8hrs.  Elevated LFTs, INR secondary to shock liver - gradually improving. Coagulopathy - resolved. - Monitor LFTs - Continue with percutaneous drain per IR.  Acute cholecystitis - s/p perc drain 10/20. - Continue drain x 4 - 6 weeks then can determine if  surgery will be necessary.  Disposition / Summary of Today's Plan 09/05/18   Continue abx. Neuro on board for new stroke. Palliative care consulted for goals of care discussions.  Best Practice    Code Status: full Family Communication: 09/04/2018 wife at bedside asleep.  Labs personally reviewed.  CBC: reactive leukocytosis. Recent Labs  Lab 09/01/18 0356 09/02/18 0409 09/03/18 0322 09/04/18 0332 09/05/18 0227  WBC 14.9* 16.6* 17.9* 14.6* 18.0*  NEUTROABS  --   --  15.0*  --   --   HGB 12.9* 13.8 14.7 14.7 15.2  HCT 39.7 41.3 45.0 44.8 45.8  MCV 85.0 83.6 85.2 84.1 86.3  PLT 109* 108* 131* 92* 121*    Basic Metabolic Panel: pending Recent Labs  Lab 08/31/18 0413  09/01/18 0356  09/01/18 2305 09/02/18 0409 09/03/18 0322 09/04/18 0332 09/05/18 0227  NA 136   < > 138   < > 141 142 146* 151* 154*  K 3.6   < > 3.6   < > 3.6 4.3 4.1 3.7 4.3  CL 99   < > 103   < > 102 106 108 114* 117*  CO2 27   < > 29   < > 27 28 32 30 32  GLUCOSE 150*   < > 138*   < > 128* 149* 164* 124* 126*  BUN 69*   < > 71*   < > 63* 67* 72* 81* 87*  CREATININE  2.16*   < > 1.92*   < > 1.65* 1.70* 1.67* 1.83* 1.58*  CALCIUM 7.7*   < > 7.8*   < > 7.9* 8.1* 8.2* 7.8* 7.8*  MG 2.0  --  2.1  --  2.0 2.0 2.2 2.5*  --   PHOS 3.1  --  4.3  --   --  4.1 5.1* 4.2  --    < > = values in this interval not displayed.   GFR: Estimated Creatinine Clearance: 77 mL/min (A) (by C-G formula based on SCr of 1.58 mg/dL (H)). Recent Labs  Lab 08/30/18 0310  09/01/18 0356 09/02/18 0409 09/03/18 0322 09/04/18 0332 09/04/18 0736 09/04/18 1204 09/05/18 0227  PROCALCITON 14.68  --   --   --   --   --   --  2.43 4.82  WBC 20.0*   < > 14.9* 16.6* 17.9* 14.6*  --   --  18.0*  LATICACIDVEN 3.1*  --  1.5  --   --   --  1.7  --   --    < > = values in this interval not displayed.    Liver Function Tests: Recent Labs  Lab 09/01/18 0356 09/02/18 0409 09/03/18 0322 09/04/18 0332 09/05/18 0227  AST 267* 125*  93* 105* 100*  ALT 2,214* 1,403* 1,045* 593* 393*  ALKPHOS 117 108 108 88 83  BILITOT 10.8* 9.2* 8.6* 11.9* 14.3*  PROT 5.4* 5.4* 5.6* 5.2* 5.2*  ALBUMIN 2.4* 2.2* 2.2* 2.0* 1.9*   No results for input(s): LIPASE, AMYLASE in the last 168 hours. Recent Labs  Lab 09/04/18 1437  AMMONIA 75*    ABG    Component Value Date/Time   PHART 7.478 (H) 09/05/2018 0350   PCO2ART 41.6 09/05/2018 0350   PO2ART 160.0 (H) 09/05/2018 0350   HCO3 30.8 (H) 09/05/2018 0350   TCO2 32 09/05/2018 0350   ACIDBASEDEF 0.8 08/29/2018 0340   O2SAT 100.0 09/05/2018 0350     Coagulation Profile: Recent Labs  Lab 09/01/18 0356 09/02/18 0409 09/03/18 0322 09/04/18 0332 09/05/18 0227  INR 2.33 2.10 1.57 2.01 1.87    Cardiac Enzymes: Recent Labs  Lab 08/30/18 0310  TROPONINI 0.65*    HbA1C: Hgb A1c MFr Bld  Date/Time Value Ref Range Status  09/05/2018 02:27 AM 6.2 (H) 4.8 - 5.6 % Final    Comment:    (NOTE) Pre diabetes:          5.7%-6.4% Diabetes:              >6.4% Glycemic control for   <7.0% adults with diabetes     CBG: Recent Labs  Lab 09/04/18 1557 09/04/18 1957 09/04/18 2339 09/05/18 0301 09/05/18 0742  GLUCAP 107* 134* 128* 107* 91    CC time: 30 min.  Rutherford Guys, [place] [facility] Pager: [phone]  or [phone] 09/05/2018, 8:55 AM

## 2018-09-05 NOTE — Progress Notes (Signed)
EEG completed, results pending. 

## 2018-09-06 ENCOUNTER — Inpatient Hospital Stay (HOSPITAL_COMMUNITY): Payer: Medicaid Other

## 2018-09-06 DIAGNOSIS — Z515 Encounter for palliative care: Secondary | ICD-10-CM

## 2018-09-06 LAB — URINE CULTURE: Culture: NO GROWTH

## 2018-09-06 LAB — POCT I-STAT, CHEM 8
BUN: 77 mg/dL — ABNORMAL HIGH (ref 6–20)
Calcium, Ion: 1.14 mmol/L — ABNORMAL LOW (ref 1.15–1.40)
Chloride: 113 mmol/L — ABNORMAL HIGH (ref 98–111)
Creatinine, Ser: 1.7 mg/dL — ABNORMAL HIGH (ref 0.61–1.24)
Glucose, Bld: 99 mg/dL (ref 70–99)
HCT: 52 % (ref 39.0–52.0)
Hemoglobin: 17.7 g/dL — ABNORMAL HIGH (ref 13.0–17.0)
Potassium: 4.6 mmol/L (ref 3.5–5.1)
Sodium: 155 mmol/L — ABNORMAL HIGH (ref 135–145)
TCO2: 33 mmol/L — ABNORMAL HIGH (ref 22–32)

## 2018-09-06 LAB — MAGNESIUM
Magnesium: 2.6 mg/dL — ABNORMAL HIGH (ref 1.7–2.4)
Magnesium: 2.7 mg/dL — ABNORMAL HIGH (ref 1.7–2.4)

## 2018-09-06 LAB — COMPREHENSIVE METABOLIC PANEL
ALK PHOS: 91 U/L (ref 38–126)
ALT: 337 U/L — AB (ref 0–44)
AST: 126 U/L — AB (ref 15–41)
Albumin: 2.1 g/dL — ABNORMAL LOW (ref 3.5–5.0)
Anion gap: 9 (ref 5–15)
BUN: 90 mg/dL — AB (ref 6–20)
CO2: 31 mmol/L (ref 22–32)
CREATININE: 1.61 mg/dL — AB (ref 0.61–1.24)
Calcium: 8.4 mg/dL — ABNORMAL LOW (ref 8.9–10.3)
Chloride: 113 mmol/L — ABNORMAL HIGH (ref 98–111)
GFR calc Af Amer: >60 mL/min (ref >60)
GFR, EST NON AFRICAN AMERICAN: 52 mL/min — AB (ref >60)
Glucose, Bld: 114 mg/dL — ABNORMAL HIGH (ref 70–99)
Potassium: 4.6 mmol/L (ref 3.5–5.1)
SODIUM: 153 mmol/L — AB (ref 135–145)
Total Bilirubin: 21.3 mg/dL (ref 0.3–1.2)
Total Protein: 6.2 g/dL — ABNORMAL LOW (ref 6.5–8.1)

## 2018-09-06 LAB — PROTIME-INR
INR: 1.66
Prothrombin Time: 19.4 seconds — ABNORMAL HIGH (ref 11.4–15.2)

## 2018-09-06 LAB — GLUCOSE, CAPILLARY
GLUCOSE-CAPILLARY: 87 mg/dL (ref 70–99)
GLUCOSE-CAPILLARY: 111 mg/dL — AB (ref 70–99)
Glucose-Capillary: 106 mg/dL — ABNORMAL HIGH (ref 70–99)
Glucose-Capillary: 107 mg/dL — ABNORMAL HIGH (ref 70–99)
Glucose-Capillary: 79 mg/dL (ref 70–99)
Glucose-Capillary: 96 mg/dL (ref 70–99)
Glucose-Capillary: 98 mg/dL (ref 70–99)

## 2018-09-06 LAB — CBC
HEMATOCRIT: 53 % — AB (ref 39.0–52.0)
HEMOGLOBIN: 16.6 g/dL (ref 13.0–17.0)
MCH: 27.5 pg (ref 26.0–34.0)
MCHC: 31.3 g/dL (ref 30.0–36.0)
MCV: 87.9 fL (ref 80.0–100.0)
Platelets: 99 10*3/uL — ABNORMAL LOW (ref 150–400)
RBC: 6.03 MIL/uL — ABNORMAL HIGH (ref 4.22–5.81)
RDW: 23.5 % — ABNORMAL HIGH (ref 11.5–15.5)
WBC: 23.1 10*3/uL — ABNORMAL HIGH (ref 4.0–10.5)
nRBC: 0.2 % (ref 0.0–0.2)

## 2018-09-06 LAB — PHOSPHORUS: Phosphorus: 5.9 mg/dL — ABNORMAL HIGH (ref 2.5–4.6)

## 2018-09-06 LAB — COOXEMETRY PANEL
Carboxyhemoglobin: 1.9 % — ABNORMAL HIGH (ref 0.5–1.5)
Methemoglobin: 1.4 % (ref 0.0–1.5)
O2 Saturation: 71 %
Total hemoglobin: 17.1 g/dL — ABNORMAL HIGH (ref 12.0–16.0)

## 2018-09-06 LAB — PROCALCITONIN: Procalcitonin: 5.06 ng/mL

## 2018-09-06 LAB — LACTIC ACID, PLASMA: Lactic Acid, Venous: 1.7 mmol/L (ref 0.5–1.9)

## 2018-09-06 MED ORDER — SENNOSIDES-DOCUSATE SODIUM 8.6-50 MG PO TABS
2.0000 | ORAL_TABLET | Freq: Two times a day (BID) | ORAL | Status: DC
  Administered 2018-09-06 – 2018-09-12 (×11): 2
  Filled 2018-09-06 (×11): qty 2

## 2018-09-06 MED ORDER — IBUPROFEN 100 MG/5ML PO SUSP
400.0000 mg | Freq: Once | ORAL | Status: AC
  Administered 2018-09-06: 400 mg
  Filled 2018-09-06: qty 20

## 2018-09-06 NOTE — Progress Notes (Signed)
[facility] called, Dr. Vassie Loll, due to pt temp maintaining at 39.1 despite ice packs, cooling blanket, HR maintaining 120s, SBP and DBP elevation 140s/110s.  Orders to admin 400 Ibuprofen and stop levo gtt. Continue to monitor.

## 2018-09-06 NOTE — Progress Notes (Signed)
Dr. Marchelle Gearing paged to ask if he would call the patient's spouse and give her an update.  This nurse asked about laxative for constipation per message that alerts on Epic and per patient has not had a BM x72 hours.  Advised he will call the spouse, Leia Alf and address constipation once CT scan results are received.
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
## 2018-09-06 NOTE — Progress Notes (Signed)
Patient ID: Cody Patterson, male   DOB: 11/29/1975, 42 y.o.   MRN: [id number]     Advanced Heart Failure Rounding Note  PCP-Cardiologist: No primary care provider on file.   Subjective:    Remains intubated and sedated. Has not woken up when sedation weaned. On milrinone 0.125 mcg/kg/min. Coox 71%. NE weaned off over night. In NSR on amio drip 30 mg/hr. On meropenem and vanc. WBC continues to climb.   Abdominal US strongly suggestive of acute cholecystitis, not surgical candidate. Underwent percutaneous cholecystostomy 10/20. Bili back up 10.9 > 9.2 > 8.6 > 11.9 > 14.3 > 21.3. Surgery feels drain is working well.   Head CT showed: Small area of hypoattenuation with focal loss of gray-white differentiation in the right parietal lobe, suspected recent acute to subacute infarction. MRI revealed occluded RICA.   MRI 10/25: MRI HEAD: 1. 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 2. Borderline parenchymal brain volume loss for age. 3. Minimal chronic small vessel ischemic changes.  MRA HEAD: 1. 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 Recommend CTA head and neck.  Objective:   Weight Range: 105.7 kg Body mass index is 31.6 kg/m.   Vital Signs:   Temp:  [98.4 F (36.9 C)-103.1 F (39.5 C)] 100 F (37.8 C) (10/26 0500) Pulse Rate:  [79-117] 107 (10/26 0500) Resp:  [14-27] 14 (10/26 0500) BP: (100-149)/(68-122) 119/94 (10/26 0500) SpO2:  [99 %-100 %] 99 % (10/26 0731) FiO2 (%):  [40 %] 40 % (10/26 0731) Weight:  [105.7 kg] 105.7 kg (10/26 0500) Last BM Date: 08/24/2018  Weight change: Filed Weights   09/05/18 0500 09/05/18 0800 09/06/18 0500  Weight: 102.6 kg 100.7 kg 105.7 kg    Intake/Output:   Intake/Output Summary (Last 24 hours) at 09/06/2018 0914 Last data filed at 09/06/2018 0800 Gross per 24 hour  Intake 2769.87 ml  Output 2500 ml  Net 269.87 ml       Physical Exam   General:  Intubated/sedated Jaudiced Not responding HEENT: scleral incterice Neck: supple. JVP 8 Carotids 2+ bilat; no bruits. No lymphadenopathy or thryomegaly appreciated. Cor: PMI nondisplaced.tachy regular  Lungs: clear anteriorly  Abdomen: soft, nontender, + distended. No hepatosplenomegaly. No bruits or masses. Good bowel sounds. Extremities: no cyanosis, clubbing, diffuse jaundiuce 3+ edema Neuro: intubated. Non-responsive   Telemetry   Sinus tach 100-105 Personally reviewed   EKG    No new tracings.  Labs    CBC Recent Labs    09/05/18 0227 09/06/18 0435  WBC 18.0* 23.1*  HGB 15.2 16.6  HCT 45.8 53.0*  MCV 86.3 87.9  PLT 121* 99*   Basic Metabolic Panel Recent Labs    09/04/18 0332 09/05/18 0227 09/06/18 0435  NA 151* 154* 153*  K 3.7 4.3 4.6  CL 114* 117* 113*  CO2 30 32 31  GLUCOSE 124* 126* 114*  BUN 81* 87* 90*  CREATININE 1.83* 1.58* 1.61*  CALCIUM 7.8* 7.8* 8.4*  MG 2.5* 2.4 2.6*  PHOS 4.2  --  5.9*   Liver Function Tests Recent Labs    09/05/18 0227 09/05/18 1411 09/06/18 0435  AST 100*  --  126*  ALT 393*  --  337*  ALKPHOS 83  --  91  BILITOT 14.3* 11.8* 21.3*  PROT 5.2*  --  6.2*  ALBUMIN 1.9*  --  2.1*   No results for input(s): LIPASE, AMYLASE in the last 72 hours. Cardiac Enzymes No results  for input(s): CKTOTAL, CKMB, CKMBINDEX, TROPONINI in the last 72 hours.  BNP: BNP (last 3 results) No results for input(s): BNP in the last 8760 hours.  ProBNP (last 3 results) No results for input(s): PROBNP in the last 8760 hours.   D-Dimer No results for input(s): DDIMER in the last 72 hours. Hemoglobin A1C Recent Labs    09/05/18 0227  HGBA1C 6.2*   Fasting Lipid Panel Recent Labs    09/05/18 0227  CHOL 125  HDL <10*  LDLCALC NOT CALCULATED  TRIG 208*  CHOLHDL NOT CALCULATED   Thyroid Function Tests No results for input(s): TSH, T4TOTAL, T3FREE, THYROIDAB in the last 72 hours.  Invalid  input(s): FREET3  Other results:   Imaging    US Abdomen Limited Ruq  Result Date: 09/05/2018 CLINICAL DATA:  Acute cholecystitis. Status post cholecystostomy placement. EXAM: ULTRASOUND ABDOMEN LIMITED RIGHT UPPER QUADRANT COMPARISON:  Ultrasound of August 29, 2018. FINDINGS: Gallbladder: Cholelithiasis is noted with mild gallbladder wall thickening measuring 4 mm. Gallbladder is decompressed secondary to drainage catheter placement. No sonographic Murphy's sign is noted. Common bile duct: Diameter: 2.6 mm which is within normal limits. Liver: No focal lesion identified. Normal echogenicity of hepatic parenchyma is noted. Portal vein is patent on color Doppler imaging with normal direction of blood flow towards the liver. IMPRESSION: Cholelithiasis with mild gallbladder wall thickening is noted. Gallbladder is decompressed secondary to cholecystostomy placement. Electronically Signed   By: Marijo Conception, M.D.   On: 09/05/2018 15:08     Medications:     Scheduled Medications: . chlorhexidine gluconate (MEDLINE KIT)  15 mL Mouth Rinse BID  . Chlorhexidine Gluconate Cloth  6 each Topical Daily  . feeding supplement (PRO-STAT SUGAR FREE 64)  60 mL Per Tube TID  . feeding supplement (VITAL HIGH PROTEIN)  1,000 mL Per Tube Q24H  . folic acid  1 mg Intravenous Daily  . free water  400 mL Per Tube Q8H  . heparin injection (subcutaneous)  5,000 Units Subcutaneous Q8H  . insulin aspart  0-9 Units Subcutaneous Q4H  . mouth rinse  15 mL Mouth Rinse 10 times per day  . multivitamin  15 mL Per Tube Daily  . sodium chloride flush  10-40 mL Intracatheter Q12H  . sodium chloride flush  5 mL Intracatheter Q8H  . thiamine injection  100 mg Intravenous Daily    Infusions: . sodium chloride Stopped (08/26/2018 7408)  . sodium chloride 10 mL/hr at 09/06/18 0800  . amiodarone 30 mg/hr (09/06/18 0800)  . famotidine (PEPCID) IV 20 mg (09/06/18 0809)  . fentaNYL infusion INTRAVENOUS 350 mcg/hr  (09/06/18 0844)  . meropenem (MERREM) IV 2 g (09/06/18 0542)  . milrinone 0.125 mcg/kg/min (09/06/18 0800)  . norepinephrine (LEVOPHED) Adult infusion Stopped (09/06/18 0410)  . vancomycin Stopped (09/05/18 1156)    PRN Medications: fentaNYL (SUBLIMAZE) injection, fentaNYL (SUBLIMAZE) injection, midazolam, sodium chloride flush    Patient Profile   Cody Patterson is a 42 y.o. 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   Presented to [facility] 10/16 with worsening dyspnea. Became hypotensive and with respiratory distress requiring pressor support and intubation. Transferred to [facility] for treatment and further evaluation.   Assessment/Plan   1. Shock: Mixed septic and cardiogenic shock. Now primarily septic. SBP 100-110s. - H.flu in sputum. Repeat blood cultures NGTD. - Suspect primarily septic shock. WBC trending back up. Progressive cholestatic picture.  - on vanc and meropenem. - agree with ab CT.  D/w CCM  2. Acute on chronic systolic CHF:  Echocardiogram from [facility] 10/16: LV moderately dilated, severely impaired EF at 10%, moderate RV dysfunction, moderately elevated RVSP.  Nonischemic cardiomyopathy pre-existed this hospitalization (had cath at [facility]).  Suspect cardiomyopathy is due to substance abuse (amphetamines).   - Remains on milrinone 0.125 mcg/kg/min. Coox 71. CVP 7 - Has massive 3rd spacing but holding diuretics and giving free water with serum sodium 153 - Cardiac output adequate now on milrinone, Will continue  - He is not candidate for VAD or transplant with active substance abuse.  3. Acute respiratory failure: Remains intubated and sedated. Suspect PNA. P/CCM managing. Sputum + H influenzae. As below, WBC trending up.  4. Acute CVA - Head CTshowed suspected recent acute to subacute infarct. Neuro consulted. - MRI 10/25 showed acute to early subacute multifocal small RIGHT MCA territory infarcts. MRA showed occluded right  ICA with thready intracranial reconstitution; slow flow RIGHT MCA without emergent cerebral artery occlusion. Recommend CTA head and neck. -Per neuro and primary and goals of care conversation. 5. Polysubstance abuse: UDS + amphetamines and THC. No change.   6. ID: Septic shock.  Has H influenzae in sputum, suspect PNA.  He also appears to have acute cholecystitis on abdominal US.  - Continue meropenem/Vanc - S/p percutaneous cholecystostomy tube 10/20 (per CCM).  - abdominal CT today 7. Transaminitis: Elevated LFTs.  Suspect ischemic hepatitis with shock + acute cholecystitis. Transaminases trending down but tbili rising again, likely in setting of acute cholecystitis. No change.  8. Hypernatremia -continue free water boluses 9. Atrial fibrillation, paroxysmal: Now back in NSR. Will continue amio gtt. Dout this is causing cholestatic picture - INR 1.7. He is not on [facility]. (received vitamin K 10/19 and 10/20 for choley drain)  - Continue amiodarone gtt 30 mg/hr.  10. Acute cholecystitis: - s/p perc drain.  - bili continues to climb. Repeat u/s 10/25. Seemed improved - plan CT today  Prognosis quite grim.    CRITICAL CARE Performed by: Glori Bickers  Total critical care time: 35 minutes  Critical care time was exclusive of separately billable procedures and treating other patients.  Critical care was necessary to treat or prevent imminent or life-threatening deterioration.  Critical care was time spent personally by me (independent of midlevel providers or residents) on the following activities: development of treatment plan with patient and/or surrogate as well as nursing, discussions with consultants, evaluation of patient's response to treatment, examination of patient, obtaining history from patient or surrogate, ordering and performing treatments and interventions, ordering and review of laboratory studies, ordering and review of radiographic studies, pulse oximetry and  re-evaluation of patient's condition.    Length of Stay: [place], MD  09/06/2018, 9:14 AM  Advanced Heart Failure Team Pager [phone] (M-F; 7a - 4p)  Please contact [facility] for night-coverage after hours (4p -7a ) and weekends on [email or web address]

## 2018-09-06 NOTE — Progress Notes (Signed)
Ct Abdomen Pelvis Wo Contrast  Result Date: 09/06/2018 CLINICAL DATA:  Pt is septic, s/p chole drain placed 10/20. Pt has continued to run a fever the last several days. Concern for possible infection. EXAM: CT CHEST, ABDOMEN AND PELVIS WITHOUT CONTRAST TECHNIQUE: Multidetector CT imaging of the chest, abdomen and pelvis was performed following the standard protocol without IV contrast. COMPARISON:  Abdomen ultrasound, 09/05/2018. CTs, 07/12/2014 and 07/05/2014. FINDINGS: CT CHEST FINDINGS Cardiovascular: Heart is mildly enlarged. No pericardial effusion. Left and right coronary artery calcifications. Great vessels are normal in caliber. No aortic atherosclerotic calcifications. Mediastinum/Nodes: No neck base or axillary masses or enlarged lymph nodes. No mediastinal or hilar masses or enlarged lymph nodes. Endotracheal tube tip projects 4 cm above the Carina. Right internal jugular central venous line tip lies at the caval atrial junction. Nasal/orogastric tube passes below the diaphragm into the mid stomach. Lungs/Pleura: Small bilateral pleural effusions. There is dependent opacity in both lower lobes with adjacent ground-glass type opacities. Subtle hazy ground-glass opacity is noted in the posterior right upper lobe and in the posterolateral left upper lobe. There is no interstitial thickening or vascular congestion to suggest pulmonary edema. No lung mass or discrete nodule. No pneumothorax. Musculoskeletal: Developmentally small disks from T3 through T5. No fracture or acute finding. No osteoblastic or osteolytic lesions. CT ABDOMEN PELVIS FINDINGS Hepatobiliary: Liver normal in size with no mass or focal lesion. Gallbladder is been decompressed with a cholecystotomy tube. There is no fluid collection in the gallbladder fossa to suggest a bile leak or abscess. No bile duct dilation. Pancreas: Unremarkable. No pancreatic ductal dilatation or surrounding inflammatory changes. Spleen: Normal in size without  focal abnormality. Adrenals/Urinary Tract: No adrenal masses. Kidneys normal in size, orientation and position. No renal masses, stones or hydronephrosis. Normal ureters. Bladder decompressed with a Foley catheter. Stomach/Bowel: Stomach is unremarkable. Small bowel is normal caliber. No wall thickening or inflammation. Mild distention of the right colon. No colonic wall thickening or inflammation. Mild generalized increased stool burden noted in the colon most evident in the right colon. Appendix not discretely visualized. No evidence of appendicitis. Vascular/Lymphatic: Aortic atherosclerosis. No enlarged abdominal or pelvic lymph nodes. Reproductive: Unremarkable. Other: Minimal ascites noted adjacent to the liver and in the pelvis. Diffuse subcutaneous edema noted along the lower abdomen and pelvis extending to the proximal lower extremities. No abdominal wall hernia. Musculoskeletal: No acute or significant osseous findings. IMPRESSION: CHEST CT 1. There are areas of ground-glass opacity in the lungs. This may be due to mild airspace edema or infection/inflammation. 2. There is dependent opacity in both lower lobes that is most consistent with atelectasis, associated with small effusions. 3. Mild cardiomegaly. ABDOMEN AND PELVIS CT 1. Status post cholecystotomy for decompression. Tube is well positioned. No gallbladder fossa collection to suggest a bile leak or abscess. 2. Minimal amount of ascites noted adjacent to the liver and in the pelvis. This is nonspecific and along with lower abdominal and pelvic soft tissue edema and small pleural effusions suggests a diffuse edematous state. 3. No acute findings within the abdomen or pelvis. 4. Mild generalized increased stool in the colon. No bowel inflammation or obstruction. Electronically Signed   By: Amie Portland M.D.   On: 09/06/2018 14:33   Ct Chest Wo Contrast  Result Date: 09/06/2018 CLINICAL DATA:  Pt is septic, s/p chole drain placed 10/20. Pt has  continued to run a fever the last several days. Concern for possible infection. EXAM: CT CHEST, ABDOMEN AND PELVIS WITHOUT CONTRAST  TECHNIQUE: Multidetector CT imaging of the chest, abdomen and pelvis was performed following the standard protocol without IV contrast. COMPARISON:  Abdomen ultrasound, 09/05/2018. CTs, 07/12/2014 and 07/05/2014. FINDINGS: CT CHEST FINDINGS Cardiovascular: Heart is mildly enlarged. No pericardial effusion. Left and right coronary artery calcifications. Great vessels are normal in caliber. No aortic atherosclerotic calcifications. Mediastinum/Nodes: No neck base or axillary masses or enlarged lymph nodes. No mediastinal or hilar masses or enlarged lymph nodes. Endotracheal tube tip projects 4 cm above the Carina. Right internal jugular central venous line tip lies at the caval atrial junction. Nasal/orogastric tube passes below the diaphragm into the mid stomach. Lungs/Pleura: Small bilateral pleural effusions. There is dependent opacity in both lower lobes with adjacent ground-glass type opacities. Subtle hazy ground-glass opacity is noted in the posterior right upper lobe and in the posterolateral left upper lobe. There is no interstitial thickening or vascular congestion to suggest pulmonary edema. No lung mass or discrete nodule. No pneumothorax. Musculoskeletal: Developmentally small disks from T3 through T5. No fracture or acute finding. No osteoblastic or osteolytic lesions. CT ABDOMEN PELVIS FINDINGS Hepatobiliary: Liver normal in size with no mass or focal lesion. Gallbladder is been decompressed with a cholecystotomy tube. There is no fluid collection in the gallbladder fossa to suggest a bile leak or abscess. No bile duct dilation. Pancreas: Unremarkable. No pancreatic ductal dilatation or surrounding inflammatory changes. Spleen: Normal in size without focal abnormality. Adrenals/Urinary Tract: No adrenal masses. Kidneys normal in size, orientation and position. No renal  masses, stones or hydronephrosis. Normal ureters. Bladder decompressed with a Foley catheter. Stomach/Bowel: Stomach is unremarkable. Small bowel is normal caliber. No wall thickening or inflammation. Mild distention of the right colon. No colonic wall thickening or inflammation. Mild generalized increased stool burden noted in the colon most evident in the right colon. Appendix not discretely visualized. No evidence of appendicitis. Vascular/Lymphatic: Aortic atherosclerosis. No enlarged abdominal or pelvic lymph nodes. Reproductive: Unremarkable. Other: Minimal ascites noted adjacent to the liver and in the pelvis. Diffuse subcutaneous edema noted along the lower abdomen and pelvis extending to the proximal lower extremities. No abdominal wall hernia. Musculoskeletal: No acute or significant osseous findings. IMPRESSION: CHEST CT 1. There are areas of ground-glass opacity in the lungs. This may be due to mild airspace edema or infection/inflammation. 2. There is dependent opacity in both lower lobes that is most consistent with atelectasis, associated with small effusions. 3. Mild cardiomegaly. ABDOMEN AND PELVIS CT 1. Status post cholecystotomy for decompression. Tube is well positioned. No gallbladder fossa collection to suggest a bile leak or abscess. 2. Minimal amount of ascites noted adjacent to the liver and in the pelvis. This is nonspecific and along with lower abdominal and pelvic soft tissue edema and small pleural effusions suggests a diffuse edematous state. 3. No acute findings within the abdomen or pelvis. 4. Mild generalized increased stool in the colon. No bowel inflammation or obstruction. Electronically Signed   By: Amie Portland M.D.   On: 09/06/2018 14:33     Plan Senna docustate for stoool     SIGNATURE    Dr. Kalman Shan, M.D., F.C.C.P,  Pulmonary and Critical Care Medicine Staff Physician, [facility] Director - Interstitial Lung Disease  Program   Pulmonary Fibrosis [facility] at [facility] [place], [place], [address]  Pager: [phone], If no answer or between  15:00h - 7:00h: call [phone] Telephone: [phone]  3:50 PM 09/06/2018

## 2018-09-06 NOTE — Progress Notes (Signed)
Chaplain rec'd call from [facility] suggesting a visit.  Pt's brother was bedside and crying. Pt's brother just learned that his brother was here. Chaplain offered ministry of presence and prayer.  Suggested that the pt's brother make a request of the spiritual care office to have lodging as [place] and homeless with no resources.  Pt is being suggested for palliative care, and situation critical.  Will follow. Lynnell Chad Pager [phone]

## 2018-09-06 NOTE — Progress Notes (Signed)
Dr. Marchelle Gearing notified of patient's HR and ectopy with frequent bigeminy and trigeminy.  I-stat chem 8 and magnesium level performed.  Potasssium =4.6.  No other orders received.

## 2018-09-06 NOTE — Progress Notes (Signed)
Neurologist at bedside to perform neuro exam.  Patient was able to move right foot and toes and move right hand and fingers on command.

## 2018-09-06 NOTE — Progress Notes (Signed)
CRITICAL VALUE ALERT  Critical Value:  21.3 total bilirubin  Date & Time Notied:  09/06/2018, 1730  Provider Notified: Elink   Orders Received/Actions taken: no new orders given.

## 2018-09-06 NOTE — Progress Notes (Signed)
Patient taken to CT scan via bed accompanied by SWAT RN, Morrie Sheldon, RRT, and CT transporter.  Patient in NAD.  VSS.  Brief report given to SWAT prior to transport.

## 2018-09-06 NOTE — Progress Notes (Signed)
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 Presented to [facility] with increasing dyspnea. Acute decompensation with hypotension and severe respiratory distress requiring intubation. Transferred to [facility] for management of sepsis, advanced heart failure.  Growing H influenza in sputum.  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  Past Medical History  Known cardiomyopathy and drug abuse (THC and methamphetamines)  Significant Hospital Events   10/16- Intubated at [facility] 10/17- Worsening shock, maxed on pressors, right heart cath indicative of distributive shock, high output failure 10/18-Started on milrinone, lasix for reduced cardiac output. PA numbers show worsening CO and high wedge 10/19- Started making urine, pressors weaning down, continues on milrinone. Surgery consulted for cholecystitis 10/20- Mucus plug overnight requiring bag, lavage. Went into afib.  Perc chole drain placed by IR 10/23 Start SBT trials, failed due to tachypnea 10/24 [facility] type respirations > CT head which showed stroke  Consults: date of consult/date signed off & final recs:  10/17- Cardiology  10/19- Surgery signed on 09/02/2017 10/24 Neurology  Procedures (surgical and bedside):  10/16 [facility] R subclavian CVC 10/16 [facility] L femoral arterial line 10/17 PA catheter 10/20 Perc chole drain  Significant Diagnostic Tests:  Echocardiogram 10/17- Mild to moderate LV dilation with EF 20%, diffuse hypokinesis.  Restrictive diastolic function.  Mildly dilated RV with mildly decreased systolic function. No significant valvular abnormalities Abdominal ultrasound 08/29/2018- severe gallbladder thickening, sludge, cholelithiasis, small volume of perihepatic, pericholecystic and perinephric free fluid. UDS [facility] 10/16- Amphetamines, THC [place] 10/18 Initial numbers RA 10 PA 46/33 (41) PCWP 10 Thermo CO/CI  15.2/7.0 Fick 14.1/6.5 (co-ox 86%) SVR 231 CT head 10/24 > small area of hypoattenuation in right parietal lobe suspicious for recent acute to subacute infarct. 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  Occluded R ICA, slow flow R MCA.  Micro Data:  Bcx 10/17 >> ng Trach aspirate 10/17- H influenza  Antimicrobials:  Vancomycin 10/17 > 10/21. 10/24 >  Meropenen 10/17 > Aztreonam 10/17>> off  SUBJECTIVE/OVERNIGHT/INTERVAL HX    10/26 - spiking fevers 103 - swinging fevers since 09/03/18. Rising bilirubin. Rpt [place] c/w Acute choley - per IR yesterday - GB drain working well. CCS suspecting this is cholestasis; not a candidate for surgery. Total Bili > IndirectBili. PCT at 5 and suggestive of septic etiology  Remains on amio gtt, milrinone, - cards managing  Of levophed - SBP naturally 130 (on this for stroke)  On fent gtt - wua in progress  Objective   Blood pressure (!) 119/94, pulse (!) 107, temperature 100 F (37.8 C), resp. rate 14, height 6' (1.829 m), weight 105.7 kg, SpO2 99 %. CVP:  [4 mmHg-6 mmHg] 4 mmHg  Vent Mode: PRVC FiO2 (%):  [40 %] 40 % Set Rate:  [14 bmp] 14 bmp Vt Set:  [540 mL] 540 mL PEEP:  [5 cmH20] 5 cmH20 Plateau Pressure:  [13 cmH20-21 cmH20] 21 cmH20   Intake/Output Summary (Last 24 hours) at 09/06/2018 0758 Last data filed at 09/06/2018 0500 Gross per 24 hour  Intake  2434.08 ml  Output 2580 ml  Net -145.92 ml   Filed Weights   09/05/18 0500 09/05/18 0800 09/06/18 0500  Weight: 102.6 kg 100.7 kg  105.7 kg   Examination: General Appearance:  Looks criticall ill OBESE - NO. JAUNDICED++. VOLUME OVERLOADED + Head:  Normocephalic, without obvious abnormality, atraumatic Eyes:  PERRL - yes, conjunctiva/corneas - JAUNDICED     Ears:  Normal external ear canals, both ears Nose:  G tube - no Throat:  ETT TUBE - yes , OG tube - yes Neck:  Supple,  No enlargement/tenderness/nodules Lungs: Clear to auscultation bilaterally, Ventilator   Synchrony - yes Heart:  S1 and S2 normal, no murmur, CVP - x.  Pressors - On milrinone Abdomen:  Soft, no masses, no organomegaly Genitalia / Rectal:  Not done Extremities:  Extremities- intact, but has edem and jaundiced Skin:  ntact in exposed areas . Sacral area - x Neurologic:  Sedation - fent gtt -> RASS - -3     LABS    PULMONARY Recent Labs  Lab 08/31/18 0428  09/01/18 0408  09/04/18 0340 09/04/18 0513 09/05/18 0313 09/05/18 0350 09/06/18 0450  PHART 7.560*  --  7.440  --   --  7.503*  --  7.478*  --   PCO2ART 35.9  --  45.9  --   --  41.5  --  41.6  --   PO2ART 153.0*  --  130.0*  --   --  266.0*  --  160.0*  --   HCO3 32.2*  --  31.3*  --   --  32.3*  --  30.8*  --   TCO2 33*  --  33*  --   --  33*  --  32  --   O2SAT 100.0   < > 99.0   < > 69.8 100.0 59.3 100.0 71.0   < > = values in this interval not displayed.    CBC Recent Labs  Lab 09/04/18 0332 09/05/18 0227 09/06/18 0435  HGB 14.7 15.2 16.6  HCT 44.8 45.8 53.0*  WBC 14.6* 18.0* 23.1*  PLT 92* 121* 99*    COAGULATION Recent Labs  Lab 09/02/18 0409 09/03/18 0322 09/04/18 0332 09/05/18 0227 09/06/18 0435  INR 2.10 1.57 2.01 1.87 1.66    CARDIAC  No results for input(s): TROPONINI in the last 168 hours. No results for input(s): PROBNP in the last 168 hours.   CHEMISTRY Recent Labs  Lab 09/01/18 0356  09/02/18 0409 09/03/18 0322 09/04/18 0332 09/05/18 0227 09/06/18 0435  NA 138   < > 142 146* 151* 154* 153*  K 3.6   < > 4.3 4.1 3.7 4.3 4.6  CL  103   < > 106 108 114* 117* 113*  CO2 29   < > 28 32 30 32 31  GLUCOSE 138*   < > 149* 164* 124* 126* 114*  BUN 71*   < > 67* 72* 81* 87* 90*  CREATININE 1.92*   < > 1.70* 1.67* 1.83* 1.58* 1.61*  CALCIUM 7.8*   < > 8.1* 8.2* 7.8* 7.8* 8.4*  MG 2.1   < > 2.0 2.2 2.5* 2.4 2.6*  PHOS 4.3  --  4.1 5.1* 4.2  --  5.9*   < > = values in this interval not displayed.   Estimated Creatinine Clearance: 76.6 mL/min (A) (by C-G formula based on SCr of 1.61 mg/dL (H)).   LIVER Recent Labs  Lab 09/02/18 0409 09/03/18 0322 09/04/18 0332 09/05/18 0227 09/05/18  1411 09/06/18 0435  AST 125* 93* 105* 100*  --  126*  ALT 1,403* 1,045* 593* 393*  --  337*  ALKPHOS 108 108 88 83  --  91  BILITOT 9.2* 8.6* 11.9* 14.3* 11.8* 21.3*  PROT 5.4* 5.6* 5.2* 5.2*  --  6.2*  ALBUMIN 2.2* 2.2* 2.0* 1.9*  --  2.1*  INR 2.10 1.57 2.01 1.87  --  1.66     INFECTIOUS Recent Labs  Lab 09/01/18 0356 09/04/18 0736 09/04/18 1204 09/05/18 0227 09/06/18 0435  LATICACIDVEN 1.5 1.7  --   --  1.7  PROCALCITON  --   --  2.43 4.82 5.06     ENDOCRINE CBG (last 3)  Recent Labs    09/05/18 2352 09/06/18 0403 09/06/18 0731  GLUCAP 96 107* 87         IMAGING x48h  - image(s) personally visualized  -   highlighted in bold Ct Head Wo Contrast  Result Date: 09/04/2018 CLINICAL DATA:  42 y/o  M; decreased responsiveness, encephalopathy. EXAM: CT HEAD WITHOUT CONTRAST TECHNIQUE: Contiguous axial images were obtained from the base of the skull through the vertex without intravenous contrast. COMPARISON:  10/13/2015 CT head. FINDINGS: Brain: Small area of hypoattenuation with focal loss of gray-white differentiation in the right parietal lobe. No associated hemorrhage or mass effect. No extra-axial collection, hydrocephalus, or herniation. Vascular: Calcific atherosclerosis of carotid siphons. No hyperdense vessel identified. Skull: Normal. Negative for fracture or focal lesion. Sinuses/Orbits: No acute finding.  Other: None. IMPRESSION: Small area of hypoattenuation with focal loss of gray-white differentiation in the right parietal lobe, suspected recent acute to subacute infarction. No associated hemorrhage or mass effect. These results will be called to the ordering clinician or representative by the Radiologist Assistant, and communication documented in the PACS or zVision Dashboard. Electronically Signed   By: Mitzi Hansen M.D.   On: 09/04/2018 15:18   Mr Maxine Glenn Head Wo Contrast  Result Date: 09/05/2018 CLINICAL DATA:  Altered mental status, follow-up infarct. History of polysubstance abuse, cardiomyopathy. EXAM: MRI HEAD WITHOUT CONTRAST MRA HEAD WITHOUT CONTRAST TECHNIQUE: Multiplanar, multiecho pulse sequences of the brain and surrounding structures were obtained without intravenous contrast. Angiographic images of the head were obtained using MRA technique without contrast. COMPARISON:  CT HEAD September 04, 2018 FINDINGS: MRI HEAD FINDINGS INTRACRANIAL CONTENTS: Patchy reduced diffusion RIGHT parietal lobe, with subcentimeter foci reduced diffusion RIGHT frontal and temporal lobes, areas of diffusion abnormality demonstrate faintly decreased ADC values. No susceptibility artifact to suggest hemorrhage or septic emboli. A few scattered subcentimeter supratentorial white matter FLAIR T2 hyperintensities exclusive aforementioned abnormality. No midline shift, mass effect or masses. No abnormal extra-axial fluid collections. Borderline parenchymal brain volume loss for age. No hydrocephalus. VASCULAR: T2 bright signal RIGHT ICA. SKULL AND UPPER CERVICAL SPINE: No abnormal sellar expansion. No suspicious calvarial bone marrow signal. Craniocervical junction maintained. SINUSES/ORBITS: Bilateral mastoid effusions. Paranasal sinuses are well aerated. Secretions layering in the nasopharynx. Included ocular globes and orbital contents are non-suspicious. OTHER: Patient is edentulous.  Life support lines in  place. MRA HEAD FINDINGS ANTERIOR CIRCULATION: Loss of RIGHT cervical internal carotid artery flow related enhancement, thready within RIGHT petrous to supraclinoid segments. Patent LEFT internal carotid artery. Patent anterior communicating artery and anterior cerebral arteries. Patent though slow flow RIGHT middle cerebral artery. Normal LEFT MCA. No flow limiting stenosis, aneurysm. POSTERIOR CIRCULATION: Codominant vertebral arteries. Vertebrobasilar arteries are patent, with normal flow related enhancement of the main branch vessels. Patent posterior cerebral arteries. No large  vessel occlusion, flow limiting stenosis,  aneurysm. ANATOMIC VARIANTS: None. Source images and MIP images were reviewed. IMPRESSION: MRI HEAD: 1. 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 2. Borderline parenchymal brain volume loss for age. 3. Minimal chronic small vessel ischemic changes. MRA HEAD: 1. 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 Recommend CTA head and neck. These results will be called to the ordering clinician or representative by the Radiologist Assistant, and communication documented in the PACS or zVision Dashboard. Electronically Signed   By: Awilda Metro M.D.   On: 09/05/2018 06:30   Mr Brain Wo Contrast  Result Date: 09/05/2018 CLINICAL DATA:  Altered mental status, follow-up infarct. History of polysubstance abuse, cardiomyopathy. EXAM: MRI HEAD WITHOUT CONTRAST MRA HEAD WITHOUT CONTRAST TECHNIQUE: Multiplanar, multiecho pulse sequences of the brain and surrounding structures were obtained without intravenous contrast. Angiographic images of the head were obtained using MRA technique without contrast. COMPARISON:  CT HEAD September 04, 2018 FINDINGS: MRI HEAD FINDINGS INTRACRANIAL CONTENTS: Patchy reduced diffusion RIGHT parietal lobe, with subcentimeter foci reduced diffusion RIGHT frontal and  temporal lobes, areas of diffusion abnormality demonstrate faintly decreased ADC values. No susceptibility artifact to suggest hemorrhage or septic emboli. A few scattered subcentimeter supratentorial white matter FLAIR T2 hyperintensities exclusive aforementioned abnormality. No midline shift, mass effect or masses. No abnormal extra-axial fluid collections. Borderline parenchymal brain volume loss for age. No hydrocephalus. VASCULAR: T2 bright signal RIGHT ICA. SKULL AND UPPER CERVICAL SPINE: No abnormal sellar expansion. No suspicious calvarial bone marrow signal. Craniocervical junction maintained. SINUSES/ORBITS: Bilateral mastoid effusions. Paranasal sinuses are well aerated. Secretions layering in the nasopharynx. Included ocular globes and orbital contents are non-suspicious. OTHER: Patient is edentulous.  Life support lines in place. MRA HEAD FINDINGS ANTERIOR CIRCULATION: Loss of RIGHT cervical internal carotid artery flow related enhancement, thready within RIGHT petrous to supraclinoid segments. Patent LEFT internal carotid artery. Patent anterior communicating artery and anterior cerebral arteries. Patent though slow flow RIGHT middle cerebral artery. Normal LEFT MCA. No flow limiting stenosis, aneurysm. POSTERIOR CIRCULATION: Codominant vertebral arteries. Vertebrobasilar arteries are patent, with normal flow related enhancement of the main branch vessels. Patent posterior cerebral arteries. No large vessel occlusion, flow limiting stenosis,  aneurysm. ANATOMIC VARIANTS: None. Source images and MIP images were reviewed. IMPRESSION: MRI HEAD: 1. 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 2. Borderline parenchymal brain volume loss for age. 3. Minimal chronic small vessel ischemic changes. MRA HEAD: 1. 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 Recommend CTA head and neck. These results will be  called to the ordering clinician or representative by the Radiologist Assistant, and communication documented in the PACS or zVision Dashboard. Electronically Signed   By: Awilda Metro M.D.   On: 09/05/2018 06:30   Dg Chest Port 1 View  Result Date: 09/05/2018 CLINICAL DATA:  42 year old male with a history of endotracheal tube placement EXAM: PORTABLE CHEST 1 VIEW COMPARISON:  09/04/2018 FINDINGS: Cardiomediastinal silhouette unchanged. Improved aeration compared to the prior plain film with persistently low lung volumes. No pneumothorax. No large pleural effusion. Similar appearance of coarsened interstitial markings. Endotracheal tube terminates suitably above the carina approximately 5 cm. Unchanged right subclavian central catheter terminating in the region of the superior cavoatrial junction. Unchanged right IJ sheath. Unchanged gastric tube terminating out of the field of view. IMPRESSION: Improved aeration at the comparison plain film, with evidence of  mild edema and/or atelectasis. Endotracheal tube terminates suitably above the carina. Unchanged right subclavian central catheter, right IJ sheath, and gastric tube Electronically Signed   By: Gilmer Mor D.O.   On: 09/05/2018 11:00   US Abdomen Limited Ruq  Result Date: 09/05/2018 CLINICAL DATA:  Acute cholecystitis. Status post cholecystostomy placement. EXAM: ULTRASOUND ABDOMEN LIMITED RIGHT UPPER QUADRANT COMPARISON:  Ultrasound of August 29, 2018. FINDINGS: Gallbladder: Cholelithiasis is noted with mild gallbladder wall thickening measuring 4 mm. Gallbladder is decompressed secondary to drainage catheter placement. No sonographic Murphy's sign is noted. Common bile duct: Diameter: 2.6 mm which is within normal limits. Liver: No focal lesion identified. Normal echogenicity of hepatic parenchyma is noted. Portal vein is patent on color Doppler imaging with normal direction of blood flow towards the liver. IMPRESSION: Cholelithiasis with  mild gallbladder wall thickening is noted. Gallbladder is decompressed secondary to cholecystostomy placement. Electronically Signed   By: Lupita Raider, M.D.   On: 09/05/2018 15:08     Recent Results (from the past 240 hour(s))  MRSA PCR Screening     Status: Abnormal   Collection Time: 09/08/2018  7:36 PM  Result Value Ref Range Status   MRSA by PCR POSITIVE (A) NEGATIVE Final    Comment:        The GeneXpert MRSA Assay (FDA approved for NASAL specimens only), is one component of a comprehensive MRSA colonization surveillance program. It is not intended to diagnose MRSA infection nor to guide or monitor treatment for MRSA infections. RESULT CALLED TO, READ BACK BY AND VERIFIED WITH: S.PITTMAN,RN AT 2223 BY L.PITT 08/22/2018   Culture, blood (Routine X 2) w Reflex to ID Panel     Status: None   Collection Time: 09/05/2018  5:51 AM  Result Value Ref Range Status   Specimen Description BLOOD LEFT ANTECUBITAL  Final   Special Requests   Final    BOTTLES DRAWN AEROBIC AND ANAEROBIC Blood Culture adequate volume   Culture   Final    NO GROWTH 5 DAYS Performed at [facility], [address], [place], [place] [address]    Report Status 09/02/2018 FINAL  Final  Culture, blood (Routine X 2) w Reflex to ID Panel     Status: None   Collection Time: 08/15/2018  6:03 AM  Result Value Ref Range Status   Specimen Description BLOOD RIGHT ANTECUBITAL  Final   Special Requests   Final    BOTTLES DRAWN AEROBIC AND ANAEROBIC Blood Culture adequate volume   Culture   Final    NO GROWTH 5 DAYS Performed at [facility], [address] [place], [place] [address]    Report Status 09/02/2018 FINAL  Final  Culture, respiratory     Status: None   Collection Time: 08/16/2018 10:33 AM  Result Value Ref Range Status   Specimen Description TRACHEAL ASPIRATE  Final   Special Requests NONE  Final   Gram Stain   Final    MODERATE WBC PRESENT, PREDOMINANTLY PMN MODERATE GRAM NEGATIVE  COCCOBACILLI RARE GRAM POSITIVE COCCI    Culture   Final    MODERATE HAEMOPHILUS INFLUENZAE BETA LACTAMASE POSITIVE Performed at [facility], [address] [place], [place] [address]    Report Status 08/30/2018 FINAL  Final  Culture, Urine     Status: None   Collection Time: 08/29/18  8:41 AM  Result Value Ref Range Status   Specimen Description URINE, CATHETERIZED  Final   Special Requests NONE  Final   Culture   Final    NO GROWTH Performed at [facility], [address] [place], [place] [address]    Report Status 08/30/2018 FINAL  Final  Aerobic/Anaerobic Culture (surgical/deep wound)     Status: None   Collection Time: 08/31/18 10:48 AM  Result Value Ref Range Status   Specimen Description BILE  Final   Special Requests NONE  Final   Gram Stain   Final    DEGENERATED CELLULAR MATERIAL PRESENT NO ORGANISMS SEEN    Culture   Final    NO ANAEROBES ISOLATED Performed at [facility], [address], [place], [place] [address]    Report Status 09/05/2018 FINAL  Final  Culture, blood (Routine X 2) w Reflex to ID Panel     Status: None (Preliminary result)   Collection Time: 09/03/18  9:40 AM  Result Value Ref Range Status   Specimen Description BLOOD RIGHT HAND  Final   Special Requests   Final    BOTTLES DRAWN AEROBIC ONLY Blood Culture adequate volume   Culture   Final    NO GROWTH 2 DAYS Performed at [facility], [address], [place], [place] [address]    Report Status PENDING  Incomplete  Culture, blood (Routine X 2) w Reflex to ID Panel     Status: None (Preliminary result)   Collection Time: 09/03/18  9:47 AM  Result Value Ref Range Status   Specimen Description BLOOD LEFT HAND  Final   Special Requests   Final    BOTTLES DRAWN AEROBIC ONLY Blood Culture adequate volume   Culture   Final    NO GROWTH 2 DAYS Performed at [facility], [address], [place], [place] [address]    Report Status PENDING  Incomplete    Culture, respiratory (non-expectorated)     Status: None (Preliminary result)   Collection Time: 09/05/18 12:57 PM  Result Value Ref Range Status   Specimen Description TRACHEAL ASPIRATE  Final   Special Requests NONE  Final   Gram Stain   Final    RARE WBC PRESENT, PREDOMINANTLY PMN RARE GRAM POSITIVE COCCI RARE YEAST Performed at [facility], [address], [place], [place] [address]    Culture PENDING  Incomplete   Report Status PENDING  Incomplete     Assessment & Plan:   #Shock - Cardiogenic (AoC sCHF with cardiomyopathy and EF 10%), septic combination.  Shock physiology resolved and has been off all pressors up until evening of 10/24 when required low dose norepi to support BP given new findings of MCA stroke. Severe cardiomyopathy, noncompliant. V tach, A. Fib (resolved with amiodarone).  10/26 - HR 106 and sinus  PLAN - per cards; amio gtt and milrionone Gtt and LVAD due to substance abuse hx. Diuresis decision by cards - SBP > 110 (for stroke, not for sepsis/shock) -  ? Can come off amio gtt  #Acute respiratory failure secondary to pulmonary edema and H. influenzae pneumonia. - Has not tolerated weaning thus far due to agitation and tachypnea (stroke likely playing a role too).  - 09/06/2018 - > does not meet criteria for SBT/Extubation in setting of Acute Respiratory Failure due to circulatory issues, stroke, encephalopathy    #Severe sepsis-H. influenzae pneumonia, +/-  Acute cholecystitis.  10/26- having swinging fevers with persistent high PCt with uptrend. Per CCS and IR - gb drain functoning well. Has worsening jaundice. [place] 10/25 - repeat pattern  PLAN - get Ct chest and CT abdomen - based on results consider GI v ID consult v both - Continue merrem and vanc,    #Acute metabolic encephalopathy-related to sedation + ICU delirium + new stroke.  09/06/18 - off precedex. On fent gtt. Ongoing encephalopathy + PLAN - fent gtt - RASS goal 0 to  -2  #Right MCA stroke. - Neurology following. - Levophed as needed to maintain SBP > 110.  #Renal failure.  10/26 - improved PLAN - Continue to follow urine output and creatinine. - Diuresis as able.  Had good UOP with 40mg  x 2 doses 10/24; however, Na increased slightly.  Will hold off today.  #Hypernatremia. - Continue free water per tube, increase from 200 to 400 q8hrs.  #Elevated LFTs,Acute cholecystitis - s/p perc drain 10/20.  10/26 - worsenign biliriubin. Per IR and CCCS drain working well. Jaundiced reported with merrem < 1%  PLAN - Continue drain x 4 - 6 weeks then can determine if surgery will be necessary. - check pan CT; might need GI consult   Disposition / Summary of Today's Plan 09/06/18   Continue abx. Get Pan CT and decide on ID v GI consult for worsening jaundice Palliative care consulted for goals of care discussions.  Best Practice    Code Status: full Family Communication:  09/06/2018 - girlfried at bedside sleeping; not updated    ATTESTATION & SIGNATURE   The patient Cody Patterson is critically ill with multiple organ systems failure and requires high complexity decision making for assessment and support, frequent evaluation and titration of therapies, application of advanced monitoring technologies and extensive interpretation of multiple databases.   Critical Care Time devoted to patient care services described in this note is  30  Minutes. This time reflects time of care of this signee Dr Kalman Shan. This critical care time does not reflect procedure time, or teaching time or supervisory time of PA/NP/Med student/Med Resident etc but could involve care discussion time     Dr. Kalman Shan, M.D., [facility].C.P Pulmonary and Critical Care Medicine Staff Physician [facility] [place] [facility] Pager: [phone], If no answer or between  15:00h - 7:00h: call [phone]  09/06/2018 7:58 AM

## 2018-09-06 NOTE — Progress Notes (Signed)
STROKE TEAM PROGRESS NOTE   SUBJECTIVE (INTERVAL HISTORY) His girlfriend and RN are at the bedside. Pt still intubated on precedex. Had MRI and MRA showed right MCA small infarct but right ICA occlusion vs. High grade stenosis.  He is improving, he is following commands such as squeeze fingers  And wiggle toes on the right.    OBJECTIVE Temp:  [98.4 F (36.9 C)-103.1 F (39.5 C)] 98.4 F (36.9 C) (10/26 1300) Pulse Rate:  [80-117] 99 (10/26 1330) Cardiac Rhythm: Sinus tachycardia (10/26 0900) Resp:  [14-25] 14 (10/26 1330) BP: (105-149)/(75-122) 122/101 (10/26 1330) SpO2:  [97 %-100 %] 98 % (10/26 1330) FiO2 (%):  [40 %] 40 % (10/26 1122) Weight:  [105.7 kg] 105.7 kg (10/26 0500)  Recent Labs  Lab 09/05/18 1923 09/05/18 2352 09/06/18 0403 09/06/18 0731 09/06/18 1144  GLUCAP 104* 96 107* 87 111*   Recent Labs  Lab 09/01/18 0356  09/02/18 0409 09/03/18 0322 09/04/18 0332 09/05/18 0227 09/06/18 0435  NA 138   < > 142 146* 151* 154* 153*  K 3.6   < > 4.3 4.1 3.7 4.3 4.6  CL 103   < > 106 108 114* 117* 113*  CO2 29   < > 28 32 30 32 31  GLUCOSE 138*   < > 149* 164* 124* 126* 114*  BUN 71*   < > 67* 72* 81* 87* 90*  CREATININE 1.92*   < > 1.70* 1.67* 1.83* 1.58* 1.61*  CALCIUM 7.8*   < > 8.1* 8.2* 7.8* 7.8* 8.4*  MG 2.1   < > 2.0 2.2 2.5* 2.4 2.6*  PHOS 4.3  --  4.1 5.1* 4.2  --  5.9*   < > = values in this interval not displayed.   Recent Labs  Lab 09/02/18 0409 09/03/18 0322 09/04/18 0332 09/05/18 0227 09/05/18 1411 09/06/18 0435  AST 125* 93* 105* 100*  --  126*  ALT 1,403* 1,045* 593* 393*  --  337*  ALKPHOS 108 108 88 83  --  91  BILITOT 9.2* 8.6* 11.9* 14.3* 11.8* 21.3*  PROT 5.4* 5.6* 5.2* 5.2*  --  6.2*  ALBUMIN 2.2* 2.2* 2.0* 1.9*  --  2.1*   Recent Labs  Lab 09/02/18 0409 09/03/18 0322 09/04/18 0332 09/05/18 0227 09/06/18 0435  WBC 16.6* 17.9* 14.6* 18.0* 23.1*  NEUTROABS  --  15.0*  --   --   --   HGB 13.8 14.7 14.7 15.2 16.6  HCT 41.3  45.0 44.8 45.8 53.0*  MCV 83.6 85.2 84.1 86.3 87.9  PLT 108* 131* 92* 121* 99*   No results for input(s): CKTOTAL, CKMB, CKMBINDEX, TROPONINI in the last 168 hours. Recent Labs    09/04/18 0332 09/05/18 0227 09/06/18 0435  LABPROT 22.5* 21.3* 19.4*  INR 2.01 1.87 1.66   No results for input(s): COLORURINE, LABSPEC, PHURINE, GLUCOSEU, HGBUR, BILIRUBINUR, KETONESUR, PROTEINUR, UROBILINOGEN, NITRITE, LEUKOCYTESUR in the last 72 hours.  Invalid input(s): APPERANCEUR     Component Value Date/Time   CHOL 125 09/05/2018 0227   TRIG 208 (H) 09/05/2018 0227   HDL <10 (L) 09/05/2018 0227   CHOLHDL NOT CALCULATED 09/05/2018 0227   VLDL 42 (H) 09/05/2018 0227   LDLCALC NOT CALCULATED 09/05/2018 0227   Lab Results  Component Value Date   HGBA1C 6.2 (H) 09/05/2018   No results found for: LABOPIA, COCAINSCRNUR, LABBENZ, AMPHETMU, THCU, LABBARB  No results for input(s): ETH in the last 168 hours.  I have personally reviewed the radiological images below and  agree with the radiology interpretations.  Dg Abd 1 View  Result Date: 09/15/18 CLINICAL DATA:  Abdominal distention. EXAM: ABDOMEN - 1 VIEW COMPARISON:  None. FINDINGS: NG tube is in the mid stomach. Moderate gas and stool within the colon. No evidence of bowel obstruction. No organomegaly or visible free air. IMPRESSION: NG tube tip in the mid stomach. Moderate gas and stool within the colon. Electronically Signed   By: Charlett Nose M.D.   On: 09/15/18 08:48   Ct Head Wo Contrast  Result Date: 09/04/2018 CLINICAL DATA:  42 y/o  M; decreased responsiveness, encephalopathy. EXAM: CT HEAD WITHOUT CONTRAST TECHNIQUE: Contiguous axial images were obtained from the base of the skull through the vertex without intravenous contrast. COMPARISON:  10/13/2015 CT head. FINDINGS: Brain: Small area of hypoattenuation with focal loss of gray-white differentiation in the right parietal lobe. No associated hemorrhage or mass effect. No extra-axial  collection, hydrocephalus, or herniation. Vascular: Calcific atherosclerosis of carotid siphons. No hyperdense vessel identified. Skull: Normal. Negative for fracture or focal lesion. Sinuses/Orbits: No acute finding. Other: None. IMPRESSION: Small area of hypoattenuation with focal loss of gray-white differentiation in the right parietal lobe, suspected recent acute to subacute infarction. No associated hemorrhage or mass effect. These results will be called to the ordering clinician or representative by the Radiologist Assistant, and communication documented in the PACS or zVision Dashboard. Electronically Signed   By: Mitzi Hansen M.D.   On: 09/04/2018 15:18   Mr Maxine Glenn Head Wo Contrast  Result Date: 09/05/2018 CLINICAL DATA:  Altered mental status, follow-up infarct. History of polysubstance abuse, cardiomyopathy. EXAM: MRI HEAD WITHOUT CONTRAST MRA HEAD WITHOUT CONTRAST TECHNIQUE: Multiplanar, multiecho pulse sequences of the brain and surrounding structures were obtained without intravenous contrast. Angiographic images of the head were obtained using MRA technique without contrast. COMPARISON:  CT HEAD September 04, 2018 FINDINGS: MRI HEAD FINDINGS INTRACRANIAL CONTENTS: Patchy reduced diffusion RIGHT parietal lobe, with subcentimeter foci reduced diffusion RIGHT frontal and temporal lobes, areas of diffusion abnormality demonstrate faintly decreased ADC values. No susceptibility artifact to suggest hemorrhage or septic emboli. A few scattered subcentimeter supratentorial white matter FLAIR T2 hyperintensities exclusive aforementioned abnormality. No midline shift, mass effect or masses. No abnormal extra-axial fluid collections. Borderline parenchymal brain volume loss for age. No hydrocephalus. VASCULAR: T2 bright signal RIGHT ICA. SKULL AND UPPER CERVICAL SPINE: No abnormal sellar expansion. No suspicious calvarial bone marrow signal. Craniocervical junction maintained. SINUSES/ORBITS:  Bilateral mastoid effusions. Paranasal sinuses are well aerated. Secretions layering in the nasopharynx. Included ocular globes and orbital contents are non-suspicious. OTHER: Patient is edentulous.  Life support lines in place. MRA HEAD FINDINGS ANTERIOR CIRCULATION: Loss of RIGHT cervical internal carotid artery flow related enhancement, thready within RIGHT petrous to supraclinoid segments. Patent LEFT internal carotid artery. Patent anterior communicating artery and anterior cerebral arteries. Patent though slow flow RIGHT middle cerebral artery. Normal LEFT MCA. No flow limiting stenosis, aneurysm. POSTERIOR CIRCULATION: Codominant vertebral arteries. Vertebrobasilar arteries are patent, with normal flow related enhancement of the main branch vessels. Patent posterior cerebral arteries. No large vessel occlusion, flow limiting stenosis,  aneurysm. ANATOMIC VARIANTS: None. Source images and MIP images were reviewed. IMPRESSION: MRI HEAD: 1. 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 2. Borderline parenchymal brain volume loss for age. 3. Minimal chronic small vessel ischemic changes. MRA HEAD: 1. 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 Recommend CTA head and neck. These results will be called  to the ordering clinician or representative by the Radiologist Assistant, and communication documented in the PACS or zVision Dashboard. Electronically Signed   By: Awilda Metro M.D.   On: 09/05/2018 06:30   Mr Brain Wo Contrast  Result Date: 09/05/2018 CLINICAL DATA:  Altered mental status, follow-up infarct. History of polysubstance abuse, cardiomyopathy. EXAM: MRI HEAD WITHOUT CONTRAST MRA HEAD WITHOUT CONTRAST TECHNIQUE: Multiplanar, multiecho pulse sequences of the brain and surrounding structures were obtained without intravenous contrast. Angiographic images of the head were obtained using MRA  technique without contrast. COMPARISON:  CT HEAD September 04, 2018 FINDINGS: MRI HEAD FINDINGS INTRACRANIAL CONTENTS: Patchy reduced diffusion RIGHT parietal lobe, with subcentimeter foci reduced diffusion RIGHT frontal and temporal lobes, areas of diffusion abnormality demonstrate faintly decreased ADC values. No susceptibility artifact to suggest hemorrhage or septic emboli. A few scattered subcentimeter supratentorial white matter FLAIR T2 hyperintensities exclusive aforementioned abnormality. No midline shift, mass effect or masses. No abnormal extra-axial fluid collections. Borderline parenchymal brain volume loss for age. No hydrocephalus. VASCULAR: T2 bright signal RIGHT ICA. SKULL AND UPPER CERVICAL SPINE: No abnormal sellar expansion. No suspicious calvarial bone marrow signal. Craniocervical junction maintained. SINUSES/ORBITS: Bilateral mastoid effusions. Paranasal sinuses are well aerated. Secretions layering in the nasopharynx. Included ocular globes and orbital contents are non-suspicious. OTHER: Patient is edentulous.  Life support lines in place. MRA HEAD FINDINGS ANTERIOR CIRCULATION: Loss of RIGHT cervical internal carotid artery flow related enhancement, thready within RIGHT petrous to supraclinoid segments. Patent LEFT internal carotid artery. Patent anterior communicating artery and anterior cerebral arteries. Patent though slow flow RIGHT middle cerebral artery. Normal LEFT MCA. No flow limiting stenosis, aneurysm. POSTERIOR CIRCULATION: Codominant vertebral arteries. Vertebrobasilar arteries are patent, with normal flow related enhancement of the main branch vessels. Patent posterior cerebral arteries. No large vessel occlusion, flow limiting stenosis,  aneurysm. ANATOMIC VARIANTS: None. Source images and MIP images were reviewed. IMPRESSION: MRI HEAD: 1. 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 2. Borderline parenchymal brain volume loss for age. 3. Minimal  chronic small vessel ischemic changes. MRA HEAD: 1. 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 Recommend CTA head and neck. These results will be called to the ordering clinician or representative by the Radiologist Assistant, and communication documented in the PACS or zVision Dashboard. Electronically Signed   By: Awilda Metro M.D.   On: 09/05/2018 06:30   Carotid Doppler   09/05/2018 Limited evaluation of right carotid artery system due to central line placement.  There is evidence of a thumped right ICA waveform, suggestive of possible distal ICA occlusion.  Left internal carotid artery evaluation is suggestive of 1-39% stenosis. Left vertebral artery is patent with antegrade flow. Preliminary results discussed with Dr. Roda Shutters.  TTE  - Mild to moderate LV dilation with EF 20%, diffuse hypokinesis.   Restrictive diastolic function. Mildly dilated RV with mildly   decreased systolic function. No significant valvular   abnormalities.  LE venous doppler Right: There is no evidence of deep vein thrombosis in the lower extremity. However, portions of this examination were limited- see technologist comments above. No cystic structure found in the popliteal fossa. Left: There is no evidence of deep vein thrombosis in the lower extremity. However, portions of this examination were limited- see technologist comments above. No cystic structure found in the popliteal fossa.   PHYSICAL EXAM  Temp:  [98.4 F (36.9 C)-103.1 F (39.5 C)] 98.4 F (36.9 C) (10/26 1300) Pulse  Rate:  [80-117] 99 (10/26 1330) Resp:  [14-25] 14 (10/26 1330) BP: (105-149)/(75-122) 122/101 (10/26 1330) SpO2:  [97 %-100 %] 98 % (10/26 1330) FiO2 (%):  [40 %] 40 % (10/26 1122) Weight:  [105.7 kg] 105.7 kg (10/26 0500)  General - well nourished, well developed, intubated on precedex, opens eyes and grimaces to sternal rub, following commands  to squeeze fingers and wiggle toes on left.  Ophthalmologic - fundi not visualized due to noncooperation.    Cardiovascular - regular rate and rhythm.   Neuro - intubated on Precedex, opens eyes to stim, scleric icterus.  Pupil 2 mm bilaterally, sluggish to light, both eyes in upgaze position, +corneals, blinks to threat on right, gag and cough weakly present today.  Squeezes fingers to command and wiggles toes to command on the right only.DTRs hyporeflexic.   ASSESSMENT/PLAN Mr. Cody Patterson is a 42 y.o. male with history of CHF, cardiomyopathy with EF 10-20%, polysubstance abuse, paroxysmal A. fib not on [facility] admitted for shortness of breath.  Found to have bacteremia, fever, sepsis, respiratory failure requiring intubation and antibiotics, abnormal LFT with elevated INR and bilirubin, acute cholecystitis s/p percutaneous cholecystectomy, THC and amphetamine abuse, A. fib RVR and V. Tach resolved with amiodarone IV drip.09/04/18 had episode of agitation, tachypnea, tachycardia, hypertension, and abnormal breathing pattern during ventilation weaning, stat CT showed right parietal subacute small to moderate sized infarct.   Stroke:  right MCA small cortical infarcts, embolic pattern, source not certain, however, combination of many potential causes likely including A. fib RVR not on AC (but INR high), cardiomyopathy with low EF, and septic emboli   Resultant coma  CT head right parietal small subacute infarct  MRI  Right MCA cortical small infarcts  MRA  Right ICA occlusion vs. High grade stenosis  Carotid Doppler - Limited evaluation of right carotid artery system due to central line placement. Tere is evidence of a thumped right ICA waveform, suggestive of possible distal ICA occlusion.  Left internal carotid artery evaluation is suggestive of 1-39% stenosis. Left vertebral artery is patent with antegrade flow. Technologist discussed preliminary results with Dr Roda Shutters Friday.  2D Echo  EF  20%  LE venous doppler no DVT  UDS - It appears to have been cancelled  LDL NTC due to low HDL  HgbA1c 6.2  SCDs for VTE prophylaxis  No antithrombotic prior to admission, now on No antithrombotic given high INR. Consider anticoagulation once INR < 1.5 (1.66 on Saturday)  Ongoing aggressive stroke risk factor management  Therapy recommendations:  pending  Disposition:  pending  Right ICA occlusion vs high grade stenosis  MRA suggest proximal ICA occlusion with distal recon but slow flow  CUS - see above  BP goal 120-140  Not candidate for intervention at this time  Once Cre normalized, may consider CTA head and neck (1.61 on Saturday)  Sepsis and septic shock  Blood culture NGTD  US abdomen suggestive of cholecystitis  S/p percutaneous drain - bile culture NTGD  Leukocytosis WBC 14.6->18.0  BP on the low side  On levophed  Cardiomyopathy   Cardiogenic shock  Cardiology on board  EF 20%  Not on AC due to high INR  Liver failure  Sclera jaundice  Total bilirubin 11.9->14.3  AST ALT elevated  INR 2.1->1.57->2.01->1.87-> 1.66  AKI  On IVF  Cre 1.83->1.58 -> 1.61  May consider CTA head and neck once Cre normalized  Hypotension . On the low side . On levophed  BP goal 120-140 given  right ICA occlusion vs. High grade stenosis  Other Stroke Risk Factors  Obesity, Body mass index is 31.6 kg/m.   Substance abuse - amphetamine and THC positive on UDS  Other Active Problems  Hypernatremia - 153  Hospital day # 10  This patient is critically ill due to stroke, sepsis, septic shock, cardiogenic shock, cardiomyopathy, liver failure and at significant risk of neurological worsening, death form recurrent stroke, sepsis, heart failure, cardiac arrest, cirrhosis. This patient's care requires constant monitoring of vital signs, hemodynamics, respiratory and cardiac monitoring, review of multiple databases, neurological assessment, discussion  with family, other specialists and medical decision making of high complexity. I spent 35 minutes of neurocritical care time in the care of this patient.     To contact Stroke Continuity provider, please refer to [email or web address]. After hours, contact General Neurology

## 2018-09-07 ENCOUNTER — Inpatient Hospital Stay (HOSPITAL_COMMUNITY): Payer: Medicaid Other

## 2018-09-07 ENCOUNTER — Encounter (HOSPITAL_COMMUNITY): Payer: Self-pay | Admitting: Internal Medicine

## 2018-09-07 DIAGNOSIS — R17 Unspecified jaundice: Secondary | ICD-10-CM

## 2018-09-07 LAB — GLUCOSE, CAPILLARY
GLUCOSE-CAPILLARY: 117 mg/dL — AB (ref 70–99)
Glucose-Capillary: 103 mg/dL — ABNORMAL HIGH (ref 70–99)
Glucose-Capillary: 80 mg/dL (ref 70–99)
Glucose-Capillary: 102 mg/dL — ABNORMAL HIGH (ref 70–99)
Glucose-Capillary: 126 mg/dL — ABNORMAL HIGH (ref 70–99)

## 2018-09-07 LAB — CULTURE, RESPIRATORY

## 2018-09-07 LAB — HEPATIC FUNCTION PANEL
ALT: 264 U/L — ABNORMAL HIGH (ref 0–44)
AST: 144 U/L — AB (ref 15–41)
Albumin: 1.9 g/dL — ABNORMAL LOW (ref 3.5–5.0)
Alkaline Phosphatase: 73 U/L (ref 38–126)
BILIRUBIN DIRECT: 6.9 mg/dL — AB (ref 0.0–0.2)
Total Bilirubin: 24.5 mg/dL (ref 0.3–1.2)
Total Protein: 5.6 g/dL — ABNORMAL LOW (ref 6.5–8.1)

## 2018-09-07 LAB — VANCOMYCIN, TROUGH: VANCOMYCIN TR: 8 ug/mL — AB (ref 15–20)

## 2018-09-07 LAB — BASIC METABOLIC PANEL
Anion gap: 8 (ref 5–15)
BUN: 79 mg/dL — AB (ref 6–20)
CHLORIDE: 117 mmol/L — AB (ref 98–111)
CO2: 30 mmol/L (ref 22–32)
CREATININE: 1.52 mg/dL — AB (ref 0.61–1.24)
Calcium: 8.5 mg/dL — ABNORMAL LOW (ref 8.9–10.3)
GFR calc Af Amer: >60 mL/min (ref >60)
GFR, EST NON AFRICAN AMERICAN: 56 mL/min — AB (ref >60)
Glucose, Bld: 129 mg/dL — ABNORMAL HIGH (ref 70–99)
Potassium: 4.7 mmol/L (ref 3.5–5.1)
SODIUM: 155 mmol/L — AB (ref 135–145)

## 2018-09-07 LAB — COOXEMETRY PANEL
CARBOXYHEMOGLOBIN: 2.2 % — AB (ref 0.5–1.5)
METHEMOGLOBIN: 0.7 % (ref 0.0–1.5)
O2 Saturation: 77.5 %
TOTAL HEMOGLOBIN: 15.7 g/dL (ref 12.0–16.0)

## 2018-09-07 LAB — CBC
HCT: 47.9 % (ref 39.0–52.0)
HEMOGLOBIN: 14.9 g/dL (ref 13.0–17.0)
MCH: 27.5 pg (ref 26.0–34.0)
MCHC: 31.1 g/dL (ref 30.0–36.0)
MCV: 88.5 fL (ref 80.0–100.0)
PLATELETS: 91 10*3/uL — AB (ref 150–400)
RBC: 5.41 MIL/uL (ref 4.22–5.81)
RDW: 23.8 % — AB (ref 11.5–15.5)
WBC: 21.6 10*3/uL — AB (ref 4.0–10.5)
nRBC: 0.1 % (ref 0.0–0.2)

## 2018-09-07 LAB — CULTURE, RESPIRATORY W GRAM STAIN

## 2018-09-07 LAB — PROTIME-INR
INR: 1.71
PROTHROMBIN TIME: 19.8 s — AB (ref 11.4–15.2)

## 2018-09-07 LAB — LACTATE DEHYDROGENASE: LDH: 412 U/L — ABNORMAL HIGH (ref 98–192)

## 2018-09-07 LAB — MAGNESIUM: MAGNESIUM: 2.5 mg/dL — AB (ref 1.7–2.4)

## 2018-09-07 MED ORDER — VANCOMYCIN HCL IN DEXTROSE 1-5 GM/200ML-% IV SOLN
1000.0000 mg | Freq: Two times a day (BID) | INTRAVENOUS | Status: DC
  Administered 2018-09-07 – 2018-09-09 (×4): 1000 mg via INTRAVENOUS
  Filled 2018-09-07 (×3): qty 200

## 2018-09-07 NOTE — Progress Notes (Deleted)
Written consent obtained from daughter Waverly Ferrari of patient for insertion of central venous access.

## 2018-09-07 NOTE — Consult Note (Signed)
Consultation  Referring Provider:     Dr. Chase Caller Primary Care Physician:  Patient, No Pcp Per Primary Gastroenterologist:       unassigned  Reason for Consultation:     Jaundice     Impression / Plan:   Jaundice, hyperbilirubinemia with a large unconjugated or indirect component.  During hospital course but market change recently Fever Recent cardiogenic shock which led to shock liver and shock kidney Multifocal right MCA distribution strokes  Not sure what that because of the jump and his bilirubin is but I wonder about some sort of hemolysis though his hemoglobin is normal I think that is possible.  He could have endocarditis. Cannot hear a murmur with the toy stethoscope we use with MRSA precautions.  He needs an echocardiogram again.   Check haptoglobin LDH  Follow in the a.m.  I doubt this is a primary liver problem based upon the available data.  Certainly had shock liver and a rise in bilirubin afterwards makes sense but there is been an acute change as well in the last few days.  I know he had a cholecystostomy tube placed but that was because of an inability to understand whether he actually had cholecystitis and I but he did not.   [name], MD, [facility] 09/07/2018 5:35 PM Pager [phone]           HPI:   Cody Patterson is a 42 y.o. male admitted with cardiogenic shock weeks ago, shock liver shock kidney he remains on the ventilator with failure to recover and its in the setting of polysubstance abuse and possible IV drug abuse.  He had been sort of stable but lately fevers have come back, blood cultures negative so far, and his bilirubin took a big jump most of which is indirect.  Dr. Chase Caller asked me to see him regarding the jaundice.  He clearly had shock liver with market abnormalities of his transaminases on admission.  He has had multifocal strokes in the right MCA distribution.  He has atherosclerosis as well.  Past  Medical History:  Diagnosis Date  . Cardiomyopathy ([place])   . Heart failure ([place])   . Polysubstance abuse ([place])    THC and Amphetamines    Past Surgical History:  Procedure Laterality Date  . IR PERC CHOLECYSTOSTOMY  08/31/2018   Family history not obtained due to illness  Social History   Tobacco Use  . Smoking status: Not on file  Substance Use Topics  . Alcohol use: Not on file  . Drug use: Yes    Types: Methamphetamines, Marijuana    Comment: UDS positive on admission to [facility]    Prior to Admission medications   Not on File    Current Facility-Administered Medications  Medication Dose Route Frequency Provider Last Rate Last Dose  . 0.9 %  sodium chloride infusion  250 mL Intravenous Continuous Kipp Brood, MD   Stopped at 08/21/2018 3532  . 0.9 %  sodium chloride infusion   Intravenous Continuous Bensimhon, Shaune Pascal, MD 10 mL/hr at 09/07/18 1700    . amiodarone (NEXTERONE PREMIX) 360-4.14 MG/200ML-% (1.8 mg/mL) IV infusion  30 mg/hr Intravenous Continuous Bensimhon, Shaune Pascal, MD 16.67 mL/hr at 09/07/18 1700 30 mg/hr at 09/07/18 1700  . chlorhexidine gluconate (MEDLINE KIT) (PERIDEX) 0.12 % solution 15 mL  15 mL Mouth Rinse BID Mannam, Praveen, MD   15 mL at 09/07/18 0900  . Chlorhexidine Gluconate Cloth 2 % PADS 6 each  6  each Topical Daily Mannam, Praveen, MD   6 each at 09/06/18 2345  . famotidine (PEPCID) IVPB 20 mg premix  20 mg Intravenous Q24H Bensimhon, Shaune Pascal, MD 100 mL/hr at 09/07/18 0938 20 mg at 09/07/18 0938  . feeding supplement (PRO-STAT SUGAR FREE 64) liquid 60 mL  60 mL Per Tube TID Brand Males, MD   60 mL at 09/07/18 1617  . feeding supplement (VITAL HIGH PROTEIN) liquid 1,000 mL  1,000 mL Per Tube Q24H Brand Males, MD   1,000 mL at 09/07/18 0945  . fentaNYL (SUBLIMAZE) injection 100 mcg  100 mcg Intravenous Q15 min PRN Brand Males, MD      . fentaNYL (SUBLIMAZE) injection 100 mcg  100 mcg Intravenous Q2H PRN Brand Males, MD       . fentaNYL 2550mg in NS 2524m(1066mml) infusion-PREMIX  0-400 mcg/hr Intravenous Continuous RamBrand MalesD 40 mL/hr at 09/07/18 1700 400 mcg/hr at 09/07/18 1700  . folic acid injection 1 mg  1 mg Intravenous Daily Minor, WilGrace BushyP   1 mg at 09/07/18 0941  . free water 400 mL  400 mL Per Tube Q8H Desai, Rahul P, PA-C   400 mL at 09/07/18 1400  . heparin injection 5,000 Units  5,000 Units Subcutaneous Q8H Desai, Rahul P, PA-C   5,000 Units at 09/07/18 1400  . insulin aspart (novoLOG) injection 0-9 Units  0-9 Units Subcutaneous Q4H Deterding, EliGuadelupe SabinD   1 Units at 09/05/18 0035  . MEDLINE mouth rinse  15 mL Mouth Rinse 10 times per day Mannam, PraHart RobinsonsD   15 mL at 09/07/18 1723  . meropenem (MERREM) 2 g in sodium chloride 0.9 % 100 mL IVPB  2 g Intravenous Q8H Carney, Jessica C, RPH 200 mL/hr at 09/07/18 1500 2 g at 09/07/18 1500  . midazolam (VERSED) injection 2 mg  2 mg Intravenous Q2H PRN Deterding, EliGuadelupe SabinD   2 mg at 09/07/18 0210  . milrinone (PRIMACOR) 20 MG/100 ML (0.2 mg/mL) infusion  0.125 mcg/kg/min Intravenous Continuous SmiGeorgiana ShoreP 3.94 mL/hr at 09/07/18 1700 0.125 mcg/kg/min at 09/07/18 1700  . multivitamin liquid 15 mL  15 mL Per Tube Daily RamBrand MalesD   15 mL at 09/07/18 0941  . norepinephrine (LEVOPHED) 16 mg in D5W 250m62memix infusion  0-20 mcg/min Intravenous Titrated AlvaRigoberto Noel   Stopped at 09/06/18 0410  . senna-docusate (Senokot-S) tablet 2 tablet  2 tablet Per Tube BID RamaBrand Males   2 tablet at 09/07/18 0940  . sodium chloride flush (NS) 0.9 % injection 10-40 mL  10-40 mL Intracatheter Q12H Mannam, Praveen, MD   10 mL at 09/05/18 2152  . sodium chloride flush (NS) 0.9 % injection 10-40 mL  10-40 mL Intracatheter PRN Mannam, Praveen, MD      . sodium chloride flush (NS) 0.9 % injection 5 mL  5 mL Intracatheter Q8H Wagner, Jaime, DO   5 mL at 09/07/18 1500  . thiamine (B-1) injection 100 mg  100 mg Intravenous  Daily Minor, WillGrace Bushy   100 mg at 09/07/18 0940  . vancomycin (VANCOCIN) IVPB 1000 mg/200 mL premix  1,000 mg Intravenous Q12H WilsLyndee LeoH 200 mL/hr at 09/07/18 1700 1,000 mg at 09/07/18 1700    Allergies as of 08/22/2018 - Review Complete 09/06/2018  Allergen Reaction Noted  . Amoxicillin Swelling 08/12/2018  . Penicillins Swelling 09/10/2018     Review of Systems:    Unable to obtain  he is intubated      Physical Exam:  Vital signs in last 24 hours: Temp:  [99.7 F (37.6 C)-102.2 F (39 C)] 100.9 F (38.3 C) (10/27 1600) Pulse Rate:  [105-124] 112 (10/27 1600) Resp:  [13-17] 14 (10/27 1600) BP: (100-132)/(74-111) 102/88 (10/27 1600) SpO2:  [95 %-99 %] 97 % (10/27 1600) FiO2 (%):  [30 %] 30 % (10/27 1517) Weight:  [104.1 kg] 104.1 kg (10/27 0500) Last BM Date: 09/09/2018  General:  Vertically ill intubated multiple drips  eyes:  icteric. ENT:   Oral endotracheal tube in place NG tube in place.  Lungs: Clear to auscultation bilaterally.  [place] Heart:   Stent S1S2, no rubs, murmurs, gallops. Abdomen:  soft, non-tender, no hepatosplenomegaly, hernia, or mass and BS+.  Extremities:   Trace edema, I do not see any plantar hemorrhages or Janeway lesions in the nails Skin  deeply jaundiced Neuro:  Sedated.     Data Reviewed:   LAB RESULTS: Recent Labs    09/05/18 0227 09/06/18 0435 09/06/18 1515 09/07/18 0320  WBC 18.0* 23.1*  --  21.6*  HGB 15.2 16.6 17.7* 14.9  HCT 45.8 53.0* 52.0 47.9  PLT 121* 99*  --  91*   BMET Recent Labs    09/05/18 0227 09/06/18 0435 09/06/18 1515 09/07/18 0320  NA 154* 153* 155* 155*  K 4.3 4.6 4.6 4.7  CL 117* 113* 113* 117*  CO2 32 31  --  30  GLUCOSE 126* 114* 99 129*  BUN 87* 90* 77* 79*  CREATININE 1.58* 1.61* 1.70* 1.52*  CALCIUM 7.8* 8.4*  --  8.5*   LFT Recent Labs    09/07/18 0852  PROT 5.6*  ALBUMIN 1.9*  AST 144*  ALT 264*  ALKPHOS 73  BILITOT 24.5*  BILIDIR 6.9*  IBILI NOT CALCULATED     PT/INR Recent Labs    09/06/18 0435 09/07/18 0320  LABPROT 19.4* 19.8*  INR 1.66 1.71    STUDIES: Ct Abdomen Pelvis Wo Contrast  Result Date: 09/06/2018 CLINICAL DATA:  Pt is septic, s/p chole drain placed 10/20. Pt has continued to run a fever the last several days. Concern for possible infection. EXAM: CT CHEST, ABDOMEN AND PELVIS WITHOUT CONTRAST TECHNIQUE: Multidetector CT imaging of the chest, abdomen and pelvis was performed following the standard protocol without IV contrast. COMPARISON:  Abdomen ultrasound, 09/05/2018. CTs, 07/12/2014 and 07/05/2014. FINDINGS: CT CHEST FINDINGS Cardiovascular: Heart is mildly enlarged. No pericardial effusion. Left and right coronary artery calcifications. Great vessels are normal in caliber. No aortic atherosclerotic calcifications. Mediastinum/Nodes: No neck base or axillary masses or enlarged lymph nodes. No mediastinal or hilar masses or enlarged lymph nodes. Endotracheal tube tip projects 4 cm above the Carina. Right internal jugular central venous line tip lies at the caval atrial junction. Nasal/orogastric tube passes below the diaphragm into the mid stomach. Lungs/Pleura: Small bilateral pleural effusions. There is dependent opacity in both lower lobes with adjacent ground-glass type opacities. Subtle hazy ground-glass opacity is noted in the posterior right upper lobe and in the posterolateral left upper lobe. There is no interstitial thickening or vascular congestion to suggest pulmonary edema. No lung mass or discrete nodule. No pneumothorax. Musculoskeletal: Developmentally small disks from T3 through T5. No fracture or acute finding. No osteoblastic or osteolytic lesions. CT ABDOMEN PELVIS FINDINGS Hepatobiliary: Liver normal in size with no mass or focal lesion. Gallbladder is been decompressed with a cholecystotomy tube. There is no fluid collection in the gallbladder fossa to suggest a bile  leak or abscess. No bile duct dilation. Pancreas:  Unremarkable. No pancreatic ductal dilatation or surrounding inflammatory changes. Spleen: Normal in size without focal abnormality. Adrenals/Urinary Tract: No adrenal masses. Kidneys normal in size, orientation and position. No renal masses, stones or hydronephrosis. Normal ureters. Bladder decompressed with a Foley catheter. Stomach/Bowel: Stomach is unremarkable. Small bowel is normal caliber. No wall thickening or inflammation. Mild distention of the right colon. No colonic wall thickening or inflammation. Mild generalized increased stool burden noted in the colon most evident in the right colon. Appendix not discretely visualized. No evidence of appendicitis. Vascular/Lymphatic: Aortic atherosclerosis. No enlarged abdominal or pelvic lymph nodes. Reproductive: Unremarkable. Other: Minimal ascites noted adjacent to the liver and in the pelvis. Diffuse subcutaneous edema noted along the lower abdomen and pelvis extending to the proximal lower extremities. No abdominal wall hernia. Musculoskeletal: No acute or significant osseous findings. IMPRESSION: CHEST CT 1. There are areas of ground-glass opacity in the lungs. This may be due to mild airspace edema or infection/inflammation. 2. There is dependent opacity in both lower lobes that is most consistent with atelectasis, associated with small effusions. 3. Mild cardiomegaly. ABDOMEN AND PELVIS CT 1. Status post cholecystotomy for decompression. Tube is well positioned. No gallbladder fossa collection to suggest a bile leak or abscess. 2. Minimal amount of ascites noted adjacent to the liver and in the pelvis. This is nonspecific and along with lower abdominal and pelvic soft tissue edema and small pleural effusions suggests a diffuse edematous state. 3. No acute findings within the abdomen or pelvis. 4. Mild generalized increased stool in the colon. No bowel inflammation or obstruction. Electronically Signed   By: Lajean Manes M.D.   On: 09/06/2018 14:33   Ct  Chest Wo Contrast  Result Date: 09/06/2018 CLINICAL DATA:  Pt is septic, s/p chole drain placed 10/20. Pt has continued to run a fever the last several days. Concern for possible infection. EXAM: CT CHEST, ABDOMEN AND PELVIS WITHOUT CONTRAST TECHNIQUE: Multidetector CT imaging of the chest, abdomen and pelvis was performed following the standard protocol without IV contrast. COMPARISON:  Abdomen ultrasound, 09/05/2018. CTs, 07/12/2014 and 07/05/2014. FINDINGS: CT CHEST FINDINGS Cardiovascular: Heart is mildly enlarged. No pericardial effusion. Left and right coronary artery calcifications. Great vessels are normal in caliber. No aortic atherosclerotic calcifications. Mediastinum/Nodes: No neck base or axillary masses or enlarged lymph nodes. No mediastinal or hilar masses or enlarged lymph nodes. Endotracheal tube tip projects 4 cm above the Carina. Right internal jugular central venous line tip lies at the caval atrial junction. Nasal/orogastric tube passes below the diaphragm into the mid stomach. Lungs/Pleura: Small bilateral pleural effusions. There is dependent opacity in both lower lobes with adjacent ground-glass type opacities. Subtle hazy ground-glass opacity is noted in the posterior right upper lobe and in the posterolateral left upper lobe. There is no interstitial thickening or vascular congestion to suggest pulmonary edema. No lung mass or discrete nodule. No pneumothorax. Musculoskeletal: Developmentally small disks from T3 through T5. No fracture or acute finding. No osteoblastic or osteolytic lesions. CT ABDOMEN PELVIS FINDINGS Hepatobiliary: Liver normal in size with no mass or focal lesion. Gallbladder is been decompressed with a cholecystotomy tube. There is no fluid collection in the gallbladder fossa to suggest a bile leak or abscess. No bile duct dilation. Pancreas: Unremarkable. No pancreatic ductal dilatation or surrounding inflammatory changes. Spleen: Normal in size without focal  abnormality. Adrenals/Urinary Tract: No adrenal masses. Kidneys normal in size, orientation and position. No renal masses, stones or hydronephrosis.  Normal ureters. Bladder decompressed with a Foley catheter. Stomach/Bowel: Stomach is unremarkable. Small bowel is normal caliber. No wall thickening or inflammation. Mild distention of the right colon. No colonic wall thickening or inflammation. Mild generalized increased stool burden noted in the colon most evident in the right colon. Appendix not discretely visualized. No evidence of appendicitis. Vascular/Lymphatic: Aortic atherosclerosis. No enlarged abdominal or pelvic lymph nodes. Reproductive: Unremarkable. Other: Minimal ascites noted adjacent to the liver and in the pelvis. Diffuse subcutaneous edema noted along the lower abdomen and pelvis extending to the proximal lower extremities. No abdominal wall hernia. Musculoskeletal: No acute or significant osseous findings. IMPRESSION: CHEST CT 1. There are areas of ground-glass opacity in the lungs. This may be due to mild airspace edema or infection/inflammation. 2. There is dependent opacity in both lower lobes that is most consistent with atelectasis, associated with small effusions. 3. Mild cardiomegaly. ABDOMEN AND PELVIS CT 1. Status post cholecystotomy for decompression. Tube is well positioned. No gallbladder fossa collection to suggest a bile leak or abscess. 2. Minimal amount of ascites noted adjacent to the liver and in the pelvis. This is nonspecific and along with lower abdominal and pelvic soft tissue edema and small pleural effusions suggests a diffuse edematous state. 3. No acute findings within the abdomen or pelvis. 4. Mild generalized increased stool in the colon. No bowel inflammation or obstruction. Electronically Signed   By: Lajean Manes M.D.   On: 09/06/2018 14:33        Thanks   LOS: 11 days   '@Romelo Sciandra'  Simonne Maffucci, MD, [facility] @  09/07/2018, 5:35 PM

## 2018-09-07 NOTE — Progress Notes (Signed)
This RN and Lorelle Formosa, RN were asked to give an update to the patient's brother, "Onalee Hua" who stated he had traveled via bus from [place] to see the patient.  Brother was very tearful and we spoke to him in the consult room outside in the waiting room to advise of what equipment he would see.  Onalee Hua was brought to the bedside and was tearful, but able to handle the situation appropriately. Comfort measures were provided by both RN's. Onalee Hua spoke to patient and patient opened eyes in response. Patient did have any acute changes in status with visit.

## 2018-09-07 NOTE — Progress Notes (Signed)
Pharmacy Antibiotic Note  Cody Patterson is a 42 y.o. male admitted on 08/20/2018 with sepsis.  Pharmacy has been consulted for meropenem dosing.  SCr relatively stable, up slightly today. WBC trending back down, also on steroids. Vancomycin discontinued 10/21. Given fever despite meropenem therapy, pharmacy has been re-consulted to dose vancomycin.  Fever of 102.2 this morning, wbc 21, pct 5, scr trending down to 1.5.   Vancomycin trough this morning was low at 8, drawn appropriately based on yesterdays dose hung late.   Plan: Increase to Vancomycin 1g IV q12 hours - recheck trough early this week  Continue meropenem to 2g q 8 hrs. F/u repeat cultures, and length of therapy pending clinical course  Height: 6' (182.9 cm) Weight: 229 lb 8 oz (104.1 kg) IBW/kg (Calculated) : 77.6  Temp (24hrs), Avg:100.1 F (37.8 C), Min:98.4 F (36.9 C), Max:102.2 F (39 C)  Recent Labs  Lab 09/01/18 0356  09/03/18 0322 09/04/18 0332 09/04/18 0736 09/05/18 0227 09/06/18 0435 09/06/18 1515 09/07/18 0320  WBC 14.9*   < > 17.9* 14.6*  --  18.0* 23.1*  --  21.6*  CREATININE 1.92*   < > 1.67* 1.83*  --  1.58* 1.61* 1.70* 1.52*  LATICACIDVEN 1.5  --   --   --  1.7  --  1.7  --   --    < > = values in this interval not displayed.    Estimated Creatinine Clearance: 80.6 mL/min (A) (by C-G formula based on SCr of 1.52 mg/dL (H)).    Allergies  Allergen Reactions  . Amoxicillin Swelling  . Penicillins Swelling   Antimicrobials this admission:  Vanc 10/17 >> 10/21; 10/24 >>  Mero 10/17 >>   Dose adjustments this admission:  10/19 VR 5> start [id number]  10/20 mero increased 2g q8h, renal function improved  10/27 VT 8>increase vancomycin to 1g q12 hours  Microbiology results:  10/25 BCx x2: NGTD 10/25 TA: moderate candida 10/25 urine: ng 10/23 BCx x2: NG 10/17 Bcx x 2: NG 10/17: Ucx: NG 10/17: sputum: mod Hflu, B lactamse + 10/16: mrsa positive   Thank you for allowing pharmacy to be  a part of this patient's care.  Sheppard Coil PharmD., BCPS Clinical Pharmacist 09/07/2018 11:41 AM

## 2018-09-07 NOTE — Progress Notes (Signed)
Called Elink regarding increasing patient temp 102.2 despite cooling blanket and ice packs to groin and axilla. No new orders received d/t pt labs. Will continue the use of ice packs and continue to monitor closely.

## 2018-09-07 NOTE — Progress Notes (Signed)
eLink Physician-Brief Progress Note Patient Name: Cody Patterson DOB: Nov 21, 1975 MRN: [id number]   Date of Service  09/07/2018  HPI/Events of Note  Nursing concerned about respiratory pattern. Video assessment of patient --> Patient looks like he has hiccups.   eICU Interventions  Will order: 1. Hold tube feeds.  2. OGT to LIS.      Intervention Category Major Interventions: Other:  Sommer,Steven Dennard Nip 09/07/2018, 3:03 AM

## 2018-09-07 NOTE — Progress Notes (Signed)
Telephone call received from patient's spouse Cody Patterson who requested an update.  After update given, Cody Patterson verbalized wishes to recind the DNR and make the patient a full code.  Cody Patterson was given the list of interventions that can be performed as a full code.  Cody Patterson remains in agreement for patient to be a Full code and if trach and PEG needed, she is in agreement.

## 2018-09-07 NOTE — Progress Notes (Signed)
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 Presented to [facility] with increasing dyspnea. Acute decompensation with hypotension and severe respiratory distress requiring intubation. Transferred to [facility] for management of sepsis, advanced heart failure.  Growing H influenza in sputum.  History noted for admission to 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  Past Medical History  Known cardiomyopathy and drug abuse (THC and methamphetamines)  Significant Hospital Events   10/16- Intubated at [facility] 10/17- Worsening shock, maxed on pressors, right heart cath indicative of distributive shock, high output failure 10/18-Started on milrinone, lasix for reduced cardiac output. PA numbers show worsening CO and high wedge 10/19- Started making urine, pressors weaning down, continues on milrinone. Surgery consulted for cholecystitis 10/20- Mucus plug overnight requiring bag, lavage. Went into afib.  Perc chole drain placed by IR 10/23 Start SBT trials, failed due to tachypnea 10/24 [facility] type respirations > CT head which showed stroke 10/26 -  spiking fevers 103 - swinging fevers since 09/03/18. Rising bilirubin. Rpt [place] c/w Acute choley - per IR yesterday - GB drain working well. CT chest/abd - GB drain ok. Stoool + . No abscess CCS suspecting this is cholestasis; not a candidate for surgery. Total Bili > IndirectBili. Remains on amio gtt, milrinone, Of levophed . MADE DNR by Palliative care  Consults: date of consult/date signed off & final recs:  10/17-  Cardiology  10/19- Surgery signed on 09/02/2017 10/24 Neurology 10/26 - pallaitive  Procedures (surgical and bedside):  10/16 [facility] R subclavian CVC 10/16 [facility] L femoral arterial line 10/17 PA catheter 10/20 Perc chole drain  Significant Diagnostic Tests:  Echocardiogram 10/17- Mild to moderate LV dilation with EF 20%, diffuse hypokinesis.  Restrictive diastolic function. Mildly dilated RV with mildly decreased systolic function. No significant valvular abnormalities Abdominal ultrasound 08/29/2018- severe gallbladder thickening, sludge, cholelithiasis, small volume of perihepatic, pericholecystic and perinephric free fluid. UDS [facility] 10/16- Amphetamines, THC [place] 10/18 Initial numbers RA 10 PA 46/33 (41) PCWP 10 Thermo CO/CI  15.2/7.0 Fick 14.1/6.5 (co-ox 86%) SVR 231 CT head 10/24 > small area of hypoattenuation in right parietal lobe suspicious for recent acute to subacute infarct. 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  Occluded R ICA, slow flow R MCA.  Micro Data:  Bcx 10/17 >> ng Trach aspirate 10/17- H influenza MRSA PR 10/16 - positive Blood 10/22 - neg as of 10/27 Trach aspirate 10/25 - moderate yeast   Antimicrobials:  Vancomycin 10/17 > 10/21. 10/24 >  Meropenen 10/17 > Aztreonam 10/17>> off  SUBJECTIVE/OVERNIGHT/INTERVAL HX   09/07/18 - fever + by peak of curve < 103F WBC down trend. Now DNR but full medical care. Hiccups + and TF held. To neuro he was able to squeeze fingers an wiggle on right   Objective   Blood pressure 110/90, pulse (!) 107, temperature 99.9 F (37.7 C), resp. rate 14, height 6' (1.829 m), weight 104.1 kg, SpO2 98 %.    Vent Mode: PRVC FiO2 (%):  [30 %-40 %] 30 % Set Rate:  [14 bmp] 14 bmp Vt  Set:  [540 mL] 540 mL PEEP:  [5 cmH20] 5 cmH20 Plateau Pressure:  [14 cmH20-22 cmH20] 14 cmH20   Intake/Output Summary (Last 24 hours) at  09/07/2018 0800 Last data filed at 09/07/2018 0700 Gross per 24 hour  Intake 4431.17 ml  Output 3775 ml  Net 656.17 ml   Filed Weights   09/05/18 0800 09/06/18 0500 09/07/18 0500  Weight: 100.7 kg 105.7 kg 104.1 kg  General Appearance:  Looks criticall ill. JAUNDICED Head:  Normocephalic, without obvious abnormality, atraumatic Eyes:  PERRL - yes, conjunctiva/corneas - JAUNDICED     Ears:  Normal external ear canals, both ears Nose:  G tube - no Throat:  ETT TUBE - yes , OG tube - yes. TF on hold Neck:  Supple,  No enlargement/tenderness/nodules Lungs: Clear to auscultation bilaterally, Ventilator   Synchrony - yes Heart:  S1 and S2 normal, no murmur, CVP - no.  Pressors - milrinone +, amio gtt + Abdomen:  Soft, no masses, no organomegaly Genitalia / Rectal:  Not done Extremities:  Extremities- intact, but edema and jaundice Skin:  ntact in exposed areas . JAUNDICD Neurologic:  Sedation - fent gtt -> RASS - -4 .      Assessment & Plan:   #Shock - Cardiogenic (AoC sCHF with cardiomyopathy and EF 10%), septic combination.  Shock physiology resolved and has been off all pressors up until evening of 10/24 when required low dose norepi to support BP given new findings of MCA stroke. Severe cardiomyopathy, noncompliant. V tach, A. Fib (resolved with amiodarone).  10/27 -  In sinus . On milrinone and amio gtt. Off levophed x 48h  PLAN - per cards; amio gtt and milrionone Gtt and LVAD due to substance abuse hx. Diuresis decision by cards - SBP > 110 (for stroke, not for sepsis/shock)   #Acute respiratory failure secondary to pulmonary edema and H. influenzae pneumonia. - Has not tolerated weaning thus far due to agitation and tachypnea (stroke likely playing a role too).  - 09/07/2018 - > does not meet criteria for SBT/Extubation in setting of Acute Respiratory Failure due to enceophalopathy, stroke, circulatory failure, jaundice    #Severe sepsis-H. influenzae pneumonia, +/  Acute cholecystitis.   10/27 - fever still + but ? Curve better. CT abd/chest non revealing. Biliary drain appears appropriate   PLAN Continue merrem and vanc    #Acute metabolic encephalopathy-related to sedation + ICU delirium + new stroke.  10/27 - on fent gtt  PLAN fent gtt RASS goal 0 to -2  #Right MCA stroke. - Neurology following. - Levophed as needed to maintain SBP > 110. - not needing 09/07/18  #Renal failure. - improving 09/07/18 -  Continue to follow urine output and creatinine. - Diuresis as able per cards  #Hypernatremia. - free water  #Elevated LFTs,Acute cholecystitis - s/p perc drain 10/20.  10/26 - worsenign biliriubin. Per IR and CCCS drain working well. Jaundiced reported with merrem < 1% 10/27 - still jaundiced.No etiology on CT abd/pelvis 09/06/18  PLAN - Continue drain x 4 - 6 weeks then can determine if surgery will be necessary. - check repeat LFT - check hepatitis virus panel, ANA, ANti smith, ceruloplasmin - Might need GI consult   Disposition / Summary of Today's Plan 09/07/18   Check hep panel. Might need GI consult for worsening jaundice with ongoing fevers  Best Practice    Code Status: DNR but full medical care since 09/06/2018. No LTAC/Trach (palliative discussing) Family Communication:  09/07/2018 - girlfried at  bedside sleeping; not updated    ATTESTATION & SIGNATURE   The patient Daeton Kluth is critically ill with multiple organ systems failure and requires high complexity decision making for assessment and support, frequent evaluation and titration of therapies, application of advanced monitoring technologies and extensive interpretation of multiple databases.   Critical Care Time devoted to patient care services described in this note is  30  Minutes. This time reflects time of care of this signee Dr Kalman Shan. This critical care time does not reflect procedure time, or teaching time or supervisory time of  PA/NP/Med student/Med Resident etc but could involve care discussion time     Dr. Kalman Shan, M.D., [facility].C.P Pulmonary and Critical Care Medicine Staff Physician [place] [facility] [place] [facility] Pager: [phone], If no answer or between  15:00h - 7:00h: call [phone]  09/07/2018 8:22 AM    LABS    PULMONARY Recent Labs  Lab 09/01/18 0408  09/04/18 0513 09/05/18 0313 09/05/18 0350 09/06/18 0450 09/06/18 1515 09/07/18 0600  PHART 7.440  --  7.503*  --  7.478*  --   --   --   PCO2ART 45.9  --  41.5  --  41.6  --   --   --   PO2ART 130.0*  --  266.0*  --  160.0*  --   --   --   HCO3 31.3*  --  32.3*  --  30.8*  --   --   --   TCO2 33*  --  33*  --  32  --  33*  --   O2SAT 99.0   < > 100.0 59.3 100.0 71.0  --  77.5   < > = values in this interval not displayed.    CBC Recent Labs  Lab 09/05/18 0227 09/06/18 0435 09/06/18 1515 09/07/18 0320  HGB 15.2 16.6 17.7* 14.9  HCT 45.8 53.0* 52.0 47.9  WBC 18.0* 23.1*  --  21.6*  PLT 121* 99*  --  91*    COAGULATION Recent Labs  Lab 09/03/18 0322 09/04/18 0332 09/05/18 0227 09/06/18 0435 09/07/18 0320  INR 1.57 2.01 1.87 1.66 1.71    CARDIAC  No results for input(s): TROPONINI in the last 168 hours. No results for input(s): PROBNP in the last 168 hours.   CHEMISTRY Recent Labs  Lab 09/01/18 0356  09/02/18 0409 09/03/18 0322 09/04/18 0332 09/05/18 0227 09/06/18 0435 09/06/18 1514 09/06/18 1515 09/07/18 0320  NA 138   < > 142 146* 151* 154* 153*  --  155* 155*  K 3.6   < > 4.3 4.1 3.7 4.3 4.6  --  4.6 4.7  CL 103   < > 106 108 114* 117* 113*  --  113* 117*  CO2 29   < > 28 32 30 32 31  --   --  30  GLUCOSE 138*   < > 149* 164* 124* 126* 114*  --  99 129*  BUN 71*   < > 67* 72* 81* 87* 90*  --  77* 79*  CREATININE 1.92*   < > 1.70* 1.67* 1.83* 1.58* 1.61*  --  1.70* 1.52*  CALCIUM 7.8*   < > 8.1* 8.2* 7.8* 7.8* 8.4*  --   --  8.5*  MG 2.1   < > 2.0 2.2 2.5* 2.4  2.6* 2.7*  --  2.5*  PHOS 4.3  --  4.1 5.1* 4.2  --  5.9*  --   --   --    < > =  values in this interval not displayed.   Estimated Creatinine Clearance: 80.6 mL/min (A) (by C-G formula based on SCr of 1.52 mg/dL (H)).   LIVER Recent Labs  Lab 09/02/18 0409 09/03/18 0322 09/04/18 0332 09/05/18 0227 09/05/18 1411 09/06/18 0435 09/07/18 0320  AST 125* 93* 105* 100*  --  126*  --   ALT 1,403* 1,045* 593* 393*  --  337*  --   ALKPHOS 108 108 88 83  --  91  --   BILITOT 9.2* 8.6* 11.9* 14.3* 11.8* 21.3*  --   PROT 5.4* 5.6* 5.2* 5.2*  --  6.2*  --   ALBUMIN 2.2* 2.2* 2.0* 1.9*  --  2.1*  --   INR 2.10 1.57 2.01 1.87  --  1.66 1.71     INFECTIOUS Recent Labs  Lab 09/01/18 0356 09/04/18 0736 09/04/18 1204 09/05/18 0227 09/06/18 0435  LATICACIDVEN 1.5 1.7  --   --  1.7  PROCALCITON  --   --  2.43 4.82 5.06     ENDOCRINE CBG (last 3)  Recent Labs    09/06/18 2347 09/07/18 0331 09/07/18 0744  GLUCAP 106* 103* 80         IMAGING x48h  - image(s) personally visualized  -   highlighted in bold Ct Abdomen Pelvis Wo Contrast  Result Date: 09/06/2018 CLINICAL DATA:  Pt is septic, s/p chole drain placed 10/20. Pt has continued to run a fever the last several days. Concern for possible infection. EXAM: CT CHEST, ABDOMEN AND PELVIS WITHOUT CONTRAST TECHNIQUE: Multidetector CT imaging of the chest, abdomen and pelvis was performed following the standard protocol without IV contrast. COMPARISON:  Abdomen ultrasound, 09/05/2018. CTs, 07/12/2014 and 07/05/2014. FINDINGS: CT CHEST FINDINGS Cardiovascular: Heart is mildly enlarged. No pericardial effusion. Left and right coronary artery calcifications. Great vessels are normal in caliber. No aortic atherosclerotic calcifications. Mediastinum/Nodes: No neck base or axillary masses or enlarged lymph nodes. No mediastinal or hilar masses or enlarged lymph nodes. Endotracheal tube tip projects 4 cm above the Carina. Right internal  jugular central venous line tip lies at the caval atrial junction. Nasal/orogastric tube passes below the diaphragm into the mid stomach. Lungs/Pleura: Small bilateral pleural effusions. There is dependent opacity in both lower lobes with adjacent ground-glass type opacities. Subtle hazy ground-glass opacity is noted in the posterior right upper lobe and in the posterolateral left upper lobe. There is no interstitial thickening or vascular congestion to suggest pulmonary edema. No lung mass or discrete nodule. No pneumothorax. Musculoskeletal: Developmentally small disks from T3 through T5. No fracture or acute finding. No osteoblastic or osteolytic lesions. CT ABDOMEN PELVIS FINDINGS Hepatobiliary: Liver normal in size with no mass or focal lesion. Gallbladder is been decompressed with a cholecystotomy tube. There is no fluid collection in the gallbladder fossa to suggest a bile leak or abscess. No bile duct dilation. Pancreas: Unremarkable. No pancreatic ductal dilatation or surrounding inflammatory changes. Spleen: Normal in size without focal abnormality. Adrenals/Urinary Tract: No adrenal masses. Kidneys normal in size, orientation and position. No renal masses, stones or hydronephrosis. Normal ureters. Bladder decompressed with a Foley catheter. Stomach/Bowel: Stomach is unremarkable. Small bowel is normal caliber. No wall thickening or inflammation. Mild distention of the right colon. No colonic wall thickening or inflammation. Mild generalized increased stool burden noted in the colon most evident in the right colon. Appendix not discretely visualized. No evidence of appendicitis. Vascular/Lymphatic: Aortic atherosclerosis. No enlarged abdominal or pelvic lymph nodes. Reproductive: Unremarkable. Other: Minimal ascites noted adjacent to  the liver and in the pelvis. Diffuse subcutaneous edema noted along the lower abdomen and pelvis extending to the proximal lower extremities. No abdominal wall hernia.  Musculoskeletal: No acute or significant osseous findings. IMPRESSION: CHEST CT 1. There are areas of ground-glass opacity in the lungs. This may be due to mild airspace edema or infection/inflammation. 2. There is dependent opacity in both lower lobes that is most consistent with atelectasis, associated with small effusions. 3. Mild cardiomegaly. ABDOMEN AND PELVIS CT 1. Status post cholecystotomy for decompression. Tube is well positioned. No gallbladder fossa collection to suggest a bile leak or abscess. 2. Minimal amount of ascites noted adjacent to the liver and in the pelvis. This is nonspecific and along with lower abdominal and pelvic soft tissue edema and small pleural effusions suggests a diffuse edematous state. 3. No acute findings within the abdomen or pelvis. 4. Mild generalized increased stool in the colon. No bowel inflammation or obstruction. Electronically Signed   By: Amie Portland M.D.   On: 09/06/2018 14:33   Ct Chest Wo Contrast  Result Date: 09/06/2018 CLINICAL DATA:  Pt is septic, s/p chole drain placed 10/20. Pt has continued to run a fever the last several days. Concern for possible infection. EXAM: CT CHEST, ABDOMEN AND PELVIS WITHOUT CONTRAST TECHNIQUE: Multidetector CT imaging of the chest, abdomen and pelvis was performed following the standard protocol without IV contrast. COMPARISON:  Abdomen ultrasound, 09/05/2018. CTs, 07/12/2014 and 07/05/2014. FINDINGS: CT CHEST FINDINGS Cardiovascular: Heart is mildly enlarged. No pericardial effusion. Left and right coronary artery calcifications. Great vessels are normal in caliber. No aortic atherosclerotic calcifications. Mediastinum/Nodes: No neck base or axillary masses or enlarged lymph nodes. No mediastinal or hilar masses or enlarged lymph nodes. Endotracheal tube tip projects 4 cm above the Carina. Right internal jugular central venous line tip lies at the caval atrial junction. Nasal/orogastric tube passes below the diaphragm into  the mid stomach. Lungs/Pleura: Small bilateral pleural effusions. There is dependent opacity in both lower lobes with adjacent ground-glass type opacities. Subtle hazy ground-glass opacity is noted in the posterior right upper lobe and in the posterolateral left upper lobe. There is no interstitial thickening or vascular congestion to suggest pulmonary edema. No lung mass or discrete nodule. No pneumothorax. Musculoskeletal: Developmentally small disks from T3 through T5. No fracture or acute finding. No osteoblastic or osteolytic lesions. CT ABDOMEN PELVIS FINDINGS Hepatobiliary: Liver normal in size with no mass or focal lesion. Gallbladder is been decompressed with a cholecystotomy tube. There is no fluid collection in the gallbladder fossa to suggest a bile leak or abscess. No bile duct dilation. Pancreas: Unremarkable. No pancreatic ductal dilatation or surrounding inflammatory changes. Spleen: Normal in size without focal abnormality. Adrenals/Urinary Tract: No adrenal masses. Kidneys normal in size, orientation and position. No renal masses, stones or hydronephrosis. Normal ureters. Bladder decompressed with a Foley catheter. Stomach/Bowel: Stomach is unremarkable. Small bowel is normal caliber. No wall thickening or inflammation. Mild distention of the right colon. No colonic wall thickening or inflammation. Mild generalized increased stool burden noted in the colon most evident in the right colon. Appendix not discretely visualized. No evidence of appendicitis. Vascular/Lymphatic: Aortic atherosclerosis. No enlarged abdominal or pelvic lymph nodes. Reproductive: Unremarkable. Other: Minimal ascites noted adjacent to the liver and in the pelvis. Diffuse subcutaneous edema noted along the lower abdomen and pelvis extending to the proximal lower extremities. No abdominal wall hernia. Musculoskeletal: No acute or significant osseous findings. IMPRESSION: CHEST CT 1. There are areas of ground-glass opacity  in  the lungs. This may be due to mild airspace edema or infection/inflammation. 2. There is dependent opacity in both lower lobes that is most consistent with atelectasis, associated with small effusions. 3. Mild cardiomegaly. ABDOMEN AND PELVIS CT 1. Status post cholecystotomy for decompression. Tube is well positioned. No gallbladder fossa collection to suggest a bile leak or abscess. 2. Minimal amount of ascites noted adjacent to the liver and in the pelvis. This is nonspecific and along with lower abdominal and pelvic soft tissue edema and small pleural effusions suggests a diffuse edematous state. 3. No acute findings within the abdomen or pelvis. 4. Mild generalized increased stool in the colon. No bowel inflammation or obstruction. Electronically Signed   By: Amie Portland M.D.   On: 09/06/2018 14:33   US Abdomen Limited Ruq  Result Date: 09/05/2018 CLINICAL DATA:  Acute cholecystitis. Status post cholecystostomy placement. EXAM: ULTRASOUND ABDOMEN LIMITED RIGHT UPPER QUADRANT COMPARISON:  Ultrasound of August 29, 2018. FINDINGS: Gallbladder: Cholelithiasis is noted with mild gallbladder wall thickening measuring 4 mm. Gallbladder is decompressed secondary to drainage catheter placement. No sonographic Murphy's sign is noted. Common bile duct: Diameter: 2.6 mm which is within normal limits. Liver: No focal lesion identified. Normal echogenicity of hepatic parenchyma is noted. Portal vein is patent on color Doppler imaging with normal direction of blood flow towards the liver. IMPRESSION: Cholelithiasis with mild gallbladder wall thickening is noted. Gallbladder is decompressed secondary to cholecystostomy placement. Electronically Signed   By: Lupita Raider, M.D.   On: 09/05/2018 15:08

## 2018-09-07 NOTE — Progress Notes (Signed)
Pt with changed respiratory pattern. Increased sedation over past hour with use of PRN medications without resolve. Pt lung sounds clear/diminished on assessment. Suctioned patient without resolve. Notified Respiratory to request lavage with little secretions cleared. ELink notified and asked to camera in- believed to be hiccups. TF stopped at this time and turned OGT to LIS. Will continue to monitor closely.

## 2018-09-07 NOTE — Progress Notes (Signed)
Megan Mason, NP with palliative care given update that patient is now a Full Code via text page. 

## 2018-09-07 NOTE — Progress Notes (Signed)
DNR bracelet removed from patient's right wrist.

## 2018-09-07 NOTE — Progress Notes (Signed)
STROKE TEAM PROGRESS NOTE   SUBJECTIVE (INTERVAL HISTORY) His girlfriend and RN are at the bedside. Pt still intubated on precedex. Had MRI and MRA showed right MCA small infarct but right ICA occlusion vs. High grade stenosis.  He is improving, he is following commands such as squeeze fingers  And wiggle toes on the right.    OBJECTIVE Temp:  [99.1 F (37.3 C)-102.2 F (39 C)] 101.8 F (38.8 C) (10/27 1400) Pulse Rate:  [103-124] 122 (10/27 1400) Cardiac Rhythm: Sinus tachycardia (10/27 0800) Resp:  [13-17] 14 (10/27 1400) BP: (100-132)/(74-111) 111/93 (10/27 1400) SpO2:  [95 %-99 %] 97 % (10/27 1517) FiO2 (%):  [30 %] 30 % (10/27 1517) Weight:  [104.1 kg] 104.1 kg (10/27 0500)  Recent Labs  Lab 09/06/18 2053 09/06/18 2347 09/07/18 0331 09/07/18 0744 09/07/18 1135  GLUCAP 98 106* 103* 80 102*   Recent Labs  Lab 09/01/18 0356  09/02/18 0409 09/03/18 0322 09/04/18 0332 09/05/18 0227 09/06/18 0435 09/06/18 1514 09/06/18 1515 09/07/18 0320  NA 138   < > 142 146* 151* 154* 153*  --  155* 155*  K 3.6   < > 4.3 4.1 3.7 4.3 4.6  --  4.6 4.7  CL 103   < > 106 108 114* 117* 113*  --  113* 117*  CO2 29   < > 28 32 30 32 31  --   --  30  GLUCOSE 138*   < > 149* 164* 124* 126* 114*  --  99 129*  BUN 71*   < > 67* 72* 81* 87* 90*  --  77* 79*  CREATININE 1.92*   < > 1.70* 1.67* 1.83* 1.58* 1.61*  --  1.70* 1.52*  CALCIUM 7.8*   < > 8.1* 8.2* 7.8* 7.8* 8.4*  --   --  8.5*  MG 2.1   < > 2.0 2.2 2.5* 2.4 2.6* 2.7*  --  2.5*  PHOS 4.3  --  4.1 5.1* 4.2  --  5.9*  --   --   --    < > = values in this interval not displayed.   Recent Labs  Lab 09/03/18 0322 09/04/18 0332 09/05/18 0227 09/05/18 1411 09/06/18 0435 09/07/18 0852  AST 93* 105* 100*  --  126* 144*  ALT 1,045* 593* 393*  --  337* 264*  ALKPHOS 108 88 83  --  91 73  BILITOT 8.6* 11.9* 14.3* 11.8* 21.3* 24.5*  PROT 5.6* 5.2* 5.2*  --  6.2* 5.6*  ALBUMIN 2.2* 2.0* 1.9*  --  2.1* 1.9*   Recent Labs  Lab  09/03/18 0322 09/04/18 0332 09/05/18 0227 09/06/18 0435 09/06/18 1515 09/07/18 0320  WBC 17.9* 14.6* 18.0* 23.1*  --  21.6*  NEUTROABS 15.0*  --   --   --   --   --   HGB 14.7 14.7 15.2 16.6 17.7* 14.9  HCT 45.0 44.8 45.8 53.0* 52.0 47.9  MCV 85.2 84.1 86.3 87.9  --  88.5  PLT 131* 92* 121* 99*  --  91*   No results for input(s): CKTOTAL, CKMB, CKMBINDEX, TROPONINI in the last 168 hours. Recent Labs    09/05/18 0227 09/06/18 0435 09/07/18 0320  LABPROT 21.3* 19.4* 19.8*  INR 1.87 1.66 1.71   No results for input(s): COLORURINE, LABSPEC, PHURINE, GLUCOSEU, HGBUR, BILIRUBINUR, KETONESUR, PROTEINUR, UROBILINOGEN, NITRITE, LEUKOCYTESUR in the last 72 hours.  Invalid input(s): APPERANCEUR     Component Value Date/Time   CHOL 125 09/05/2018 0227  TRIG 208 (H) 09/05/2018 0227   HDL <10 (L) 09/05/2018 0227   CHOLHDL NOT CALCULATED 09/05/2018 0227   VLDL 42 (H) 09/05/2018 0227   LDLCALC NOT CALCULATED 09/05/2018 0227   Lab Results  Component Value Date   HGBA1C 6.2 (H) 09/05/2018   No results found for: LABOPIA, COCAINSCRNUR, LABBENZ, AMPHETMU, THCU, LABBARB  No results for input(s): ETH in the last 168 hours.  IMAGING   Dg Abd 1 View 08/15/2018 IMPRESSION:  NG tube tip in the mid stomach. Moderate gas and stool within the colon.   Ct Head Wo Contrast 09/04/2018 IMPRESSION:  Small area of hypoattenuation with focal loss of gray-white differentiation in the right parietal lobe, suspected recent acute to subacute infarction. No associated hemorrhage or mass effect.    Mr Maxine Glenn Head Wo Contrast 09/05/2018 IMPRESSION:   MRI HEAD:  1. 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  2. Borderline parenchymal brain volume loss for age.  3. Minimal chronic small vessel ischemic changes.   MRA HEAD:  1. 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 Recommend CTA head  and neck.     Carotid Doppler   09/05/2018 Limited evaluation of right carotid artery system due to central line placement.  There is evidence of a thumped right ICA waveform, suggestive of possible distal ICA occlusion.  Left internal carotid artery evaluation is suggestive of 1-39% stenosis.  Left vertebral artery is patent with antegrade flow. Preliminary results discussed with Dr. Roda Shutters.   TTE  - Mild to moderate LV dilation with EF 20%, diffuse hypokinesis.   Restrictive diastolic function. Mildly dilated RV with mildly   decreased systolic function. No significant valvular   abnormalities.   LE venous doppler Right: There is no evidence of deep vein thrombosis in the lower extremity. However, portions of this examination were limited- see technologist comments above. No cystic structure found in the popliteal fossa. Left: There is no evidence of deep vein thrombosis in the lower extremity. However, portions of this examination were limited- see technologist comments above. No cystic structure found in the popliteal fossa.   PHYSICAL EXAM  Temp:  [99.1 F (37.3 C)-102.2 F (39 C)] 101.8 F (38.8 C) (10/27 1400) Pulse Rate:  [103-124] 122 (10/27 1400) Resp:  [13-17] 14 (10/27 1400) BP: (100-132)/(74-111) 111/93 (10/27 1400) SpO2:  [95 %-99 %] 97 % (10/27 1517) FiO2 (%):  [30 %] 30 % (10/27 1517) Weight:  [104.1 kg] 104.1 kg (10/27 0500)  General - well nourished, well developed, intubated on precedex, opens eyes and grimaces to sternal rub, following commands to squeeze fingers and wiggle toes on left.  Ophthalmologic - fundi not visualized due to noncooperation.    Cardiovascular - regular rate and rhythm.   Neuro - intubated on Precedex, opens eyes to stim, scleric icterus.  Pupil 2 mm bilaterally, sluggish to light, both eyes in upgaze position, +corneals, blinks to threat on right, gag and cough weakly present today.  Squeezes fingers to command and wiggles toes to  command on the right only.DTRs hyporeflexic.   ASSESSMENT/PLAN Cody Patterson is a 42 y.o. male with history of CHF, cardiomyopathy with EF 10-20%, polysubstance abuse, paroxysmal A. fib not on [facility] admitted for shortness of breath.  Found to have bacteremia, fever, sepsis, respiratory failure requiring intubation and antibiotics, abnormal LFT with elevated INR and bilirubin, acute cholecystitis s/p percutaneous cholecystectomy, THC and amphetamine abuse, A. fib RVR and V. Tach  resolved with amiodarone IV drip.09/04/18 had episode of agitation, tachypnea, tachycardia, hypertension, and abnormal breathing pattern during ventilation weaning, stat CT showed right parietal subacute small to moderate sized infarct.    Stroke:  right MCA small cortical infarcts, embolic pattern, source not certain, however, combination of many potential causes likely including  A.fib RVR not on AC (but INR high), cardiomyopathy with low EF, and septic emboli   Resultant coma  CT head right parietal small subacute infarct  MRI  Right MCA cortical small infarcts  MRA  Right ICA occlusion vs. High grade stenosis  Carotid Doppler - Limited evaluation of right carotid artery system due to central line placement. There is evidence of a thumped right ICA waveform, suggestive of possible distal ICA occlusion. Left internal carotid artery evaluation is suggestive of 1-39% stenosis. Left vertebral artery is patent with antegrade flow. Technologist discussed preliminary results with Dr Roda Shutters Friday.  2D Echo  EF 20%  LE venous doppler no DVT  UDS - appears to have been cancelled  LDL NTC due to low HDL  HgbA1c 6.2  SCDs for VTE prophylaxis  No antithrombotic prior to admission, now on No antithrombotic given high INR. Consider anticoagulation once INR < 1.5 (1.66 on Saturday) (INR Sunday - 1.71)  Ongoing aggressive stroke risk factor management  Therapy recommendations:  pending  Disposition:  pending  Right ICA  occlusion vs high grade stenosis  MRA suggest proximal ICA occlusion with distal recon but slow flow  CUS - see above  BP goal 120-140  Not candidate for intervention at this time  Once Cre normalized, may consider CTA head and neck (1.52 on Sunday)  Sepsis and septic shock  Blood culture NGTD  US abdomen suggestive of cholecystitis  S/p percutaneous drain - bile culture NTGD  Leukocytosis WBC 14.6->18.0  BP on the low side  On levophed  Cardiomyopathy   Cardiogenic shock  Cardiology on board  EF 20%  Not on AC due to high INR  Liver failure  Sclera jaundice  Total bilirubin 11.9->14.3  AST ALT elevated  INR 2.1->1.57->2.01->1.87-> 1.66-> 1.71  AKI  On IVF  Cre 1.83->1.58 -> 1.61-> 1.52  May consider CTA head and neck once Cre normalized  Hypotension . On the low side . On levophed  BP goal 120-140 given right ICA occlusion vs. High grade stenosis  Other Stroke Risk Factors  Obesity, Body mass index is 31.13 kg/m.   Substance abuse - amphetamine and THC positive on UDS  Other Active Problems  Hypernatremia - 153 -> 1.55  Pt is now a full code (changed from DNR by spouse)  Hospital day # 11  This patient is critically ill due to stroke, sepsis, septic shock, cardiogenic shock, cardiomyopathy, liver failure and at significant risk of neurological worsening, death form recurrent stroke, sepsis, heart failure, cardiac arrest, cirrhosis. This patient's care requires constant monitoring of vital signs, hemodynamics, respiratory and cardiac monitoring, review of multiple databases, neurological assessment, discussion with family, other specialists and medical decision making of high complexity. I spent 35 minutes of neurocritical care time in the care of this patient.     To contact Stroke Continuity provider, please refer to [email or web address]. After hours, contact General Neurology

## 2018-09-07 NOTE — Progress Notes (Signed)
RN updatd me about change in code statu - full code now  Also, called Dr Leone Payor for GI regarding jaundice     SIGNATURE    Dr. Kalman Shan, M.D., F.C.C.P,  Pulmonary and Critical Care Medicine Staff Physician, [facility] Director - Interstitial Lung Disease  Program  Pulmonary Fibrosis [facility] [place], [place], [address]  Pager: [phone], If no answer or between  15:00h - 7:00h: call [phone] Telephone: [phone]  3:24 PM 09/07/2018

## 2018-09-07 NOTE — Progress Notes (Signed)
GI MD present at bedside.  Spouse, Marla at bedside.  Updates provided and questions answered to verbal satisfaction.  Patient having some hiccups.  No change in respiratory status. 

## 2018-09-07 NOTE — Progress Notes (Signed)
Per Dr. Marchelle Gearing, ok to resume tube feeds at 2ml/hr and advance Q4hrs to goal of 71ml/hr.

## 2018-09-07 NOTE — Progress Notes (Signed)
Notified Dr. Marchelle Gearing of critical bilirubin value of 24.5 and of AST and ALT levels.  No new orders received.

## 2018-09-08 ENCOUNTER — Inpatient Hospital Stay (HOSPITAL_COMMUNITY): Payer: Medicaid Other

## 2018-09-08 ENCOUNTER — Inpatient Hospital Stay: Payer: Self-pay

## 2018-09-08 ENCOUNTER — Encounter (HOSPITAL_COMMUNITY): Payer: Self-pay | Admitting: *Deleted

## 2018-09-08 ENCOUNTER — Other Ambulatory Visit: Payer: Self-pay

## 2018-09-08 DIAGNOSIS — R7881 Bacteremia: Secondary | ICD-10-CM

## 2018-09-08 DIAGNOSIS — I5023 Acute on chronic systolic (congestive) heart failure: Secondary | ICD-10-CM

## 2018-09-08 LAB — GLUCOSE, CAPILLARY
GLUCOSE-CAPILLARY: 119 mg/dL — AB (ref 70–99)
GLUCOSE-CAPILLARY: 131 mg/dL — AB (ref 70–99)
GLUCOSE-CAPILLARY: 97 mg/dL (ref 70–99)
Glucose-Capillary: 109 mg/dL — ABNORMAL HIGH (ref 70–99)
Glucose-Capillary: 120 mg/dL — ABNORMAL HIGH (ref 70–99)

## 2018-09-08 LAB — HAPTOGLOBIN: Haptoglobin: 11 mg/dL — ABNORMAL LOW (ref 34–200)

## 2018-09-08 LAB — HEPATITIS PANEL, ACUTE
HCV AB: 0.2 {s_co_ratio} (ref 0.0–0.9)
HEP A IGM: NEGATIVE
HEP B C IGM: NEGATIVE
Hepatitis B Surface Ag: NEGATIVE

## 2018-09-08 LAB — BASIC METABOLIC PANEL
ANION GAP: 6 (ref 5–15)
ANION GAP: 4 — AB (ref 5–15)
BUN: 73 mg/dL — ABNORMAL HIGH (ref 6–20)
BUN: 66 mg/dL — ABNORMAL HIGH (ref 6–20)
CO2: 32 mmol/L (ref 22–32)
CO2: 29 mmol/L (ref 22–32)
CREATININE: 1.09 mg/dL (ref 0.61–1.24)
Calcium: 8.4 mg/dL — ABNORMAL LOW (ref 8.9–10.3)
Calcium: 8.4 mg/dL — ABNORMAL LOW (ref 8.9–10.3)
Chloride: 120 mmol/L — ABNORMAL HIGH (ref 98–111)
Chloride: 121 mmol/L — ABNORMAL HIGH (ref 98–111)
Creatinine, Ser: 1.31 mg/dL — ABNORMAL HIGH (ref 0.61–1.24)
GFR calc non Af Amer: >60 mL/min (ref >60)
Glucose, Bld: 134 mg/dL — ABNORMAL HIGH (ref 70–99)
Glucose, Bld: 124 mg/dL — ABNORMAL HIGH (ref 70–99)
Potassium: 4.5 mmol/L (ref 3.5–5.1)
Potassium: 4.2 mmol/L (ref 3.5–5.1)
SODIUM: 158 mmol/L — AB (ref 135–145)
SODIUM: 154 mmol/L — AB (ref 135–145)

## 2018-09-08 LAB — BASIC METABOLIC PANEL WITH GFR
Anion gap: 6 (ref 5–15)
BUN: 69 mg/dL — ABNORMAL HIGH (ref 6–20)
CO2: 32 mmol/L (ref 22–32)
Calcium: 8.3 mg/dL — ABNORMAL LOW (ref 8.9–10.3)
Chloride: 118 mmol/L — ABNORMAL HIGH (ref 98–111)
Creatinine, Ser: 1.13 mg/dL (ref 0.61–1.24)
GFR calc Af Amer: >60 mL/min (ref >60)
GFR calc non Af Amer: >60 mL/min (ref >60)
Glucose, Bld: 160 mg/dL — ABNORMAL HIGH (ref 70–99)
Potassium: 4.1 mmol/L (ref 3.5–5.1)
Sodium: 156 mmol/L — ABNORMAL HIGH (ref 135–145)

## 2018-09-08 LAB — PROTIME-INR
INR: 1.64
Prothrombin Time: 19.3 seconds — ABNORMAL HIGH (ref 11.4–15.2)

## 2018-09-08 LAB — CULTURE, BLOOD (ROUTINE X 2)
CULTURE: NO GROWTH
Culture: NO GROWTH
SPECIAL REQUESTS: ADEQUATE
Special Requests: ADEQUATE

## 2018-09-08 LAB — CBC
HEMATOCRIT: 48.2 % (ref 39.0–52.0)
HEMOGLOBIN: 14.7 g/dL (ref 13.0–17.0)
MCH: 27.3 pg (ref 26.0–34.0)
MCHC: 30.5 g/dL (ref 30.0–36.0)
MCV: 89.6 fL (ref 80.0–100.0)
Platelets: 111 10*3/uL — ABNORMAL LOW (ref 150–400)
RBC: 5.38 MIL/uL (ref 4.22–5.81)
RDW: 24.4 % — ABNORMAL HIGH (ref 11.5–15.5)
WBC: 22.9 10*3/uL — ABNORMAL HIGH (ref 4.0–10.5)
nRBC: 0.1 % (ref 0.0–0.2)

## 2018-09-08 LAB — COOXEMETRY PANEL
Carboxyhemoglobin: 1.7 % — ABNORMAL HIGH (ref 0.5–1.5)
METHEMOGLOBIN: 1.2 % (ref 0.0–1.5)
O2 Saturation: 68.7 %
Total hemoglobin: 15.2 g/dL (ref 12.0–16.0)

## 2018-09-08 LAB — ECHOCARDIOGRAM COMPLETE
Height: 72 in
WEIGHTICAEL: 3700.2 [oz_av]

## 2018-09-08 LAB — CERULOPLASMIN: CERULOPLASMIN: 28.1 mg/dL (ref 16.0–31.0)

## 2018-09-08 LAB — HEPATIC FUNCTION PANEL
ALBUMIN: 1.9 g/dL — AB (ref 3.5–5.0)
ALK PHOS: 69 U/L (ref 38–126)
ALT: 222 U/L — AB (ref 0–44)
AST: 131 U/L — AB (ref 15–41)
BILIRUBIN TOTAL: 25.1 mg/dL — AB (ref 0.3–1.2)
Bilirubin, Direct: 16.2 mg/dL — ABNORMAL HIGH (ref 0.0–0.2)
Indirect Bilirubin: 8.9 mg/dL — ABNORMAL HIGH (ref 0.3–0.9)
TOTAL PROTEIN: 5.4 g/dL — AB (ref 6.5–8.1)

## 2018-09-08 LAB — ANTI-SMITH ANTIBODY

## 2018-09-08 LAB — HEPATITIS B E ANTIGEN: HEP B E AG: NEGATIVE

## 2018-09-08 LAB — HEPATITIS A ANTIBODY, TOTAL: HEP A TOTAL AB: NEGATIVE

## 2018-09-08 LAB — HEPATITIS B CORE ANTIBODY, TOTAL: Hep B Core Total Ab: NEGATIVE

## 2018-09-08 LAB — HEPATITIS B SURFACE ANTIBODY, QUANTITATIVE: Hepatitis B-Post: <3.1 m[IU]/mL — ABNORMAL LOW (ref >9.9)

## 2018-09-08 LAB — MAGNESIUM: Magnesium: 2.7 mg/dL — ABNORMAL HIGH (ref 1.7–2.4)

## 2018-09-08 LAB — HEPATITIS B E ANTIBODY: Hep B E Ab: NEGATIVE

## 2018-09-08 MED ORDER — SODIUM CHLORIDE 0.9% FLUSH: 10.0000 mL | INTRAVENOUS | Status: DC | PRN

## 2018-09-08 MED ORDER — CHLORHEXIDINE GLUCONATE CLOTH 2 % EX PADS
6.0000 | MEDICATED_PAD | Freq: Every day | CUTANEOUS | Status: DC
  Administered 2018-09-09 – 2018-09-13 (×5): 6 via TOPICAL

## 2018-09-08 MED ORDER — FREE WATER
300.0000 mL | Status: DC
  Administered 2018-09-08 – 2018-09-13 (×30): 300 mL

## 2018-09-08 MED ORDER — CLONAZEPAM 0.5 MG PO TBDP
1.0000 mg | ORAL_TABLET | Freq: Two times a day (BID) | ORAL | Status: DC
  Administered 2018-09-08 – 2018-09-09 (×3): 1 mg
  Filled 2018-09-08 (×2): qty 2

## 2018-09-08 MED ORDER — CLONAZEPAM 0.5 MG PO TBDP
1.0000 mg | ORAL_TABLET | Freq: Two times a day (BID) | ORAL | Status: DC
  Filled 2018-09-08: qty 2

## 2018-09-08 MED ORDER — CLONAZEPAM 0.1 MG/ML ORAL SUSPENSION
1.0000 mg | Freq: Two times a day (BID) | ORAL | Status: DC
  Filled 2018-09-08: qty 10

## 2018-09-08 MED ORDER — DEXTROSE 5 % IV SOLN
INTRAVENOUS | Status: DC
  Administered 2018-09-08 – 2018-09-09 (×3): via INTRAVENOUS

## 2018-09-08 MED ORDER — SODIUM CHLORIDE 0.9% FLUSH
10.0000 mL | Freq: Two times a day (BID) | INTRAVENOUS | Status: DC
  Administered 2018-09-08 – 2018-09-14 (×10): 10 mL

## 2018-09-08 NOTE — Progress Notes (Addendum)
20 mL's of Fentanyl wasted with Kristian Covey, RN in narcotics container.

## 2018-09-08 NOTE — Progress Notes (Signed)
NAME:  Cody Patterson, MRN:  [id number], DOB:  April 16, 1976, LOS: 12 ADMISSION DATE:  09/01/2018, CONSULTATION DATE:  08/31/2018 REFERRING MD:  Derek Mound, CHIEF COMPLAINT:  Cardiogenic shock   Brief History   42 year old man with history of 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 Presented to [facility] with increasing dyspnea. Acute decompensation with hypotension and severe respiratory distress requiring intubation. Transferred to [facility] for management of sepsis, advanced heart failure.  Growing H influenza in sputum.  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  Past Medical History  Known cardiomyopathy and drug abuse (THC and methamphetamines)  Significant Hospital Events   10/16- Intubated at [facility] 10/17- Worsening shock, maxed on pressors, right heart cath indicative of distributive shock, high output failure 10/18-Started on milrinone, lasix for reduced cardiac output. PA numbers show worsening CO and high wedge 10/19- Started making urine, pressors weaning down, continues on milrinone. Surgery consulted for cholecystitis 10/20- Mucus plug overnight requiring bag, lavage. Went into afib.  Perc chole drain placed by IR 10/23 Start SBT trials, failed due to tachypnea 10/24 [facility] type respirations > CT head which showed stroke 10/26 -  spiking fevers 103 - swinging fevers since 09/03/18. Rising bilirubin. Rpt [place] c/w Acute choley - per IR yesterday - GB drain working well. CT chest/abd - GB drain ok. Stoool + . No abscess CCS suspecting this is cholestasis; not a candidate for surgery. Total Bili > IndirectBili. Remains on amio gtt, milrinone, Of levophed . MADE DNR by Palliative care 09/07/18 - fever + by peak of curve < 103F WBC down trend.   Consults:  date of consult/date signed off & final recs:  10/17- Cardiology  10/19- Surgery signed on 09/02/2017 10/24 Neurology 10/26 - pallaitive  Procedures (surgical and bedside):  10/16 [facility] R subclavian CVC 10/16 [facility] L femoral arterial line >> out  10/17 PA catheter >> 10/28 10/20 Perc chole drain  Significant Diagnostic Tests:  Echocardiogram 10/17- Mild to moderate LV dilation with EF 20%, diffuse hypokinesis.  Restrictive diastolic function. Mildly dilated RV with mildly decreased systolic function. No significant valvular abnormalities Abdominal ultrasound 08/29/2018- severe gallbladder thickening, sludge, cholelithiasis, small volume of perihepatic, pericholecystic and perinephric free fluid. UDS [facility] 10/16- Amphetamines, THC [place] 10/18 Initial numbers RA 10 PA 46/33 (41) PCWP 10 Thermo CO/CI  15.2/7.0 Fick 14.1/6.5 (co-ox 86%) SVR 231 CT head 10/24 > small area of hypoattenuation in right parietal lobe suspicious for recent acute to subacute infarct. 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  Occluded R ICA, slow flow R MCA.  CT chest / abd 10/24 >> mild GGO, bibasal consolidation, perc drain well positioned , no other abd cause of fever  Micro Data:  Bcx 10/17 >> ng Trach aspirate 10/17- H influenza MRSA PR 10/16 - positive Blood 10/22 - neg as of 10/27 Trach aspirate 10/25 - moderate yeast   Antimicrobials:  Vancomycin 10/17 > 10/21. 10/24 >  Meropenen 10/17 > Aztreonam 10/17>> off  SUBJECTIVE/OVERNIGHT/INTERVAL HX  Defervesced but still has low-grade fever Critically ill, intubated, sedated on high-dose fentanyl drip. Remains on amiodarone Levophed drip    Objective   Blood pressure 119/87, pulse (!) 102, temperature 99.9 F (37.7 C), resp. rate (!) 0, height 6' (1.829 m), weight 104.9 kg,  SpO2 98 %.    Vent Mode: PRVC FiO2 (%):  [30 %] 30 % Set Rate:  [14 bmp] 14  bmp Vt Set:  [540 mL] 540 mL PEEP:  [5 cmH20] 5 cmH20 Plateau Pressure:  [13 cmH20-17 cmH20] 13 cmH20   Intake/Output Summary (Last 24 hours) at 09/08/2018 0932 Last data filed at 09/08/2018 0900 Gross per 24 hour  Intake 4725.44 ml  Output 3105 ml  Net 1620.44 ml   Filed Weights   09/06/18 0500 09/07/18 0500 09/08/18 0500  Weight: 105.7 kg 104.1 kg 104.9 kg    Young man, acutely ill, orally intubated, sedated RA SS -4, unresponsive on fentanyl drip, pupils 3 mm bilaterally equally reactive to light. No pallor, icterus present Decreased breath sounds bilateral, minimal secretions S1-S2 tacky Soft and nontender abdomen, percutaneous drain with yellow bile, clear 2+ edema  Chest x-ray 10/28 personally reviewed which shows clearing of infiltrates, groundglass infiltrates noted on CT not seen on this film      Assessment & Plan:   #Shock - Cardiogenic (AoC sCHF with cardiomyopathy and EF 10%), septic combination.  Shock physiology resolved and has been off all pressors up until evening of 10/24 when required low dose norepi to support BP given new findings of MCA stroke. Severe cardiomyopathy, noncompliant. V tach, A. Fib (resolved with amiodarone).   PLAN - per cards; amio gtt and milrionone Gtt  -Not a candidate for LVAD due to substance abuse hx.  - SBP > 110 , taper Levophed to off   #Acute respiratory failure secondary to pulmonary edema and H. influenzae pneumonia. -Weaning limited by agitation and tachypnea   -Initiate spontaneous breathing trials -Discussed tracheostomy with family   #Severe sepsis-H. influenzae pneumonia, +/ Acute cholecystitis. Procalcitonin trended down to 2.4 on 10/24 then rising again  PLAN Continue merrem and vanc  Discontinue lines and if still febrile then contact ID, cultures negative so far,    #Acute metabolic encephalopathy-related to sedation + ICU delirium + new stroke.  PLAN fent gtt RASS goal 0 to -2 Add low-dose  clonazepam 1 mg twice daily, Precedex did not work well earlier  [employer] MCA stroke. - Neurology following. -  maintain SBP > 110.  #AKI - improving  -  Continue to follow urine output and creatinine. - Diuresis as able per cards  #Hypernatremia. - free water increased to 300 every 4 -Add D5W at 50/h  #Elevated LFTs,Acute cholecystitis - s/p perc drain 10/20. Etiology slight unclear, GI input appreciated, seems to be direct bilirubinemia Favor shock liver although trend a little atypical, drain seems to be working well   PLAN - Continue drain x 4 - 6 weeks then can determine if surgery will be necessary. -Follow LFT - check hepatitis virus panel, ANA, ANti smith, ceruloplasmin    Disposition / Summary of Today's Plan 09/08/18    Try to control his agitation with combination of clonazepam, Precedex and fentanyl and assess weaning.  Feel that extubation will be suboptimal at best even if other parameters improved due to his recent stroke and will need to get clear decision on reintubation/tracheostomy before proceeding. He has defervesced, line sepsis important consideration here    Best Practice    Code Status: DNR revoked by wife Family Communication:  Wife girlfriend and brother at bedside.  Decision point here is whether we will proceed with tracheostomy or not.  Wife states that he would not want prolonged life support but is conflicted since he is mentally intact.  May be  best to try and take his input if possible    ATTESTATION & SIGNATURE   The patient is critically ill with multiple organ systems failure and requires high complexity decision making for assessment and support, frequent evaluation and titration of therapies, application of advanced monitoring technologies and extensive interpretation of multiple databases. Critical Care Time devoted to patient care services described in this note independent of APP/resident  time is 35 minutes.   Cyril Mourning MD.  Tonny Bollman. [facility] Pager [phone] If no response call [phone]   09/08/2018

## 2018-09-08 NOTE — Progress Notes (Signed)
Progress Note   Subjective  Patient has had severe, also with short run of V tach overnight. On antibiotics and amiodarone, pressors resumed overnight for some hypotension. Labs today show majority of bilirubinemia is direct   Objective   Vital signs in last 24 hours: Temp:  [98.1 F (36.7 C)-102 F (38.9 C)] 100 F (37.8 C) (10/28 0735) Pulse Rate:  [96-122] 100 (10/28 0735) Resp:  [0-20] 14 (10/28 0735) BP: (100-127)/(78-102) 118/92 (10/28 0735) SpO2:  [96 %-99 %] 98 % (10/28 0739) FiO2 (%):  [30 %] 30 % (10/28 0739) Weight:  [104.9 kg] 104.9 kg (10/28 0500) Last BM Date: 08/15/2018 General:    white male intubated, icteric Heart:  Tachy, regular, hard to appreciate any  murmurs Lungs: Respirations even and unlabored Abdomen:  Soft, nondistended.  Extremities:  (+) mild edema. Skint. Jaundiced Neuro - sedated  Intake/Output from previous day: 10/27 0701 - 10/28 0700 In: 4527.1 [I.V.:1817.1; NG/GT:2250; IV Piggyback:450] Out: 3345 [Urine:3255; Emesis/NG output:25; Drains:65] Intake/Output this shift: No intake/output data recorded.  Lab Results: Recent Labs    09/06/18 0435 09/06/18 1515 09/07/18 0320 09/08/18 0335  WBC 23.1*  --  21.6* 22.9*  HGB 16.6 17.7* 14.9 14.7  HCT 53.0* 52.0 47.9 48.2  PLT 99*  --  91* 111*   BMET Recent Labs    09/06/18 0435 09/06/18 1515 09/07/18 0320 09/08/18 0335  NA 153* 155* 155* 158*  K 4.6 4.6 4.7 4.5  CL 113* 113* 117* 120*  CO2 31  --  30 32  GLUCOSE 114* 99 129* 134*  BUN 90* 77* 79* 73*  CREATININE 1.61* 1.70* 1.52* 1.31*  CALCIUM 8.4*  --  8.5* 8.4*   LFT Recent Labs    09/08/18 0335  PROT 5.4*  ALBUMIN 1.9*  AST 131*  ALT 222*  ALKPHOS 69  BILITOT 25.1*  BILIDIR 16.2*  IBILI 8.9*   PT/INR Recent Labs    09/07/18 0320 09/08/18 0335  LABPROT 19.8* 19.3*  INR 1.71 1.64    Studies/Results: Ct Abdomen Pelvis Wo Contrast  Result Date: 09/06/2018 CLINICAL DATA:  Pt is septic, s/p chole  drain placed 10/20. Pt has continued to run a fever the last several days. Concern for possible infection. EXAM: CT CHEST, ABDOMEN AND PELVIS WITHOUT CONTRAST TECHNIQUE: Multidetector CT imaging of the chest, abdomen and pelvis was performed following the standard protocol without IV contrast. COMPARISON:  Abdomen ultrasound, 09/05/2018. CTs, 07/12/2014 and 07/05/2014. FINDINGS: CT CHEST FINDINGS Cardiovascular: Heart is mildly enlarged. No pericardial effusion. Left and right coronary artery calcifications. Great vessels are normal in caliber. No aortic atherosclerotic calcifications. Mediastinum/Nodes: No neck base or axillary masses or enlarged lymph nodes. No mediastinal or hilar masses or enlarged lymph nodes. Endotracheal tube tip projects 4 cm above the Carina. Right internal jugular central venous line tip lies at the caval atrial junction. Nasal/orogastric tube passes below the diaphragm into the mid stomach. Lungs/Pleura: Small bilateral pleural effusions. There is dependent opacity in both lower lobes with adjacent ground-glass type opacities. Subtle hazy ground-glass opacity is noted in the posterior right upper lobe and in the posterolateral left upper lobe. There is no interstitial thickening or vascular congestion to suggest pulmonary edema. No lung mass or discrete nodule. No pneumothorax. Musculoskeletal: Developmentally small disks from T3 through T5. No fracture or acute finding. No osteoblastic or osteolytic lesions. CT ABDOMEN PELVIS FINDINGS Hepatobiliary: Liver normal in size with no mass or focal lesion. Gallbladder is been decompressed with a cholecystotomy  tube. There is no fluid collection in the gallbladder fossa to suggest a bile leak or abscess. No bile duct dilation. Pancreas: Unremarkable. No pancreatic ductal dilatation or surrounding inflammatory changes. Spleen: Normal in size without focal abnormality. Adrenals/Urinary Tract: No adrenal masses. Kidneys normal in size, orientation  and position. No renal masses, stones or hydronephrosis. Normal ureters. Bladder decompressed with a Foley catheter. Stomach/Bowel: Stomach is unremarkable. Small bowel is normal caliber. No wall thickening or inflammation. Mild distention of the right colon. No colonic wall thickening or inflammation. Mild generalized increased stool burden noted in the colon most evident in the right colon. Appendix not discretely visualized. No evidence of appendicitis. Vascular/Lymphatic: Aortic atherosclerosis. No enlarged abdominal or pelvic lymph nodes. Reproductive: Unremarkable. Other: Minimal ascites noted adjacent to the liver and in the pelvis. Diffuse subcutaneous edema noted along the lower abdomen and pelvis extending to the proximal lower extremities. No abdominal wall hernia. Musculoskeletal: No acute or significant osseous findings. IMPRESSION: CHEST CT 1. There are areas of ground-glass opacity in the lungs. This may be due to mild airspace edema or infection/inflammation. 2. There is dependent opacity in both lower lobes that is most consistent with atelectasis, associated with small effusions. 3. Mild cardiomegaly. ABDOMEN AND PELVIS CT 1. Status post cholecystotomy for decompression. Tube is well positioned. No gallbladder fossa collection to suggest a bile leak or abscess. 2. Minimal amount of ascites noted adjacent to the liver and in the pelvis. This is nonspecific and along with lower abdominal and pelvic soft tissue edema and small pleural effusions suggests a diffuse edematous state. 3. No acute findings within the abdomen or pelvis. 4. Mild generalized increased stool in the colon. No bowel inflammation or obstruction. Electronically Signed   By: Amie Portland M.D.   On: 09/06/2018 14:33   Dg Abd 1 View  Result Date: 09/07/2018 CLINICAL DATA:  Enteric tube placement. Cooling blanket could not be removed for image. EXAM: ABDOMEN - 1 VIEW COMPARISON:  None. FINDINGS: Enteric tube courses through the  stomach with tip in the midline likely over the distal stomach. Percutaneous pigtail drainage catheter over the right upper quadrant. Bowel gas pattern is nonobstructive. Visualized lung bases are within normal. IMPRESSION: Nonobstructive bowel gas pattern. Nasogastric tube with tip over the distal stomach in the midline abdomen. Electronically Signed   By: Elberta Fortis M.D.   On: 09/07/2018 23:52   Ct Chest Wo Contrast  Result Date: 09/06/2018 CLINICAL DATA:  Pt is septic, s/p chole drain placed 10/20. Pt has continued to run a fever the last several days. Concern for possible infection. EXAM: CT CHEST, ABDOMEN AND PELVIS WITHOUT CONTRAST TECHNIQUE: Multidetector CT imaging of the chest, abdomen and pelvis was performed following the standard protocol without IV contrast. COMPARISON:  Abdomen ultrasound, 09/05/2018. CTs, 07/12/2014 and 07/05/2014. FINDINGS: CT CHEST FINDINGS Cardiovascular: Heart is mildly enlarged. No pericardial effusion. Left and right coronary artery calcifications. Great vessels are normal in caliber. No aortic atherosclerotic calcifications. Mediastinum/Nodes: No neck base or axillary masses or enlarged lymph nodes. No mediastinal or hilar masses or enlarged lymph nodes. Endotracheal tube tip projects 4 cm above the Carina. Right internal jugular central venous line tip lies at the caval atrial junction. Nasal/orogastric tube passes below the diaphragm into the mid stomach. Lungs/Pleura: Small bilateral pleural effusions. There is dependent opacity in both lower lobes with adjacent ground-glass type opacities. Subtle hazy ground-glass opacity is noted in the posterior right upper lobe and in the posterolateral left upper lobe. There is no  interstitial thickening or vascular congestion to suggest pulmonary edema. No lung mass or discrete nodule. No pneumothorax. Musculoskeletal: Developmentally small disks from T3 through T5. No fracture or acute finding. No osteoblastic or osteolytic  lesions. CT ABDOMEN PELVIS FINDINGS Hepatobiliary: Liver normal in size with no mass or focal lesion. Gallbladder is been decompressed with a cholecystotomy tube. There is no fluid collection in the gallbladder fossa to suggest a bile leak or abscess. No bile duct dilation. Pancreas: Unremarkable. No pancreatic ductal dilatation or surrounding inflammatory changes. Spleen: Normal in size without focal abnormality. Adrenals/Urinary Tract: No adrenal masses. Kidneys normal in size, orientation and position. No renal masses, stones or hydronephrosis. Normal ureters. Bladder decompressed with a Foley catheter. Stomach/Bowel: Stomach is unremarkable. Small bowel is normal caliber. No wall thickening or inflammation. Mild distention of the right colon. No colonic wall thickening or inflammation. Mild generalized increased stool burden noted in the colon most evident in the right colon. Appendix not discretely visualized. No evidence of appendicitis. Vascular/Lymphatic: Aortic atherosclerosis. No enlarged abdominal or pelvic lymph nodes. Reproductive: Unremarkable. Other: Minimal ascites noted adjacent to the liver and in the pelvis. Diffuse subcutaneous edema noted along the lower abdomen and pelvis extending to the proximal lower extremities. No abdominal wall hernia. Musculoskeletal: No acute or significant osseous findings. IMPRESSION: CHEST CT 1. There are areas of ground-glass opacity in the lungs. This may be due to mild airspace edema or infection/inflammation. 2. There is dependent opacity in both lower lobes that is most consistent with atelectasis, associated with small effusions. 3. Mild cardiomegaly. ABDOMEN AND PELVIS CT 1. Status post cholecystotomy for decompression. Tube is well positioned. No gallbladder fossa collection to suggest a bile leak or abscess. 2. Minimal amount of ascites noted adjacent to the liver and in the pelvis. This is nonspecific and along with lower abdominal and pelvic soft tissue  edema and small pleural effusions suggests a diffuse edematous state. 3. No acute findings within the abdomen or pelvis. 4. Mild generalized increased stool in the colon. No bowel inflammation or obstruction. Electronically Signed   By: Amie Portland M.D.   On: 09/06/2018 14:33       Assessment / Plan:   42 y/o male with history of substance abuse admitted with cardiogenic shock, on ventilator / pressors, developed shock liver during his course, has multifocal strokes in R MCA distribution. Also s/p cholecystostomy tube placement on 10/20 for possible cholecystitis.   Transaminases were quite elevated previously in multiple 1000s (11K peak), now have continued to downtrend, with rise in bilirubin recently after it was previously downtrending. Interestingly this bilirubinemia yesterday was predominantly indirect yesterday but appears to be direct on today's lab. Serologic workup pending to assess for viral, autoimmune, Wilson', etc. No anemia, workup for hemolysis done, heptoglobin pending.  It's possible this may be a delayed bilirubinemia from the shock liver given the direct component today, although a bit atypical as this was previously improving while his ALT has steadily decreased. US shows no thrombus. I agree with echocardiogram to ensure no endocarditis, that is pending today, as cholestasis from sepsis also possible. Otherwise, he has cholecystostomy tube in place for possible cholecystitis on 10/20. His follow up imaging with [place] on 10/25 and CT on 10/26 shows the tube in good place, could have IR re-evaluate the tube to ensure okay. The other thing to consider is DILI, including amiodarone toxicity as he's been on IV infusion for arrhythmias - while rare, acute amiodarone toxicity is possible in this setting. From  cardiac standpoint if his LAEs worsen, will need to consider withdrawing it if he can tolerate it. INR elevated but appears to be improving. Continue to trend hepatic functional panel  with direct bilirubin daily, continue supportive care. This patient is critically ill with high mortality. Girl friend and other family at bedside, I answered their questions.  Will follow, call with questions.  Ileene Patrick, MD [facility]

## 2018-09-08 NOTE — Progress Notes (Signed)
This note also relates to the following rows which could not be included: SpO2 - Cannot attach notes to unvalidated device data  RT NOTE: Patient placed back on full support as no patient effort.

## 2018-09-08 NOTE — Progress Notes (Addendum)
Patient ID: Cody Patterson, male   DOB: [birth year], 42 y.o.   MRN: [id number]     Advanced Heart Failure Rounding Note  PCP-Cardiologist: No primary care provider on file.   Subjective:    Remains intubated and sedated. Has been following commands with left side.   Milrinone off. Co-ox 76%  PICC placed yesterday. Now off NE.   Family discussing comfort care vs trach/peg.   MRI 10/25: MRI HEAD: 1. 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 2. Borderline parenchymal brain volume loss for age. 3. Minimal chronic small vessel ischemic changes.  MRA HEAD: 1. 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 Recommend CTA head and neck.  Objective:   Weight Range: 104.9 kg Body mass index is 31.36 kg/m.   Vital Signs:   Temp:  [98.1 F (36.7 C)-102 F (38.9 C)] 100 F (37.8 C) (10/28 0800) Pulse Rate:  [96-122] 100 (10/28 0800) Resp:  [0-20] 13 (10/28 0800) BP: (100-127)/(78-102) 113/89 (10/28 0800) SpO2:  [96 %-99 %] 98 % (10/28 0800) FiO2 (%):  [30 %] 30 % (10/28 0739) Weight:  [104.9 kg] 104.9 kg (10/28 0500) Last BM Date: 09/02/2018  Weight change: Filed Weights   09/06/18 0500 09/07/18 0500 09/08/18 0500  Weight: 105.7 kg 104.1 kg 104.9 kg    Intake/Output:   Intake/Output Summary (Last 24 hours) at 09/08/2018 0848 Last data filed at 09/08/2018 0800 Gross per 24 hour  Intake 4586.08 ml  Output 3105 ml  Net 1481.08 ml      Physical Exam   General:  Intubated/sedated. Jaundiced  HEENT: normal + scleral icterus. +ETT Neck: supple. JVP jaw Carotids 2+ bilat; no bruits. No lymphadenopathy or thryomegaly appreciated. Cor: PMI nondisplaced. Regular rate & rhythm. No rubs, gallops or murmurs. Lungs: coarse Abdomen: soft, nontender, nondistended. No hepatosplenomegaly. No bruits or masses. Good bowel sounds. Extremities: no cyanosis, clubbing, rash, 3+  edema Neuro: intubated sedated   Telemetry   Sinus tach 100-110Personally reviewed   EKG    No new tracings.  Labs    CBC Recent Labs    09/07/18 0320 09/08/18 0335  WBC 21.6* 22.9*  HGB 14.9 14.7  HCT 47.9 48.2  MCV 88.5 89.6  PLT 91* 941*   Basic Metabolic Panel Recent Labs    09/06/18 0435  09/07/18 0320 09/08/18 0335  NA 153*   < > 155* 158*  K 4.6   < > 4.7 4.5  CL 113*   < > 117* 120*  CO2 31  --  30 32  GLUCOSE 114*   < > 129* 134*  BUN 90*   < > 79* 73*  CREATININE 1.61*   < > 1.52* 1.31*  CALCIUM 8.4*  --  8.5* 8.4*  MG 2.6*   < > 2.5* 2.7*  PHOS 5.9*  --   --   --    < > = values in this interval not displayed.   Liver Function Tests Recent Labs    09/07/18 0852 09/08/18 0335  AST 144* 131*  ALT 264* 222*  ALKPHOS 73 69  BILITOT 24.5* 25.1*  PROT 5.6* 5.4*  ALBUMIN 1.9* 1.9*   No results for input(s): LIPASE, AMYLASE in the last 72 hours. Cardiac Enzymes No results for input(s): CKTOTAL, CKMB, CKMBINDEX, TROPONINI in the last 72 hours.  BNP: BNP (last 3 results) No results for input(s): BNP in the last 8760 hours.  ProBNP (last 3 results) No results  for input(s): PROBNP in the last 8760 hours.   D-Dimer No results for input(s): DDIMER in the last 72 hours. Hemoglobin A1C No results for input(s): HGBA1C in the last 72 hours. Fasting Lipid Panel No results for input(s): CHOL, HDL, LDLCALC, TRIG, CHOLHDL, LDLDIRECT in the last 72 hours. Thyroid Function Tests No results for input(s): TSH, T4TOTAL, T3FREE, THYROIDAB in the last 72 hours.  Invalid input(s): FREET3  Other results:   Imaging    Dg Abd 1 View  Result Date: 09/07/2018 CLINICAL DATA:  Enteric tube placement. Cooling blanket could not be removed for image. EXAM: ABDOMEN - 1 VIEW COMPARISON:  None. FINDINGS: Enteric tube courses through the stomach with tip in the midline likely over the distal stomach. Percutaneous pigtail drainage catheter over the right upper  quadrant. Bowel gas pattern is nonobstructive. Visualized lung bases are within normal. IMPRESSION: Nonobstructive bowel gas pattern. Nasogastric tube with tip over the distal stomach in the midline abdomen. Electronically Signed   By: Marin Olp M.D.   On: 09/07/2018 23:52     Medications:     Scheduled Medications: . chlorhexidine gluconate (MEDLINE KIT)  15 mL Mouth Rinse BID  . Chlorhexidine Gluconate Cloth  6 each Topical Daily  . feeding supplement (PRO-STAT SUGAR FREE 64)  60 mL Per Tube TID  . feeding supplement (VITAL HIGH PROTEIN)  1,000 mL Per Tube Q24H  . folic acid  1 mg Intravenous Daily  . free water  400 mL Per Tube Q8H  . heparin injection (subcutaneous)  5,000 Units Subcutaneous Q8H  . insulin aspart  0-9 Units Subcutaneous Q4H  . mouth rinse  15 mL Mouth Rinse 10 times per day  . multivitamin  15 mL Per Tube Daily  . senna-docusate  2 tablet Per Tube BID  . sodium chloride flush  10-40 mL Intracatheter Q12H  . sodium chloride flush  5 mL Intracatheter Q8H  . thiamine injection  100 mg Intravenous Daily    Infusions: . sodium chloride Stopped (08/30/2018 0614)  . sodium chloride 10 mL/hr at 09/08/18 0800  . amiodarone 30 mg/hr (09/08/18 0800)  . famotidine (PEPCID) IV 20 mg (09/07/18 9735)  . fentaNYL infusion INTRAVENOUS 400 mcg/hr (09/08/18 0800)  . meropenem (MERREM) IV 2 g (09/08/18 0509)  . milrinone 0.125 mcg/kg/min (09/08/18 0800)  . norepinephrine (LEVOPHED) Adult infusion 15 mcg/min (09/08/18 0800)  . vancomycin Stopped (09/08/18 0438)    PRN Medications: fentaNYL (SUBLIMAZE) injection, fentaNYL (SUBLIMAZE) injection, midazolam, sodium chloride flush    Patient Profile   Cody Patterson is a 42 y.o. 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   Presented to [facility] 10/16 with worsening dyspnea. Became hypotensive and with respiratory distress requiring pressor support and intubation. Transferred to  [facility] for treatment and further evaluation.   Assessment/Plan   1. Shock: Mixed septic/cardiogenic shock. - H.flu in sputum. Repeat blood cultures NGTD. - Suspect primarily septic shock. WBC remains elevated  - on vanc and meropenem. - CT ab no evidence of abscess - Off norepi and milrinone. Co-ox ok 2. Acute on chronic systolic CHF:  Echocardiogram from [facility] 10/16: LV moderately dilated, severely impaired EF at 10%, moderate RV dysfunction, moderately elevated RVSP.  Nonischemic cardiomyopathy pre-existed this hospitalization (had cath at [facility]).  Suspect cardiomyopathy is due to substance abuse (amphetamines).   - Off milrinone and NE. Co-ox 76% - Has massive 3rd spacing but holding diuretics with hypotension and severe hypernatremia - If family  wants to proceed with aggressive care will need to start trying gentle diuresis soon  - He is not candidate for VAD or transplant with active substance abuse.  3. Acute respiratory failure: Remains intubated and sedated. Suspect PNA. P/CCM managing. Sputum + H influenzae.  - will need trach soon if aggressive care desired 4. Acute CVA - Head CTshowed suspected recent acute to subacute infarct. Neuro consulted. - MRI 10/25 showed acute to early subacute multifocal small RIGHT MCA territory infarcts. MRA showed occluded right ICA with thready intracranial reconstitution; slow flow RIGHT MCA without emergent cerebral artery occlusion. Recommend CTA head and neck. - Appears to have dense left hemiparesis  -Per neuro and primary and goals of care conversation. 5. Polysubstance abuse: UDS + amphetamines and THC. No change.   6. ID: Septic shock.  Has H influenzae in sputum, suspect PNA.  He also appears to have acute cholecystitis on abdominal US.  - Continue meropenem/Vanc - S/p percutaneous cholecystostomy tube 10/20 (per CCM).  - abdominal CT without abscess or other acute issues 7. Acute liver failure: Elevated LFTs.  Suspect ischemic hepatitis  with shock + acute cholecystitis. Transaminases trending down but tbili rising again, likely in setting of acute cholecystitis. No change. - s/p perc drain.  - bili continues to climb. Level pending today - CT unremarkable  8. Hypernatremia -continue free water boluses. - sodium slightly improved today 9. Atrial fibrillation and NSVT, paroxysmal: Now back in NSR. Will continue amio gtt. Dout this is causing cholestatic picture - INR 1.6  He is not on AC. (received vitamin K 10/19 and 10/20 for choley drain)  - Continue amiodarone gtt 30 mg/hr.   He is making a bit of progress but if he survives with have severe residual deficits + ongoing severe cardiomyopathy + trach/PEG. Family discussing options of trach/PEG vs comfort care.   CRITICAL CARE Performed by: Glori Bickers  Total critical care time: 35 minutes  Critical care time was exclusive of separately billable procedures and treating other patients.  Critical care was necessary to treat or prevent imminent or life-threatening deterioration.  Critical care was time spent personally by me (independent of midlevel providers or residents) on the following activities: development of treatment plan with patient and/or surrogate as well as nursing, discussions with consultants, evaluation of patient's response to treatment, examination of patient, obtaining history from patient or surrogate, ordering and performing treatments and interventions, ordering and review of laboratory studies, ordering and review of radiographic studies, pulse oximetry and re-evaluation of patient's condition.    Length of Stay: [place], MD  09/08/2018, 8:48 AM  Advanced Heart Failure Team Pager [phone] (M-F; 7a - 4p)  Please contact [facility] for night-coverage after hours (4p -7a ) and weekends on [email or web address]

## 2018-09-08 NOTE — Progress Notes (Signed)
Pt with 8 beat run VT. ELink RN notified. BMP resulting. Advise when results are in.

## 2018-09-08 NOTE — Progress Notes (Signed)
Referring Physician(s): Dr. Isaiah Serge  Supervising Physician: Ruel Favors  Patient Status:  [facility] - In-pt  Chief Complaint: Cholecystitis  Subjective: Remains intubated.   Cholecystostomy remains in place.  Tbili now 25.1 Still with fevers.   Allergies: Amoxicillin and Penicillins  Medications: Prior to Admission medications   Not on File     Vital Signs: BP 108/84   Pulse 92   Temp 99 F (37.2 C)   Resp (!) 7   Ht 6' (1.829 m)   Wt 231 lb 4.2 oz (104.9 kg)   SpO2 98%   BMI 31.36 kg/m   Physical Exam  NAD, intubated, sedated.  Eyes: scleral icterus Abdomen: Soft, non-distended.  Cholecystostomy in place.  Insertion site c/d/i.  Bilious output in collection bag.  65 mL recorded overnight.   Imaging: Ct Abdomen Pelvis Wo Contrast  Result Date: 09/06/2018 CLINICAL DATA:  Pt is septic, s/p chole drain placed 10/20. Pt has continued to run a fever the last several days. Concern for possible infection. EXAM: CT CHEST, ABDOMEN AND PELVIS WITHOUT CONTRAST TECHNIQUE: Multidetector CT imaging of the chest, abdomen and pelvis was performed following the standard protocol without IV contrast. COMPARISON:  Abdomen ultrasound, 09/05/2018. CTs, 07/12/2014 and 07/05/2014. FINDINGS: CT CHEST FINDINGS Cardiovascular: Heart is mildly enlarged. No pericardial effusion. Left and right coronary artery calcifications. Great vessels are normal in caliber. No aortic atherosclerotic calcifications. Mediastinum/Nodes: No neck base or axillary masses or enlarged lymph nodes. No mediastinal or hilar masses or enlarged lymph nodes. Endotracheal tube tip projects 4 cm above the Carina. Right internal jugular central venous line tip lies at the caval atrial junction. Nasal/orogastric tube passes below the diaphragm into the mid stomach. Lungs/Pleura: Small bilateral pleural effusions. There is dependent opacity in both lower lobes with adjacent ground-glass type opacities. Subtle hazy ground-glass  opacity is noted in the posterior right upper lobe and in the posterolateral left upper lobe. There is no interstitial thickening or vascular congestion to suggest pulmonary edema. No lung mass or discrete nodule. No pneumothorax. Musculoskeletal: Developmentally small disks from T3 through T5. No fracture or acute finding. No osteoblastic or osteolytic lesions. CT ABDOMEN PELVIS FINDINGS Hepatobiliary: Liver normal in size with no mass or focal lesion. Gallbladder is been decompressed with a cholecystotomy tube. There is no fluid collection in the gallbladder fossa to suggest a bile leak or abscess. No bile duct dilation. Pancreas: Unremarkable. No pancreatic ductal dilatation or surrounding inflammatory changes. Spleen: Normal in size without focal abnormality. Adrenals/Urinary Tract: No adrenal masses. Kidneys normal in size, orientation and position. No renal masses, stones or hydronephrosis. Normal ureters. Bladder decompressed with a Foley catheter. Stomach/Bowel: Stomach is unremarkable. Small bowel is normal caliber. No wall thickening or inflammation. Mild distention of the right colon. No colonic wall thickening or inflammation. Mild generalized increased stool burden noted in the colon most evident in the right colon. Appendix not discretely visualized. No evidence of appendicitis. Vascular/Lymphatic: Aortic atherosclerosis. No enlarged abdominal or pelvic lymph nodes. Reproductive: Unremarkable. Other: Minimal ascites noted adjacent to the liver and in the pelvis. Diffuse subcutaneous edema noted along the lower abdomen and pelvis extending to the proximal lower extremities. No abdominal wall hernia. Musculoskeletal: No acute or significant osseous findings. IMPRESSION: CHEST CT 1. There are areas of ground-glass opacity in the lungs. This may be due to mild airspace edema or infection/inflammation. 2. There is dependent opacity in both lower lobes that is most consistent with atelectasis, associated  with small effusions. 3. Mild cardiomegaly.  ABDOMEN AND PELVIS CT 1. Status post cholecystotomy for decompression. Tube is well positioned. No gallbladder fossa collection to suggest a bile leak or abscess. 2. Minimal amount of ascites noted adjacent to the liver and in the pelvis. This is nonspecific and along with lower abdominal and pelvic soft tissue edema and small pleural effusions suggests a diffuse edematous state. 3. No acute findings within the abdomen or pelvis. 4. Mild generalized increased stool in the colon. No bowel inflammation or obstruction. Electronically Signed   By: Amie Portland M.D.   On: 09/06/2018 14:33   Dg Abd 1 View  Result Date: 09/07/2018 CLINICAL DATA:  Enteric tube placement. Cooling blanket could not be removed for image. EXAM: ABDOMEN - 1 VIEW COMPARISON:  None. FINDINGS: Enteric tube courses through the stomach with tip in the midline likely over the distal stomach. Percutaneous pigtail drainage catheter over the right upper quadrant. Bowel gas pattern is nonobstructive. Visualized lung bases are within normal. IMPRESSION: Nonobstructive bowel gas pattern. Nasogastric tube with tip over the distal stomach in the midline abdomen. Electronically Signed   By: Elberta Fortis M.D.   On: 09/07/2018 23:52   Ct Head Wo Contrast  Result Date: 09/04/2018 CLINICAL DATA:  42 y/o  M; decreased responsiveness, encephalopathy. EXAM: CT HEAD WITHOUT CONTRAST TECHNIQUE: Contiguous axial images were obtained from the base of the skull through the vertex without intravenous contrast. COMPARISON:  10/13/2015 CT head. FINDINGS: Brain: Small area of hypoattenuation with focal loss of gray-white differentiation in the right parietal lobe. No associated hemorrhage or mass effect. No extra-axial collection, hydrocephalus, or herniation. Vascular: Calcific atherosclerosis of carotid siphons. No hyperdense vessel identified. Skull: Normal. Negative for fracture or focal lesion. Sinuses/Orbits: No  acute finding. Other: None. IMPRESSION: Small area of hypoattenuation with focal loss of gray-white differentiation in the right parietal lobe, suspected recent acute to subacute infarction. No associated hemorrhage or mass effect. These results will be called to the ordering clinician or representative by the Radiologist Assistant, and communication documented in the PACS or zVision Dashboard. Electronically Signed   By: Mitzi Hansen M.D.   On: 09/04/2018 15:18   Ct Chest Wo Contrast  Result Date: 09/06/2018 CLINICAL DATA:  Pt is septic, s/p chole drain placed 10/20. Pt has continued to run a fever the last several days. Concern for possible infection. EXAM: CT CHEST, ABDOMEN AND PELVIS WITHOUT CONTRAST TECHNIQUE: Multidetector CT imaging of the chest, abdomen and pelvis was performed following the standard protocol without IV contrast. COMPARISON:  Abdomen ultrasound, 09/05/2018. CTs, 07/12/2014 and 07/05/2014. FINDINGS: CT CHEST FINDINGS Cardiovascular: Heart is mildly enlarged. No pericardial effusion. Left and right coronary artery calcifications. Great vessels are normal in caliber. No aortic atherosclerotic calcifications. Mediastinum/Nodes: No neck base or axillary masses or enlarged lymph nodes. No mediastinal or hilar masses or enlarged lymph nodes. Endotracheal tube tip projects 4 cm above the Carina. Right internal jugular central venous line tip lies at the caval atrial junction. Nasal/orogastric tube passes below the diaphragm into the mid stomach. Lungs/Pleura: Small bilateral pleural effusions. There is dependent opacity in both lower lobes with adjacent ground-glass type opacities. Subtle hazy ground-glass opacity is noted in the posterior right upper lobe and in the posterolateral left upper lobe. There is no interstitial thickening or vascular congestion to suggest pulmonary edema. No lung mass or discrete nodule. No pneumothorax. Musculoskeletal: Developmentally small disks from  T3 through T5. No fracture or acute finding. No osteoblastic or osteolytic lesions. CT ABDOMEN PELVIS FINDINGS Hepatobiliary: Liver normal  in size with no mass or focal lesion. Gallbladder is been decompressed with a cholecystotomy tube. There is no fluid collection in the gallbladder fossa to suggest a bile leak or abscess. No bile duct dilation. Pancreas: Unremarkable. No pancreatic ductal dilatation or surrounding inflammatory changes. Spleen: Normal in size without focal abnormality. Adrenals/Urinary Tract: No adrenal masses. Kidneys normal in size, orientation and position. No renal masses, stones or hydronephrosis. Normal ureters. Bladder decompressed with a Foley catheter. Stomach/Bowel: Stomach is unremarkable. Small bowel is normal caliber. No wall thickening or inflammation. Mild distention of the right colon. No colonic wall thickening or inflammation. Mild generalized increased stool burden noted in the colon most evident in the right colon. Appendix not discretely visualized. No evidence of appendicitis. Vascular/Lymphatic: Aortic atherosclerosis. No enlarged abdominal or pelvic lymph nodes. Reproductive: Unremarkable. Other: Minimal ascites noted adjacent to the liver and in the pelvis. Diffuse subcutaneous edema noted along the lower abdomen and pelvis extending to the proximal lower extremities. No abdominal wall hernia. Musculoskeletal: No acute or significant osseous findings. IMPRESSION: CHEST CT 1. There are areas of ground-glass opacity in the lungs. This may be due to mild airspace edema or infection/inflammation. 2. There is dependent opacity in both lower lobes that is most consistent with atelectasis, associated with small effusions. 3. Mild cardiomegaly. ABDOMEN AND PELVIS CT 1. Status post cholecystotomy for decompression. Tube is well positioned. No gallbladder fossa collection to suggest a bile leak or abscess. 2. Minimal amount of ascites noted adjacent to the liver and in the pelvis.  This is nonspecific and along with lower abdominal and pelvic soft tissue edema and small pleural effusions suggests a diffuse edematous state. 3. No acute findings within the abdomen or pelvis. 4. Mild generalized increased stool in the colon. No bowel inflammation or obstruction. Electronically Signed   By: Amie Portland M.D.   On: 09/06/2018 14:33   Mr Maxine Glenn Head Wo Contrast  Result Date: 09/05/2018 CLINICAL DATA:  Altered mental status, follow-up infarct. History of polysubstance abuse, cardiomyopathy. EXAM: MRI HEAD WITHOUT CONTRAST MRA HEAD WITHOUT CONTRAST TECHNIQUE: Multiplanar, multiecho pulse sequences of the brain and surrounding structures were obtained without intravenous contrast. Angiographic images of the head were obtained using MRA technique without contrast. COMPARISON:  CT HEAD September 04, 2018 FINDINGS: MRI HEAD FINDINGS INTRACRANIAL CONTENTS: Patchy reduced diffusion RIGHT parietal lobe, with subcentimeter foci reduced diffusion RIGHT frontal and temporal lobes, areas of diffusion abnormality demonstrate faintly decreased ADC values. No susceptibility artifact to suggest hemorrhage or septic emboli. A few scattered subcentimeter supratentorial white matter FLAIR T2 hyperintensities exclusive aforementioned abnormality. No midline shift, mass effect or masses. No abnormal extra-axial fluid collections. Borderline parenchymal brain volume loss for age. No hydrocephalus. VASCULAR: T2 bright signal RIGHT ICA. SKULL AND UPPER CERVICAL SPINE: No abnormal sellar expansion. No suspicious calvarial bone marrow signal. Craniocervical junction maintained. SINUSES/ORBITS: Bilateral mastoid effusions. Paranasal sinuses are well aerated. Secretions layering in the nasopharynx. Included ocular globes and orbital contents are non-suspicious. OTHER: Patient is edentulous.  Life support lines in place. MRA HEAD FINDINGS ANTERIOR CIRCULATION: Loss of RIGHT cervical internal carotid artery flow related  enhancement, thready within RIGHT petrous to supraclinoid segments. Patent LEFT internal carotid artery. Patent anterior communicating artery and anterior cerebral arteries. Patent though slow flow RIGHT middle cerebral artery. Normal LEFT MCA. No flow limiting stenosis, aneurysm. POSTERIOR CIRCULATION: Codominant vertebral arteries. Vertebrobasilar arteries are patent, with normal flow related enhancement of the main branch vessels. Patent posterior cerebral arteries. No large vessel occlusion, flow  limiting stenosis,  aneurysm. ANATOMIC VARIANTS: None. Source images and MIP images were reviewed. IMPRESSION: MRI HEAD: 1. 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 2. Borderline parenchymal brain volume loss for age. 3. Minimal chronic small vessel ischemic changes. MRA HEAD: 1. 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 Recommend CTA head and neck. These results will be called to the ordering clinician or representative by the Radiologist Assistant, and communication documented in the PACS or zVision Dashboard. Electronically Signed   By: Awilda Metro M.D.   On: 09/05/2018 06:30   Mr Brain Wo Contrast  Result Date: 09/05/2018 CLINICAL DATA:  Altered mental status, follow-up infarct. History of polysubstance abuse, cardiomyopathy. EXAM: MRI HEAD WITHOUT CONTRAST MRA HEAD WITHOUT CONTRAST TECHNIQUE: Multiplanar, multiecho pulse sequences of the brain and surrounding structures were obtained without intravenous contrast. Angiographic images of the head were obtained using MRA technique without contrast. COMPARISON:  CT HEAD September 04, 2018 FINDINGS: MRI HEAD FINDINGS INTRACRANIAL CONTENTS: Patchy reduced diffusion RIGHT parietal lobe, with subcentimeter foci reduced diffusion RIGHT frontal and temporal lobes, areas of diffusion abnormality demonstrate faintly decreased ADC values. No susceptibility  artifact to suggest hemorrhage or septic emboli. A few scattered subcentimeter supratentorial white matter FLAIR T2 hyperintensities exclusive aforementioned abnormality. No midline shift, mass effect or masses. No abnormal extra-axial fluid collections. Borderline parenchymal brain volume loss for age. No hydrocephalus. VASCULAR: T2 bright signal RIGHT ICA. SKULL AND UPPER CERVICAL SPINE: No abnormal sellar expansion. No suspicious calvarial bone marrow signal. Craniocervical junction maintained. SINUSES/ORBITS: Bilateral mastoid effusions. Paranasal sinuses are well aerated. Secretions layering in the nasopharynx. Included ocular globes and orbital contents are non-suspicious. OTHER: Patient is edentulous.  Life support lines in place. MRA HEAD FINDINGS ANTERIOR CIRCULATION: Loss of RIGHT cervical internal carotid artery flow related enhancement, thready within RIGHT petrous to supraclinoid segments. Patent LEFT internal carotid artery. Patent anterior communicating artery and anterior cerebral arteries. Patent though slow flow RIGHT middle cerebral artery. Normal LEFT MCA. No flow limiting stenosis, aneurysm. POSTERIOR CIRCULATION: Codominant vertebral arteries. Vertebrobasilar arteries are patent, with normal flow related enhancement of the main branch vessels. Patent posterior cerebral arteries. No large vessel occlusion, flow limiting stenosis,  aneurysm. ANATOMIC VARIANTS: None. Source images and MIP images were reviewed. IMPRESSION: MRI HEAD: 1. 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 2. Borderline parenchymal brain volume loss for age. 3. Minimal chronic small vessel ischemic changes. MRA HEAD: 1. 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 Recommend CTA head and neck. These results will be called to the ordering clinician or representative by the Radiologist Assistant, and communication  documented in the PACS or zVision Dashboard. Electronically Signed   By: Awilda Metro M.D.   On: 09/05/2018 06:30   Dg Chest Port 1 View  Result Date: 09/08/2018 CLINICAL DATA:  42 year old male with respiratory failure. Subsequent encounter. EXAM: PORTABLE CHEST 1 VIEW COMPARISON:  09/06/2018 CT.  09/05/2018 chest x-ray. FINDINGS: Right central line tip distal superior vena cava. Right internal jugular introducer catheter unchanged in position with tip projecting just above superior vena cava formation. Endotracheal tube tip 4.8 cm above the carina. Nasogastric tube courses below the diaphragm. Tip is not included on the present exam. No pneumothorax. Interval improved aeration lung bases with apparent clearing of basilar consolidation. Heart size within normal limits. IMPRESSION: Interval improved aeration of lung bases with apparent clearing of previously  noted basilar infiltrate/atelectasis. Subtle ground-glass opacity seen throughout remainder of lungs on recent chest CT not appreciated on the present plain film exam and may also have cleared in the interim. Electronically Signed   By: Lacy Duverney M.D.   On: 09/08/2018 08:49   Dg Chest Port 1 View  Result Date: 09/05/2018 CLINICAL DATA:  42 year old male with a history of endotracheal tube placement EXAM: PORTABLE CHEST 1 VIEW COMPARISON:  09/04/2018 FINDINGS: Cardiomediastinal silhouette unchanged. Improved aeration compared to the prior plain film with persistently low lung volumes. No pneumothorax. No large pleural effusion. Similar appearance of coarsened interstitial markings. Endotracheal tube terminates suitably above the carina approximately 5 cm. Unchanged right subclavian central catheter terminating in the region of the superior cavoatrial junction. Unchanged right IJ sheath. Unchanged gastric tube terminating out of the field of view. IMPRESSION: Improved aeration at the comparison plain film, with evidence of mild edema and/or  atelectasis. Endotracheal tube terminates suitably above the carina. Unchanged right subclavian central catheter, right IJ sheath, and gastric tube Electronically Signed   By: Gilmer Mor D.O.   On: 09/05/2018 11:00   [place] Ekg Site Rite  Result Date: 09/08/2018 If Site Rite image not attached, placement could not be confirmed due to current cardiac rhythm.  US Abdomen Limited Ruq  Result Date: 09/05/2018 CLINICAL DATA:  Acute cholecystitis. Status post cholecystostomy placement. EXAM: ULTRASOUND ABDOMEN LIMITED RIGHT UPPER QUADRANT COMPARISON:  Ultrasound of August 29, 2018. FINDINGS: Gallbladder: Cholelithiasis is noted with mild gallbladder wall thickening measuring 4 mm. Gallbladder is decompressed secondary to drainage catheter placement. No sonographic Murphy's sign is noted. Common bile duct: Diameter: 2.6 mm which is within normal limits. Liver: No focal lesion identified. Normal echogenicity of hepatic parenchyma is noted. Portal vein is patent on color Doppler imaging with normal direction of blood flow towards the liver. IMPRESSION: Cholelithiasis with mild gallbladder wall thickening is noted. Gallbladder is decompressed secondary to cholecystostomy placement. Electronically Signed   By: Lupita Raider, M.D.   On: 09/05/2018 15:08    Labs:  CBC: Recent Labs    09/05/18 0227 09/06/18 0435 09/06/18 1515 09/07/18 0320 09/08/18 0335  WBC 18.0* 23.1*  --  21.6* 22.9*  HGB 15.2 16.6 17.7* 14.9 14.7  HCT 45.8 53.0* 52.0 47.9 48.2  PLT 121* 99*  --  91* 111*    COAGS: Recent Labs    08/13/2018 1851  09/05/18 0227 09/06/18 0435 09/07/18 0320 09/08/18 0335  INR 2.58   < > 1.87 1.66 1.71 1.64  APTT 70*  --   --   --   --   --    < > = values in this interval not displayed.    BMP: Recent Labs    09/05/18 0227 09/06/18 0435 09/06/18 1515 09/07/18 0320 09/08/18 0335  NA 154* 153* 155* 155* 158*  K 4.3 4.6 4.6 4.7 4.5  CL 117* 113* 113* 117* 120*  CO2 32 31  --  30  32  GLUCOSE 126* 114* 99 129* 134*  BUN 87* 90* 77* 79* 73*  CALCIUM 7.8* 8.4*  --  8.5* 8.4*  CREATININE 1.58* 1.61* 1.70* 1.52* 1.31*  GFRNONAA 53* 52*  --  56* >60  GFRAA >60 >60  --  >60 >60    LIVER FUNCTION TESTS: Recent Labs    09/05/18 0227 09/05/18 1411 09/06/18 0435 09/07/18 0852 09/08/18 0335  BILITOT 14.3* 11.8* 21.3* 24.5* 25.1*  AST 100*  --  126* 144* 131*  ALT 393*  --  337* 264* 222*  ALKPHOS 83  --  91 73 69  PROT 5.2*  --  6.2* 5.6* 5.4*  ALBUMIN 1.9*  --  2.1* 1.9* 1.9*    Assessment and Plan: Cholecystitis s/p percutaneous cholecystostomy tube placement by Dr. Loreta Ave 10/20 Remains intubated, sedated. T bili increasing (now 25.1).  GI has seen.  Consideration for shock liver, DILI, or tube positioning.  Has had continued bilious output from the drain. CT Abdomen/Pelvis 10/26 showed decompressed gallbladder and well-positioned tube.  Blood and urine cultures with NGTD.  Unexplained fevers. Continue current management.  IR to follow.   Electronically Signed: Hoyt Koch, PA 09/08/2018, 1:13 PM   I spent a total of 15 Minutes at the the patient's bedside AND on the patient's hospital floor or unit, greater than 50% of which was counseling/coordinating care for cholecystitis.

## 2018-09-08 NOTE — Progress Notes (Signed)
eLink Physician-Brief Progress Note Patient Name: Cody Patterson DOB: [birth year] MRN: [id number]   Date of Service  09/08/2018  HPI/Events of Note  Na+ = 155 --> 155 --> 158 --> 156.  eICU Interventions  Will order: 1. D5W IV infusion increased to 60 mL/hour.  2. Repeat BMP at 11 PM.     Intervention Category Major Interventions: Electrolyte abnormality - evaluation and management  Taiya Nutting [place] 09/08/2018, 8:00 PM

## 2018-09-08 NOTE — Progress Notes (Signed)
  Echocardiogram 2D Echocardiogram has been performed.  Cody Patterson 09/08/2018, 10:57 AM

## 2018-09-08 NOTE — Progress Notes (Signed)
Peripherally Inserted Central Catheter/Midline Placement  The IV Nurse has discussed with the patient and/or persons authorized to consent for the patient, the purpose of this procedure and the potential benefits and risks involved with this procedure.  The benefits include less needle sticks, lab draws from the catheter, and the patient may be discharged home with the catheter. Risks include, but not limited to, infection, bleeding, blood clot (thrombus formation), and puncture of an artery; nerve damage and irregular heartbeat and possibility to perform a PICC exchange if needed/ordered by physician.  Alternatives to this procedure were also discussed.  Bard Power PICC patient education guide, fact sheet on infection prevention and patient information card has been provided to patient /or left at bedside.  Consent signed by wife due altered mental status.  PICC/Midline Placement Documentation  PICC Triple Lumen 09/08/18 PICC Right Basilic 46 cm 0 cm (Active)  Indication for Insertion or Continuance of Line Vasoactive infusions 09/08/2018  9:58 PM  Exposed Catheter (cm) 0 cm 09/08/2018  9:58 PM  Site Assessment Clean;Dry;Intact 09/08/2018  9:58 PM  Lumen #1 Status Flushed;Saline locked;Blood return noted 09/08/2018  9:58 PM  Lumen #2 Status Flushed;Saline locked;Blood return noted 09/08/2018  9:58 PM  Lumen #3 Status Flushed;Saline locked;Blood return noted 09/08/2018  9:58 PM  Dressing Type Transparent 09/08/2018  9:58 PM  Dressing Status Clean;Dry;Intact;Antimicrobial disc in place 09/08/2018  9:58 PM  Dressing Intervention New dressing 09/08/2018  9:58 PM  Dressing Change Due 09/15/18 09/08/2018  9:58 PM       Rexford Prevo, Lajean Manes 09/08/2018, 9:59 PM

## 2018-09-08 NOTE — Progress Notes (Signed)
STROKE TEAM PROGRESS NOTE   SUBJECTIVE (INTERVAL HISTORY) His girlfriend . Wife and another male family member are and RN are at the bedside. Pt still intubated on fentanyl.    He is improving, he is following commands such as squeeze fingers  And wiggle toes on the right.    OBJECTIVE Temp:  [98.1 F (36.7 C)-100.4 F (38 C)] 98.8 F (37.1 C) (10/28 1628) Pulse Rate:  [90-104] 96 (10/28 1628) Cardiac Rhythm: Sinus tachycardia (10/28 1200) Resp:  [0-20] 9 (10/28 1628) BP: (100-137)/(78-111) 116/88 (10/28 1628) SpO2:  [96 %-99 %] 98 % (10/28 1628) FiO2 (%):  [30 %] 30 % (10/28 1545) Weight:  [104.9 kg] 104.9 kg (10/28 0500)  Recent Labs  Lab 09/07/18 1948 09/08/18 0005 09/08/18 0343 09/08/18 1157 09/08/18 1626  GLUCAP 126* 97 109* 119* 131*   Recent Labs  Lab 09/02/18 0409 09/03/18 0322 09/04/18 0332 09/05/18 0227 09/06/18 0435 09/06/18 1514 09/06/18 1515 09/07/18 0320 09/08/18 0335  NA 142 146* 151* 154* 153*  --  155* 155* 158*  K 4.3 4.1 3.7 4.3 4.6  --  4.6 4.7 4.5  CL 106 108 114* 117* 113*  --  113* 117* 120*  CO2 28 32 30 32 31  --   --  30 32  GLUCOSE 149* 164* 124* 126* 114*  --  99 129* 134*  BUN 67* 72* 81* 87* 90*  --  77* 79* 73*  CREATININE 1.70* 1.67* 1.83* 1.58* 1.61*  --  1.70* 1.52* 1.31*  CALCIUM 8.1* 8.2* 7.8* 7.8* 8.4*  --   --  8.5* 8.4*  MG 2.0 2.2 2.5* 2.4 2.6* 2.7*  --  2.5* 2.7*  PHOS 4.1 5.1* 4.2  --  5.9*  --   --   --   --    Recent Labs  Lab 09/04/18 0332 09/05/18 0227 09/05/18 1411 09/06/18 0435 09/07/18 0852 09/08/18 0335  AST 105* 100*  --  126* 144* 131*  ALT 593* 393*  --  337* 264* 222*  ALKPHOS 88 83  --  91 73 69  BILITOT 11.9* 14.3* 11.8* 21.3* 24.5* 25.1*  PROT 5.2* 5.2*  --  6.2* 5.6* 5.4*  ALBUMIN 2.0* 1.9*  --  2.1* 1.9* 1.9*   Recent Labs  Lab 09/03/18 0322 09/04/18 0332 09/05/18 0227 09/06/18 0435 09/06/18 1515 09/07/18 0320 09/08/18 0335  WBC 17.9* 14.6* 18.0* 23.1*  --  21.6* 22.9*  NEUTROABS  15.0*  --   --   --   --   --   --   HGB 14.7 14.7 15.2 16.6 17.7* 14.9 14.7  HCT 45.0 44.8 45.8 53.0* 52.0 47.9 48.2  MCV 85.2 84.1 86.3 87.9  --  88.5 89.6  PLT 131* 92* 121* 99*  --  91* 111*   No results for input(s): CKTOTAL, CKMB, CKMBINDEX, TROPONINI in the last 168 hours. Recent Labs    09/06/18 0435 09/07/18 0320 09/08/18 0335  LABPROT 19.4* 19.8* 19.3*  INR 1.66 1.71 1.64   No results for input(s): COLORURINE, LABSPEC, PHURINE, GLUCOSEU, HGBUR, BILIRUBINUR, KETONESUR, PROTEINUR, UROBILINOGEN, NITRITE, LEUKOCYTESUR in the last 72 hours.  Invalid input(s): APPERANCEUR     Component Value Date/Time   CHOL 125 09/05/2018 0227   TRIG 208 (H) 09/05/2018 0227   HDL <10 (L) 09/05/2018 0227   CHOLHDL NOT CALCULATED 09/05/2018 0227   VLDL 42 (H) 09/05/2018 0227   LDLCALC NOT CALCULATED 09/05/2018 0227   Lab Results  Component Value Date   HGBA1C 6.2 (H)  09/05/2018   No results found for: LABOPIA, COCAINSCRNUR, LABBENZ, AMPHETMU, THCU, LABBARB  No results for input(s): ETH in the last 168 hours.  IMAGING   Dg Abd 1 View 08/23/2018 IMPRESSION:  NG tube tip in the mid stomach. Moderate gas and stool within the colon.   Ct Head Wo Contrast 09/04/2018 IMPRESSION:  Small area of hypoattenuation with focal loss of gray-white differentiation in the right parietal lobe, suspected recent acute to subacute infarction. No associated hemorrhage or mass effect.    Mr Maxine Glenn Head Wo Contrast 09/05/2018 IMPRESSION:   MRI HEAD:  1. 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  2. Borderline parenchymal brain volume loss for age.  3. Minimal chronic small vessel ischemic changes.   MRA HEAD:  1. 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 Recommend CTA head and neck.     Carotid Doppler   09/05/2018 Limited evaluation of right carotid artery system due to central line  placement.  There is evidence of a thumped right ICA waveform, suggestive of possible distal ICA occlusion.  Left internal carotid artery evaluation is suggestive of 1-39% stenosis.  Left vertebral artery is patent with antegrade flow. Preliminary results discussed with Dr. Roda Shutters.   TTE  - Mild to moderate LV dilation with EF 20%, diffuse hypokinesis.   Restrictive diastolic function. Mildly dilated RV with mildly   decreased systolic function. No significant valvular   abnormalities.   LE venous doppler Right: There is no evidence of deep vein thrombosis in the lower extremity. However, portions of this examination were limited- see technologist comments above. No cystic structure found in the popliteal fossa. Left: There is no evidence of deep vein thrombosis in the lower extremity. However, portions of this examination were limited- see technologist comments above. No cystic structure found in the popliteal fossa.   PHYSICAL EXAM  Temp:  [98.1 F (36.7 C)-100.4 F (38 C)] 98.8 F (37.1 C) (10/28 1628) Pulse Rate:  [90-104] 96 (10/28 1628) Resp:  [0-20] 9 (10/28 1628) BP: (100-137)/(78-111) 116/88 (10/28 1628) SpO2:  [96 %-99 %] 98 % (10/28 1628) FiO2 (%):  [30 %] 30 % (10/28 1545) Weight:  [104.9 kg] 104.9 kg (10/28 0500)  General - well nourished, well developed, intubated on sedation, opens eyes and grimaces to sternal rub, following commands to squeeze fingers and wiggle toes on left.  Ophthalmologic - fundi not visualized due to noncooperation.    Cardiovascular - regular rate and rhythm.   Neuro - intubated on sedation, opens eyes to stim, scleric icterus.  Pupil 2 mm bilaterally, sluggish to light, both eyes in upgaze position and dysconjugate, +corneals, blinks to threat on right, gag and cough weakly present today.  Squeezes fingers to command and wiggles toes to command on the right only.DTRs hyporeflexic. dense left hemiplegia  ASSESSMENT/PLAN Mr. Cody Patterson  is a 42 y.o. male with history of CHF, cardiomyopathy with EF 10-20%, polysubstance abuse, paroxysmal A. fib not on [facility] admitted for shortness of breath.  Found to have bacteremia, fever, sepsis, respiratory failure requiring intubation and antibiotics, abnormal LFT with elevated INR and bilirubin, acute cholecystitis s/p percutaneous cholecystectomy, THC and amphetamine abuse, A. fib RVR and V. Tach resolved with amiodarone IV drip.09/04/18 had episode of agitation, tachypnea, tachycardia, hypertension, and abnormal breathing pattern during ventilation weaning, stat CT showed right parietal subacute small to moderate sized infarct.    Stroke:  right MCA small cortical infarcts, embolic pattern,  source not certain, however, combination of many potential causes likely including  A.fib RVR not on AC (but INR high), cardiomyopathy with low EF, and septic emboli   Resultant coma  CT head right parietal small subacute infarct  MRI  Right MCA cortical small infarcts  MRA  Right ICA occlusion vs. High grade stenosis  Carotid Doppler - Limited evaluation of right carotid artery system due to central line placement. There is evidence of a thumped right ICA waveform, suggestive of possible distal ICA occlusion. Left internal carotid artery evaluation is suggestive of 1-39% stenosis. Left vertebral artery is patent with antegrade flow. Technologist discussed preliminary results with Dr Roda Shutters Friday.  2D Echo  EF 20%  LE venous doppler no DVT  UDS - appears to have been cancelled  LDL NTC due to low HDL  HgbA1c 6.2  SCDs for VTE prophylaxis  No antithrombotic prior to admission, now on No antithrombotic given high INR. Consider anticoagulation once INR < 1.5 (1.66 on Saturday) (INR Sunday - 1.71)  Ongoing aggressive stroke risk factor management  Therapy recommendations:  pending  Disposition:  pending  Right ICA occlusion vs high grade stenosis  MRA suggest proximal ICA occlusion with distal  recon but slow flow  CUS - see above  BP goal 120-140  Not candidate for intervention at this time  Once Cre normalized, may consider CTA head and neck (1.52 on Sunday)  Sepsis and septic shock  Blood culture NGTD  US abdomen suggestive of cholecystitis  S/p percutaneous drain - bile culture NTGD  Leukocytosis WBC 14.6->18.0  BP on the low side  On levophed  Cardiomyopathy   Cardiogenic shock  Cardiology on board  EF 20%  Not on AC due to high INR  Liver failure  Sclera jaundice  Total bilirubin 11.9->14.3  AST ALT elevated  INR 2.1->1.57->2.01->1.87-> 1.66-> 1.71  AKI  On IVF  Cre 1.83->1.58 -> 1.61-> 1.52  May consider CTA head and neck once Cre normalized  Hypotension . On the low side . On levophed  BP goal 120-140 given right ICA occlusion vs. High grade stenosis  Other Stroke Risk Factors  Obesity, Body mass index is 31.36 kg/m.   Substance abuse - amphetamine and THC positive on UDS  Other Active Problems  Hypernatremia - 153 -> 1.55  Pt is now a full code (changed from DNR by spouse)  Hospital day # 12 End, his wife, girlfriend and other family members about his prognosis which appears poor given his multiple medical conditions. From the stroke standpoint he is likely to make only a slow recovery and have significant left hemiplegia requiring 24-hour care at home. Patient will likely need tracheostomy and prolonged ventilatory support and PEG tube and prolonged nursing home stay which the family members feel he may not want. I recommend palliative care team consult and careful discussion about goals of care before making decisions about trach and PEG. Discussed with Dr. Vassie Loll. This patient is critically ill due to stroke, sepsis, septic shock, cardiogenic shock, cardiomyopathy, liver failure and at significant risk of neurological worsening, death form recurrent stroke, sepsis, heart failure, cardiac arrest, cirrhosis. This patient's  care requires constant monitoring of vital signs, hemodynamics, respiratory and cardiac monitoring, review of multiple databases, neurological assessment, discussion with family, other specialists and medical decision making of high complexity. I spent 40 minutes of neurocritical care time in the care of this patient.  Delia Heady, MD Medical Director [facility] Pager: [phone] 09/08/2018 5:27 PM  To contact Stroke Continuity provider, please refer to [email or web address] After hours, contact General Neurology

## 2018-09-09 ENCOUNTER — Inpatient Hospital Stay (HOSPITAL_COMMUNITY): Payer: Medicaid Other

## 2018-09-09 DIAGNOSIS — K810 Acute cholecystitis: Secondary | ICD-10-CM

## 2018-09-09 DIAGNOSIS — R945 Abnormal results of liver function studies: Secondary | ICD-10-CM

## 2018-09-09 DIAGNOSIS — R7989 Other specified abnormal findings of blood chemistry: Secondary | ICD-10-CM

## 2018-09-09 DIAGNOSIS — I639 Cerebral infarction, unspecified: Secondary | ICD-10-CM

## 2018-09-09 LAB — BASIC METABOLIC PANEL
Anion gap: 4 — ABNORMAL LOW (ref 5–15)
BUN: 60 mg/dL — AB (ref 6–20)
CALCIUM: 8.3 mg/dL — AB (ref 8.9–10.3)
CHLORIDE: 119 mmol/L — AB (ref 98–111)
CO2: 32 mmol/L (ref 22–32)
CREATININE: 1.1 mg/dL (ref 0.61–1.24)
GFR calc Af Amer: >60 mL/min (ref >60)
GFR calc non Af Amer: >60 mL/min (ref >60)
Glucose, Bld: 120 mg/dL — ABNORMAL HIGH (ref 70–99)
Potassium: 3.9 mmol/L (ref 3.5–5.1)
SODIUM: 155 mmol/L — AB (ref 135–145)

## 2018-09-09 LAB — COOXEMETRY PANEL
Carboxyhemoglobin: 1.9 % — ABNORMAL HIGH (ref 0.5–1.5)
Methemoglobin: 0.7 % (ref 0.0–1.5)
O2 Saturation: 75.6 %
Total hemoglobin: 14.7 g/dL (ref 12.0–16.0)

## 2018-09-09 LAB — GLUCOSE, CAPILLARY
GLUCOSE-CAPILLARY: 100 mg/dL — AB (ref 70–99)
GLUCOSE-CAPILLARY: 105 mg/dL — AB (ref 70–99)
Glucose-Capillary: 115 mg/dL — ABNORMAL HIGH (ref 70–99)
Glucose-Capillary: 92 mg/dL (ref 70–99)
Glucose-Capillary: 108 mg/dL — ABNORMAL HIGH (ref 70–99)
Glucose-Capillary: 128 mg/dL — ABNORMAL HIGH (ref 70–99)

## 2018-09-09 LAB — HEPATIC FUNCTION PANEL
ALBUMIN: 1.9 g/dL — AB (ref 3.5–5.0)
ALK PHOS: 75 U/L (ref 38–126)
ALT: 191 U/L — AB (ref 0–44)
AST: 130 U/L — ABNORMAL HIGH (ref 15–41)
Bilirubin, Direct: 16.8 mg/dL — ABNORMAL HIGH (ref 0.0–0.2)
Indirect Bilirubin: 8.7 mg/dL — ABNORMAL HIGH (ref 0.3–0.9)
TOTAL PROTEIN: 5.6 g/dL — AB (ref 6.5–8.1)
Total Bilirubin: 25.5 mg/dL (ref 0.3–1.2)

## 2018-09-09 LAB — MAGNESIUM: MAGNESIUM: 2.5 mg/dL — AB (ref 1.7–2.4)

## 2018-09-09 LAB — ANTINUCLEAR ANTIBODIES, IFA: ANA Ab, IFA: NEGATIVE

## 2018-09-09 LAB — PROTIME-INR
INR: 1.55
PROTHROMBIN TIME: 18.4 s — AB (ref 11.4–15.2)

## 2018-09-09 MED ORDER — AMIODARONE HCL 200 MG PO TABS
200.0000 mg | ORAL_TABLET | Freq: Two times a day (BID) | ORAL | Status: DC
  Administered 2018-09-09 (×2): 200 mg
  Filled 2018-09-09 (×3): qty 1

## 2018-09-09 MED ORDER — PANTOPRAZOLE SODIUM 40 MG PO PACK
40.0000 mg | PACK | Freq: Every day | ORAL | Status: DC
  Administered 2018-09-09 – 2018-09-13 (×5): 40 mg
  Filled 2018-09-09 (×5): qty 20

## 2018-09-09 MED ORDER — CLONAZEPAM 0.5 MG PO TBDP
0.5000 mg | ORAL_TABLET | Freq: Two times a day (BID) | ORAL | Status: DC
  Administered 2018-09-09 – 2018-09-10 (×3): 0.5 mg
  Filled 2018-09-09 (×3): qty 1

## 2018-09-09 MED ORDER — FUROSEMIDE 10 MG/ML IJ SOLN
40.0000 mg | Freq: Every day | INTRAMUSCULAR | Status: DC
  Administered 2018-09-09 – 2018-09-11 (×3): 40 mg via INTRAVENOUS
  Filled 2018-09-09 (×3): qty 4

## 2018-09-09 NOTE — Progress Notes (Signed)
Progress Note   Subjective  Remains intubated, sedated. Palliative care meeting today. Family had several questions about multiple aspects of his care.    Objective   Vital signs in last 24 hours: Temp:  [98.4 F (36.9 C)-100.4 F (38 C)] 99.7 F (37.6 C) (10/29 1700) Pulse Rate:  [88-112] 110 (10/29 1700) Resp:  [0-19] 16 (10/29 1700) BP: (109-139)/(79-114) 139/114 (10/29 1700) SpO2:  [96 %-99 %] 98 % (10/29 1700) FiO2 (%):  [30 %] 30 % (10/29 1600) Weight:  [106.4 kg] 106.4 kg (10/29 0500) Last BM Date: 09/09/18 General:    white male, sedated and intubated Heart: tachy Lungs: Respirations even and unlabored,  Abdomen:  Soft, nontender and nondistended.  Extremities:  (+) LE edema. Neurologic:  sedated Psych:  sedated  Intake/Output from previous day: 10/28 0701 - 10/29 0700 In: 3125.8 [I.V.:1794.2; NG/GT:1005; IV Piggyback:321.6] Out: 2975 [Urine:2900; Drains:75] Intake/Output this shift: Total I/O In: 1589 [I.V.:660.4; Other:5; NG/GT:845; IV Piggyback:78.6] Out: 1400 [Urine:1400]  Lab Results: Recent Labs    09/07/18 0320 09/08/18 0335  WBC 21.6* 22.9*  HGB 14.9 14.7  HCT 47.9 48.2  PLT 91* 111*   BMET Recent Labs    09/08/18 1720 09/08/18 2243 09/09/18 0501  NA 156* 154* 155*  K 4.1 4.2 3.9  CL 118* 121* 119*  CO2 32 29 32  GLUCOSE 160* 124* 120*  BUN 69* 66* 60*  CREATININE 1.13 1.09 1.10  CALCIUM 8.3* 8.4* 8.3*   LFT Recent Labs    09/09/18 0800  PROT 5.6*  ALBUMIN 1.9*  AST 130*  ALT 191*  ALKPHOS 75  BILITOT 25.5*  BILIDIR 16.8*  IBILI 8.7*   PT/INR Recent Labs    09/08/18 0335 09/09/18 0501  LABPROT 19.3* 18.4*  INR 1.64 1.55    Studies/Results: Dg Abd 1 View  Result Date: 09/07/2018 CLINICAL DATA:  Enteric tube placement. Cooling blanket could not be removed for image. EXAM: ABDOMEN - 1 VIEW COMPARISON:  None. FINDINGS: Enteric tube courses through the stomach with tip in the midline likely over the distal  stomach. Percutaneous pigtail drainage catheter over the right upper quadrant. Bowel gas pattern is nonobstructive. Visualized lung bases are within normal. IMPRESSION: Nonobstructive bowel gas pattern. Nasogastric tube with tip over the distal stomach in the midline abdomen. Electronically Signed   By: Elberta Fortis M.D.   On: 09/07/2018 23:52   Dg Chest Port 1 View  Result Date: 09/09/2018 CLINICAL DATA:  Acute respiratory failure. EXAM: PORTABLE CHEST 1 VIEW COMPARISON:  Radiograph of September 08, 2018. FINDINGS: Stable cardiomediastinal silhouette. Endotracheal and nasogastric tubes are in grossly good position. Right subclavian catheter and PICC line are unchanged in position. No acute pulmonary disease is noted. No pneumothorax or pleural effusion is noted. Bony thorax is unremarkable. IMPRESSION: Stable support apparatus.  No acute abnormality seen. Electronically Signed   By: Lupita Raider, M.D.   On: 09/09/2018 09:37   Dg Chest Port 1 View  Result Date: 09/08/2018 CLINICAL DATA:  42 year old male with respiratory failure. Subsequent encounter. EXAM: PORTABLE CHEST 1 VIEW COMPARISON:  09/06/2018 CT.  09/05/2018 chest x-ray. FINDINGS: Right central line tip distal superior vena cava. Right internal jugular introducer catheter unchanged in position with tip projecting just above superior vena cava formation. Endotracheal tube tip 4.8 cm above the carina. Nasogastric tube courses below the diaphragm. Tip is not included on the present exam. No pneumothorax. Interval improved aeration lung bases with apparent clearing of basilar consolidation. Heart  size within normal limits. IMPRESSION: Interval improved aeration of lung bases with apparent clearing of previously noted basilar infiltrate/atelectasis. Subtle ground-glass opacity seen throughout remainder of lungs on recent chest CT not appreciated on the present plain film exam and may also have cleared in the interim. Electronically Signed   By:  Lacy Duverney M.D.   On: 09/08/2018 08:49   [place] Ekg Site Rite  Result Date: 09/08/2018 If Site Rite image not attached, placement could not be confirmed due to current cardiac rhythm.      Assessment / Plan:   42 y/o male with history of substance abuse admitted with cardiogenic shock, on ventilator / pressors, septic shock due to H influenza PNA, developed shock liver during his course, has multifocal strokes in R MCA distribution. Also s/p cholecystostomy tube placement on 10/20 for possible cholecystitis.   He clearly had shock liver during his course with transaminases > 10K. These have continued to downtrend as well as INR. His bilirubin has risen in recent days with ongoing fever. Initially had large indirect component but now direct > indirect. Serologic workup negative for hep B/C, autoimmune, Wilson's, etc, will screen for mono and CMV as well. No anemia but haptoglobin low and LDH elevated, so possible component of hemolysis.  Overall, I suspect he may have cholestasis of sepsis or resolving shock liver (with delayed hyperbilirubinemia later than usual, a bit atypical). Hopefully with downtrending of ALT and INR his bilirubin will follow. His [place] and CT showed no biliary ductal dilation and he has a cholecystostomy tube in place - I think MRCP likely low yield in regards to further evaluating the bile duct, and given his state would likely not be able to tolerate it. US shows no thrombus. DILI also a consideration (amiodarone use), but acute amiodarone toxicity is rare. Amiodarone IV has been stopped and now oral.   This patient remains critically ill with high mortality. Friends and family at bedside, I answered their questions. Continue supportive care at this time  Will follow, call with questions.  Ileene Patrick, MD [facility]

## 2018-09-09 NOTE — Progress Notes (Signed)
PMT provider to f/u with family this afternoon at 12pm. Thank you.   NO CHARGE  Vennie Homans, FNP-C Palliative Medicine Team  Phone: [phone] Fax: [phone]

## 2018-09-09 NOTE — Progress Notes (Signed)
Daily Progress Note   Patient Name: Cody Patterson       Date: 09/09/2018 DOB: Nov 19, 1975  Age: 42 y.o. MRN#: [id number] Attending Physician: Brand Males, MD Primary Care Physician: Patient, No Pcp Per Admit Date: 08/22/2018   Reason for Consultation/Follow-up: Establishing goals of care  Subjective: Patient weaning on ventilator. Will follow right-sided commands and opens eyes. No s/s of distress or discomfort. Sedation turned off. Multiple friends at bedside.   GOC:  Met with patient's wife Lemmie Evens) and girlfriend Andreas Newport) in waiting room.   I introduced Palliative Medicine as specialized medical care for people living with serious illness. It focuses on providing relief from the symptoms and stress of a serious illness. The goal is to improve quality of life for both the patient and the family.  We discussed a brief life review of the patient. Lemmie Evens describes him as very active and hardworking up until he started having hip and back issues leading to chronic pain. He no longer could work and provide for his family therefore started "hustling" again per Lemmie Evens (started about three years ago). Recent hospital admission at [facility] with diagnosis of nonischemic cardiomyopathy s/p cardiac cath. Andreas Newport shares that he has been non-compliant with cardiac medications and she has been trying to encourage him to take his meds.   Discussed course of hospitalization including diagnoses, interventions, and prognosis. Lemmie Evens and Andreas Newport understands "the odds are against [place]" but also share that he is showing signs of improvement each day (unresponsive on Saturday and today, following commands and believe he is having "lucid" periods). They do understand he is critically ill and not a candidate for  invasive cardiac procedures.   I attempted to elicit values and goals of care important to the patient per the family. Lemmie Evens shares that he has spoken of not wanting to live "like a vegetable" and initially said DNR and leaning against trach/feeding tube. Lemmie Evens now shares that she feels she needs to do "everything possible" to give him a chance, especially now that he is more awake and communicating with family. She doesn't feel right being "the reason for his death" and making the "life or death" decision.   Marla and Andreas Newport share that this morning when they asked him about trach, he took a deep sigh and raised his right arm--for  which they feel he was trying to tell them he wants to fight. They wish to further attempt discussion regarding trach with him this afternoon when friends have left.   Advanced directives, concepts specific to code status, and artifical feeding and hydration were discussed at length. I did educate on medical recommendation against resuscitation with the condition of his heart, and the poor chance of survival in the event or cardiac arrest. Described a code blue situation.   At this point, Lemmie Evens and Andreas Newport are leaning towards trach/peg if necessary. They are not ready to give up on him and remain hopeful that he will progress with therapy. We did discuss the unlikelihood of him returning to pre-hospital baseline, for which Lemmie Evens and Andreas Newport seem to understand. We also discussed if he requires trach and continued mechanical ventilation, SNF options are limited and often out of state. Lemmie Evens and Andreas Newport confirm their understanding of this.   Lemmie Evens asks for me to discuss with Dr. Elsworth Soho and ask for one more day on the ventilator. Again, they are hopeful to get better guidance from the patient whether he wants trach or not. We plan to meet again in AM.   Questions and concerns were addressed. PMT contact information. Lemmie Evens tells me they have many people praying for him.  Emotional/spiritual support provided.    Length of Stay: 13  Current Medications: Scheduled Meds:  . chlorhexidine gluconate (MEDLINE KIT)  15 mL Mouth Rinse BID  . Chlorhexidine Gluconate Cloth  6 each Topical Daily  . clonazepam  0.5 mg Per Tube BID  . feeding supplement (PRO-STAT SUGAR FREE 64)  60 mL Per Tube TID  . feeding supplement (VITAL HIGH PROTEIN)  1,000 mL Per Tube Q24H  . folic acid  1 mg Intravenous Daily  . free water  300 mL Per Tube Q4H  . heparin injection (subcutaneous)  5,000 Units Subcutaneous Q8H  . insulin aspart  0-9 Units Subcutaneous Q4H  . mouth rinse  15 mL Mouth Rinse 10 times per day  . multivitamin  15 mL Per Tube Daily  . pantoprazole sodium  40 mg Per Tube Q1200  . senna-docusate  2 tablet Per Tube BID  . sodium chloride flush  10-40 mL Intracatheter Q12H  . sodium chloride flush  10-40 mL Intracatheter Q12H  . sodium chloride flush  5 mL Intracatheter Q8H  . thiamine injection  100 mg Intravenous Daily    Continuous Infusions: . sodium chloride Stopped (08/23/2018 0614)  . sodium chloride 10 mL/hr at 09/08/18 1100  . amiodarone 30 mg/hr (09/09/18 0900)  . dextrose 60 mL/hr at 09/09/18 0900  . fentaNYL infusion INTRAVENOUS 50 mcg/hr (09/09/18 0900)  . meropenem (MERREM) IV 2 g (09/09/18 0515)    PRN Meds: fentaNYL (SUBLIMAZE) injection, fentaNYL (SUBLIMAZE) injection, midazolam, sodium chloride flush, sodium chloride flush  Physical Exam  Constitutional: He is easily aroused. He appears ill. He is intubated.  HENT:  Head: Normocephalic and atraumatic.  Cardiovascular: Regular rhythm.  Pulmonary/Chest: No accessory muscle usage. No tachypnea. He is intubated. No respiratory distress. He has decreased breath sounds.  Abdominal: There is no tenderness.  Perc drain  Neurological: He is easily aroused.  Opens eyes to voice, follows right-sided commands  Skin: Skin is warm and dry.  jaundice  Nursing note and vitals reviewed.            Vital Signs: BP (!) 128/96   Pulse 95   Temp 98.8 F (37.1 C)   Resp  12   Ht 6' (1.829 m)   Wt 106.4 kg   SpO2 97%   BMI 31.81 kg/m  SpO2: SpO2: 97 % O2 Device: O2 Device: Ventilator O2 Flow Rate:    Intake/output summary:   Intake/Output Summary (Last 24 hours) at 09/09/2018 1020 Last data filed at 09/09/2018 0900 Gross per 24 hour  Intake 3216.09 ml  Output 2620 ml  Net 596.09 ml   LBM: Last BM Date: 09/09/18 Baseline Weight: Weight: 92 kg Most recent weight: Weight: 106.4 kg       Palliative Assessment/Data: PPS 30%   Flowsheet Rows     Most Recent Value  Intake Tab  Referral Department  Critical care  Unit at Time of Referral  ICU  Palliative Care Primary Diagnosis  Cardiac  Date Notified  09/05/18  Palliative Care Type  New Palliative care  Reason for referral  Clarify Goals of Care  Date of Admission  09/02/2018  Date first seen by Palliative Care  09/06/18  # of days Palliative referral response time  1 Day(s)  # of days IP prior to Palliative referral  9  Clinical Assessment  Palliative Performance Scale Score  30%  Psychosocial & Spiritual Assessment  Palliative Care Outcomes  Patient/Family meeting held?  Yes  Who was at the meeting?  wife  Palliative Care Outcomes  Clarified goals of care, Provided psychosocial or spiritual support, Changed CPR status, ACP counseling assistance      Patient Active Problem List   Diagnosis Date Noted  . Goals of care, counseling/discussion   . Palliative care by specialist   . Cerebral embolism with cerebral infarction 09/04/2018  . Acute respiratory failure ([place])   . Cardiogenic shock ([place]) 08/30/2018    Palliative Care Assessment & Plan   Patient Profile: 42 y.o. 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 Patient found to have acute on chronic heart failure.  Echo on admission revealed EF of 10%. He is not candidate for VAD or transplant d/t substance abuse. Patient being treated for shock - mixed septic and cardiogenic. MRI on 10/25 revealed small right MCA infarct. Sputum positive for H. Influenzae. Appears to have pna and acute cholecystitis. Perc chole drain placed 10/20. Now spiking fevers. PMT consulted by CCM for [place].  Assessment: Septic/cardiogenic shocek Acute on chronic systolic CHF with EF 14% Acute respiratory failure Acute right MCA CVA Polysubstance abuse Metabolic encephalopathy H. Influenzae pneumonia Pulmonary edema Acute cholecystitis Acute liver failure Acute kidney injury  Recommendations/Plan:  Continue FULL code/FULL scope treatment.   Palliative f/u with patient's wife Barrister's clerk) and patient's girlfriend. They do have a good understanding of diagnoses, interventions, and poor prognosis ("the odds are against [place]") but leaning towards continued aggressive interventions (trach/peg) if necessary. They are not ready to give up on him, especially now that he is showing small signs of improvement (following commands, weaning on vent). They are hopeful to attempt further discussion with patient this afternoon/evening when friends leave. This morning, they feel he showed gestures of wanting to continue to fight. Family leaning towards trach/peg.  PMT will continue to follow and support patient/family.   Goals of Care and Additional Recommendations:  Limitations on Scope of Treatment: Full Scope Treatment  Code Status: FULL   Code Status Orders  (From admission, onward)         Start     Ordered   09/07/18  1456  Full code  Continuous     09/07/18 1455        Code Status History    Date Active Date Inactive Code Status Order ID Comments User Context   09/06/2018 1507 09/07/2018 1455 DNR [id number]  Philis Pique, NP Inpatient   09/03/2018 1918 09/06/2018 1507 Full Code [id number]  Kipp Brood, MD  Inpatient       Prognosis:   Guarded  Discharge Planning:  To Be Determined  Care plan was discussed with patient, RN, Dr. Elsworth Soho, patient's wife and gf  Thank you for allowing the Palliative Medicine Team to assist in the care of this patient.   Time In: 1200 Time Out: 1310 Total Time 66mn Prolonged Time Billed yes      Greater than 50%  of this time was spent counseling and coordinating care related to the above assessment and plan.  MIhor Dow FNP-C Palliative Medicine Team  Phone: [phone]: [phone] Please contact Palliative Medicine Team phone at [phone] questions and concerns.

## 2018-09-09 NOTE — Progress Notes (Signed)
Safeset placed on PICC line.

## 2018-09-09 NOTE — Progress Notes (Signed)
Wasted: 55cc fentanyl  Location: "Waste to be incinerated" container in Med Room A  Witness: Kenney Houseman RN

## 2018-09-09 NOTE — Progress Notes (Signed)
NAME:  Cody Patterson, MRN:  [id number], DOB:  20-Jan-1976, LOS: 13 ADMISSION DATE:  09/04/2018, CONSULTATION DATE:  08/26/2018 REFERRING MD:  Derek Mound, CHIEF COMPLAINT:  Cardiogenic shock   Brief History   42 year old man with history of 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 Presented to [facility] with increasing dyspnea. Acute decompensation with hypotension and severe respiratory distress requiring intubation. Transferred to [facility] for management of sepsis, advanced heart failure.  Growing H influenza in sputum.  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  Past Medical History  Known cardiomyopathy and drug abuse (THC and methamphetamines)  Significant Hospital Events   10/16- Intubated at [facility] 10/17- Worsening shock, maxed on pressors, right heart cath indicative of distributive shock, high output failure 10/18-Started on milrinone, lasix for reduced cardiac output. PA numbers show worsening CO and high wedge 10/19- Started making urine, pressors weaning down, continues on milrinone. Surgery consulted for cholecystitis 10/20- Mucus plug overnight requiring bag, lavage. Went into afib.  Perc chole drain placed by IR 10/23 Start SBT trials, failed due to tachypnea 10/24 [facility] type respirations > CT head which showed stroke 10/26 -  spiking fevers 103 - swinging fevers since 09/03/18. Rising bilirubin. Rpt [place] c/w Acute choley - per IR yesterday - GB drain working well. CT chest/abd - GB drain ok. Stoool + . No abscess CCS suspecting this is cholestasis; not a candidate for surgery. Total Bili > IndirectBili. Remains on amio gtt, milrinone, Of levophed . MADE DNR by Palliative care 09/07/18 - fever + by peak of curve < 103F WBC down trend.   Consults:  date of consult/date signed off & final recs:  10/17- Cardiology  10/19- Surgery signed on 09/02/2017 10/24 Neurology 10/26 - pallaitive  Procedures (surgical and bedside):  10/16 [facility] R subclavian CVC >> 10/28 10/16 [facility] L femoral arterial line >> out  10/17 PA catheter >> 10/28 10/20 Perc chole drain RUE PICC 10/28 >>  Significant Diagnostic Tests:  Echocardiogram 10/17- Mild to moderate LV dilation with EF 20%, diffuse hypokinesis.  Restrictive diastolic function. Mildly dilated RV with mildly decreased systolic function. No significant valvular abnormalities Abdominal ultrasound 08/29/2018- severe gallbladder thickening, sludge, cholelithiasis, small volume of perihepatic, pericholecystic and perinephric free fluid. UDS [facility] 10/16- Amphetamines, THC [place] 10/18 Initial numbers RA 10 PA 46/33 (41) PCWP 10 Thermo CO/CI  15.2/7.0 Fick 14.1/6.5 (co-ox 86%) SVR 231 CT head 10/24 > small area of hypoattenuation in right parietal lobe suspicious for recent acute to subacute infarct. 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  Occluded R ICA, slow flow R MCA.  CT chest / abd 10/24 >> mild GGO, bibasal consolidation, perc drain well positioned , no other abd cause of fever  Micro Data:  Bcx 10/17 >> ng Trach aspirate 10/17- H influenza MRSA PR 10/16 - positive Blood 10/22 - neg as of 10/27 Trach aspirate 10/25 - moderate yeast   Antimicrobials:  Vancomycin 10/17 > 10/21. 10/24 > 10/29 Meropenen 10/17 > Aztreonam 10/17>> off  SUBJECTIVE/OVERNIGHT/INTERVAL HX   afebrile Critically ill, intubated on amio gtt On lower fent gtt, calm    Objective   Blood pressure (!) 128/96, pulse 95, temperature 98.8 F (37.1 C), resp. rate 12, height 6' (1.829 m), weight 106.4 kg, SpO2  97 %.    Vent Mode: PSV;CPAP FiO2 (%):  [30 %] 30 % Set Rate:  [14 bmp] 14 bmp Vt Set:  [540 mL] 540 mL PEEP:   [5 cmH20] 5 cmH20 Pressure Support:  [8 cmH20-15 cmH20] 8 cmH20 Plateau Pressure:  [12 cmH20-17 cmH20] 13 cmH20   Intake/Output Summary (Last 24 hours) at 09/09/2018 1017 Last data filed at 09/09/2018 0900 Gross per 24 hour  Intake 3216.09 ml  Output 2620 ml  Net 596.09 ml   Filed Weights   09/07/18 0500 09/08/18 0500 09/09/18 0500  Weight: 104.1 kg 104.9 kg 106.4 kg      acutely ill, orally intubated, awake on fent gtt RA SS 0, follows commands, left hemiplegia No pallor, icterus present Decreased breath sounds bilateral, minimal secretions S1-S2 nml Soft and nontender abdomen, percutaneous drain with yellow bile, clear 2+ edema  Chest x-ray 10/29 personally reviewed which shows clearing of infiltrates     Assessment & Plan:   #Shock - Cardiogenic (AoC sCHF with cardiomyopathy and EF 10%), septic combination.  Severe cardiomyopathy, noncompliant. V tach, A. Fib (resolved with amiodarone).   PLAN - per cards;off milrionone Gtt , change to po amio -Not a candidate for LVAD due to substance abuse hx.  -    #Acute respiratory failure secondary to pulmonary edema and H. influenzae pneumonia. -Weaning limited by agitation and tachypnea   -Tolerates PS 5/5 , await decision about reintubation before extubating -Discussed tracheostomy with family   #Severe sepsis-H. influenzae pneumonia, +/ Acute cholecystitis. Procalcitonin trended down to 2.4 on 10/24 then rising again  PLAN Continue merrem and dc  vanc  Discontinued all lines    #Acute metabolic encephalopathy-related to sedation + ICU delirium + new stroke.  PLAN fent gtt , minimise RASS goal 0  Decrease clonazepam 0.5 mg twice daily, Precedex did not work well earlier  [employer] MCA stroke. - Neurology following. -  Needs aggressive rehab  #AKI - improving  -  Continue to follow urine output and creatinine. - Diuresis as able per cards  #Hypernatremia. - free water increased to 300 every  4 -Increase D5W to 100/h  #Elevated LFTs- s/p perc drain 10/20 doubt Acute cholecystitis  Etiology still unclear, GI input appreciated, seems to be direct bilirubinemia Favor shock liver although trend a little atypical, drain seems to be working well   PLAN - Continue drain x 4 - 6 weeks then can determine if surgery will be necessary. -Follow LFT     Disposition / Summary of Today's Plan 09/09/18   He is close to trial of extubation Need to get clear decision on reintubation/tracheostomy before proceeding. Feel that if he requires trach , he will liberate quickly but rehab will be difficult from stroke standpoint Tried to take his input - he seemed to understand but was unable to provide clear direction    Best Practice    Code Status: DNR revoked by wife Family Communication:  Wife girlfriend and brother at bedside.  Decision point here is whether we will proceed with tracheostomy or not.  Wife states that he would not want prolonged life support but is conflicted since he is mentally intact.     ATTESTATION & SIGNATURE   The patient is critically ill with multiple organ systems failure and requires high complexity decision making for assessment and support, frequent evaluation and titration of therapies, application of advanced monitoring technologies and extensive interpretation of multiple databases. Critical Care Time devoted to patient care services described in this  note independent of APP/resident  time is 35 minutes.   Cyril Mourning MD. Tonny Bollman. [place] [facility] Pager [phone] If no response call [phone]   09/09/2018

## 2018-09-10 ENCOUNTER — Inpatient Hospital Stay (HOSPITAL_COMMUNITY): Payer: Medicaid Other

## 2018-09-10 DIAGNOSIS — J9620 Acute and chronic respiratory failure, unspecified whether with hypoxia or hypercapnia: Secondary | ICD-10-CM

## 2018-09-10 DIAGNOSIS — I509 Heart failure, unspecified: Secondary | ICD-10-CM

## 2018-09-10 DIAGNOSIS — R945 Abnormal results of liver function studies: Secondary | ICD-10-CM

## 2018-09-10 LAB — HEPATIC FUNCTION PANEL
ALK PHOS: 85 U/L (ref 38–126)
ALT: 180 U/L — AB (ref 0–44)
AST: 141 U/L — ABNORMAL HIGH (ref 15–41)
Albumin: 1.9 g/dL — ABNORMAL LOW (ref 3.5–5.0)
BILIRUBIN DIRECT: 18 mg/dL — AB (ref 0.0–0.2)
BILIRUBIN INDIRECT: 8.6 mg/dL — AB (ref 0.3–0.9)
BILIRUBIN TOTAL: 26.6 mg/dL — AB (ref 0.3–1.2)
Total Protein: 6 g/dL — ABNORMAL LOW (ref 6.5–8.1)

## 2018-09-10 LAB — BASIC METABOLIC PANEL
Anion gap: 10 (ref 5–15)
BUN: 61 mg/dL — AB (ref 6–20)
CHLORIDE: 113 mmol/L — AB (ref 98–111)
CO2: 27 mmol/L (ref 22–32)
Calcium: 8.3 mg/dL — ABNORMAL LOW (ref 8.9–10.3)
Creatinine, Ser: 1.04 mg/dL (ref 0.61–1.24)
GFR calc Af Amer: >60 mL/min (ref >60)
GLUCOSE: 165 mg/dL — AB (ref 70–99)
POTASSIUM: 3.6 mmol/L (ref 3.5–5.1)
Sodium: 150 mmol/L — ABNORMAL HIGH (ref 135–145)

## 2018-09-10 LAB — GLUCOSE, CAPILLARY
GLUCOSE-CAPILLARY: 167 mg/dL — AB (ref 70–99)
GLUCOSE-CAPILLARY: 88 mg/dL (ref 70–99)
GLUCOSE-CAPILLARY: 105 mg/dL — AB (ref 70–99)
GLUCOSE-CAPILLARY: 78 mg/dL (ref 70–99)
Glucose-Capillary: 108 mg/dL — ABNORMAL HIGH (ref 70–99)
Glucose-Capillary: 81 mg/dL (ref 70–99)

## 2018-09-10 LAB — CULTURE, BLOOD (ROUTINE X 2)
CULTURE: NO GROWTH
SPECIAL REQUESTS: ADEQUATE

## 2018-09-10 LAB — COOXEMETRY PANEL
CARBOXYHEMOGLOBIN: 1.4 % (ref 0.5–1.5)
METHEMOGLOBIN: 1.1 % (ref 0.0–1.5)
O2 SAT: 55.8 %
Total hemoglobin: 15.8 g/dL (ref 12.0–16.0)

## 2018-09-10 LAB — PROTIME-INR
INR: 1.62
Prothrombin Time: 19 seconds — ABNORMAL HIGH (ref 11.4–15.2)

## 2018-09-10 LAB — CBC
HEMATOCRIT: 51.2 % (ref 39.0–52.0)
HEMOGLOBIN: 16.3 g/dL (ref 13.0–17.0)
MCH: 27.5 pg (ref 26.0–34.0)
MCHC: 31.8 g/dL (ref 30.0–36.0)
MCV: 86.5 fL (ref 80.0–100.0)
Platelets: 161 10*3/uL (ref 150–400)
RBC: 5.92 MIL/uL — ABNORMAL HIGH (ref 4.22–5.81)
RDW: 25.2 % — ABNORMAL HIGH (ref 11.5–15.5)
WBC: 28 10*3/uL — ABNORMAL HIGH (ref 4.0–10.5)
nRBC: 0.1 % (ref 0.0–0.2)

## 2018-09-10 LAB — MAGNESIUM: MAGNESIUM: 2.6 mg/dL — AB (ref 1.7–2.4)

## 2018-09-10 MED ORDER — METOPROLOL TARTRATE 5 MG/5ML IV SOLN
2.5000 mg | INTRAVENOUS | Status: DC | PRN
  Administered 2018-09-10 – 2018-09-11 (×2): 5 mg via INTRAVENOUS
  Administered 2018-09-11: 2.5 mg via INTRAVENOUS
  Filled 2018-09-10 (×3): qty 5

## 2018-09-10 MED ORDER — FENTANYL CITRATE (PF) 100 MCG/2ML IJ SOLN
50.0000 ug | INTRAMUSCULAR | Status: DC | PRN
  Administered 2018-09-10 – 2018-09-12 (×15): 100 ug via INTRAVENOUS
  Filled 2018-09-10 (×12): qty 2

## 2018-09-10 MED ORDER — SODIUM CHLORIDE 0.9 % IV SOLN
1.0000 g | Freq: Once | INTRAVENOUS | Status: AC
  Administered 2018-09-10: 1 g via INTRAVENOUS
  Filled 2018-09-10: qty 1

## 2018-09-10 MED ORDER — CHLORHEXIDINE GLUCONATE 0.12 % MT SOLN
OROMUCOSAL | Status: AC
  Administered 2018-09-10: 15 mL via OROMUCOSAL
  Filled 2018-09-10: qty 15

## 2018-09-10 NOTE — Progress Notes (Addendum)
Patient ID: Cody Patterson, male   DOB: 1976-04-11, 42 y.o.   MRN: [id number]     Advanced Heart Failure Rounding Note  PCP-Cardiologist: No primary care provider on file.   Subjective:    Sedated. Remains intubated. FiO2 30%. WBC trending up. Bilirubin trending up.   MRI 10/25: MRI HEAD: 1. 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 2. Borderline parenchymal brain volume loss for age. 3. Minimal chronic small vessel ischemic changes.  MRA HEAD: 1. 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 Recommend CTA head and neck.  Objective:   Weight Range: 106.4 kg Body mass index is 31.81 kg/m.   Vital Signs:   Temp:  [98.8 F (37.1 C)-100.8 F (38.2 C)] 100.2 F (37.9 C) (10/30 0700) Pulse Rate:  [95-119] 108 (10/30 0600) Resp:  [0-29] 0 (10/30 0700) BP: (101-155)/(83-124) 101/83 (10/30 0700) SpO2:  [96 %-99 %] 99 % (10/30 0600) FiO2 (%):  [30 %] 30 % (10/30 0315) Last BM Date: 09/09/18  Weight change: Filed Weights   09/07/18 0500 09/08/18 0500 09/09/18 0500  Weight: 104.1 kg 104.9 kg 106.4 kg    Intake/Output:   Intake/Output Summary (Last 24 hours) at 09/10/2018 0726 Last data filed at 09/10/2018 0700 Gross per 24 hour  Intake 2740.26 ml  Output 3121 ml  Net -380.74 ml      Physical Exam  General: Intubated/sedated. Jaundiced HEENT: ETT scleral icterus Neck: supple. JVP to jaw . Carotids 2+ bilat; no bruits. No lymphadenopathy or thryomegaly appreciated. Cor: PMI nondisplaced. Regular rate & rhythm. No rubs, gallops or murmurs. Lungs: clear Abdomen: soft, nontender, nondistended. No hepatosplenomegaly. No bruits or masses. Good bowel sounds. Extremities: cold, no cyanosis, clubbing, rash, edema Neuro: sedated     Telemetry   Sinus 100s personally reviewed    EKG    No new tracings.  Labs    CBC Recent Labs    09/08/18 0335  09/10/18 0312  WBC 22.9* 28.0*  HGB 14.7 16.3  HCT 48.2 51.2  MCV 89.6 86.5  PLT 111* 254   Basic Metabolic Panel Recent Labs    09/09/18 0501 09/10/18 0312  NA 155* 150*  K 3.9 3.6  CL 119* 113*  CO2 32 27  GLUCOSE 120* 165*  BUN 60* 61*  CREATININE 1.10 1.04  CALCIUM 8.3* 8.3*  MG 2.5* 2.6*   Liver Function Tests Recent Labs    09/09/18 0800 09/10/18 0312  AST 130* 141*  ALT 191* 180*  ALKPHOS 75 85  BILITOT 25.5* 26.6*  PROT 5.6* 6.0*  ALBUMIN 1.9* 1.9*   No results for input(s): LIPASE, AMYLASE in the last 72 hours. Cardiac Enzymes No results for input(s): CKTOTAL, CKMB, CKMBINDEX, TROPONINI in the last 72 hours.  BNP: BNP (last 3 results) No results for input(s): BNP in the last 8760 hours.  ProBNP (last 3 results) No results for input(s): PROBNP in the last 8760 hours.   D-Dimer No results for input(s): DDIMER in the last 72 hours. Hemoglobin A1C No results for input(s): HGBA1C in the last 72 hours. Fasting Lipid Panel No results for input(s): CHOL, HDL, LDLCALC, TRIG, CHOLHDL, LDLDIRECT in the last 72 hours. Thyroid Function Tests No results for input(s): TSH, T4TOTAL, T3FREE, THYROIDAB in the last 72 hours.  Invalid input(s): FREET3  Other results:   Imaging    No results found.   Medications:     Scheduled Medications: . amiodarone  200 mg Per Tube  BID  . chlorhexidine gluconate (MEDLINE KIT)  15 mL Mouth Rinse BID  . Chlorhexidine Gluconate Cloth  6 each Topical Daily  . clonazepam  0.5 mg Per Tube BID  . feeding supplement (PRO-STAT SUGAR FREE 64)  60 mL Per Tube TID  . feeding supplement (VITAL HIGH PROTEIN)  1,000 mL Per Tube Q24H  . folic acid  1 mg Intravenous Daily  . free water  300 mL Per Tube Q4H  . furosemide  40 mg Intravenous Daily  . heparin injection (subcutaneous)  5,000 Units Subcutaneous Q8H  . insulin aspart  0-9 Units Subcutaneous Q4H  . mouth rinse  15 mL Mouth Rinse 10 times per day  . multivitamin  15  mL Per Tube Daily  . pantoprazole sodium  40 mg Per Tube Q1200  . senna-docusate  2 tablet Per Tube BID  . sodium chloride flush  10-40 mL Intracatheter Q12H  . sodium chloride flush  10-40 mL Intracatheter Q12H  . sodium chloride flush  5 mL Intracatheter Q8H  . thiamine injection  100 mg Intravenous Daily    Infusions: . sodium chloride Stopped (08/26/2018 0614)  . sodium chloride 10 mL/hr at 09/08/18 1100  . fentaNYL infusion INTRAVENOUS Stopped (09/09/18 1028)  . meropenem (MERREM) IV Stopped (09/10/18 0550)    PRN Medications: fentaNYL (SUBLIMAZE) injection, fentaNYL (SUBLIMAZE) injection, metoprolol tartrate, midazolam, sodium chloride flush, sodium chloride flush    Patient Profile   Cody Patterson is a 42 y.o. 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   Presented to [facility] 10/16 with worsening dyspnea. Became hypotensive and with respiratory distress requiring pressor support and intubation. Transferred to [facility] for treatment and further evaluation.   Assessment/Plan   1. Shock: Mixed septic/cardiogenic shock. - H.flu in sputum. Repeat blood cultures NGTD. - Suspect primarily septic shock. WBC remains elevated  - Meropenem stopped today.  - CT ab no evidence of abscess - Off norepi and milrinone. CO-OX pending.  2. Acute on chronic systolic CHF:  Echocardiogram from [facility] 10/16: LV moderately dilated, severely impaired EF at 10%, moderate RV dysfunction, moderately elevated RVSP.  Nonischemic cardiomyopathy pre-existed this hospitalization (had cath at [facility]).  Suspect cardiomyopathy is due to substance abuse (amphetamines).   - Off milrinone and NE.  - Has massive 3rd spacing but holding diuretics with hypotension and severe hypernatremia - If family wants to proceed with aggressive care will need to start trying gentle diuresis soon  - He is not candidate for VAD or transplant with active substance abuse.  3. Acute  respiratory failure: Remains intubated and sedated. Suspect PNA. P/CCM managing. Sputum + H influenzae.  - Family considering trach.  4. Acute CVA - Head CTshowed suspected recent acute to subacute infarct. Neuro consulted. - MRI 10/25 showed acute to early subacute multifocal small RIGHT MCA territory infarcts. MRA showed occluded right ICA with thready intracranial reconstitution; slow flow RIGHT MCA without emergent cerebral artery occlusion. Recommend CTA head and neck. - Appears to have dense left hemiparesis  -Per neuro and primary and goals of care conversation. 5. Polysubstance abuse: UDS + amphetamines and THC. No change.   6. ID: WBC up to 28.  -Septic shock.  Has H influenzae in sputum, suspect PNA.  He also appears to have acute cholecystitis on abdominal US.  - Meropenem stopped today.  - S/p percutaneous cholecystostomy tube 10/20 (per CCM).  - abdominal CT without abscess or other acute issues 7. Acute liver  failure: GI following.  Elevated LFTs.  Suspect ischemic hepatitis with shock + acute cholecystitis. Transaminases trending down but tbili rising again, likely in setting of acute cholecystitis. No change. - s/p perc drain.  - Bilirubin continues to rise.  - CT unremarkable  8. Hypernatremia - Sodium 150 today.  -continue free water boluses. 9. Atrial fibrillation and NSVT, paroxysmal:  - Remains in NSR.  Will continue amio gtt. Dout this is causing cholestatic picture - INR 1.6  He is not on AC. (received vitamin K 10/19 and 10/20 for choley drain)  - on po amio   Palliative following for GOC. Family considering trach.    Length of Stay: [place], NP  09/10/2018, 7:26 AM  Advanced Heart Failure Team Pager [phone] (M-F; 7a - 4p)  Please contact [facility] for night-coverage after hours (4p -7a ) and weekends on [email or web address]  Agree.   Remains critically ill. Awake at times on vent and will follow some commands. Continue with left-sided hemiplegia.  Extremities very cold. Bili continues to rise. Remains in NSR  Intubated sedated Jaundice CVP 10 Cor RRR Lungs coarse Ab soft NT Ext 3+ edema. Cool mottled  Very difficult situation. Remains intubated with low EF, progressive liver failure, dense left hemiparesis. Has said in past he would not want trach/PEG. Would recommend initiation of comfort meds and terminal extubation. Would not extubated without starting drips as he would likely develop respiratory distress.   CRITICAL CARE Performed by: Glori Bickers  Total critical care time: 35 minutes  Critical care time was exclusive of separately billable procedures and treating other patients.  Critical care was necessary to treat or prevent imminent or life-threatening deterioration.  Critical care was time spent personally by me (independent of midlevel providers or residents) on the following activities: development of treatment plan with patient and/or surrogate as well as nursing, discussions with consultants, evaluation of patient's response to treatment, examination of patient, obtaining history from patient or surrogate, ordering and performing treatments and interventions, ordering and review of laboratory studies, ordering and review of radiographic studies, pulse oximetry and re-evaluation of patient's condition.  Glori Bickers, MD  11:45 AM

## 2018-09-10 NOTE — Progress Notes (Signed)
Daily Progress Note   Patient Name: Cody Patterson       Date: 09/10/2018 DOB: 11/10/1976  Age: 42 y.o. MRN#: [id number] Attending Physician: Brand Males, MD Primary Care Physician: Patient, No Pcp Per Admit Date: 08/26/2018   Reason for Consultation/Follow-up: Establishing goals of care  Subjective: Patient remains intubated. Drowsy this morning, likely from prn fentanyl and versed given at 0610. No s/s of pain or discomfort.   GOC:  F/u with family in conference room along with Dr. Elsworth Soho. Dr. Elsworth Soho again discussed course of hospitalization including diagnoses, interventions, and prognosis. Family is not ready to give up on "Bubba." Each day they are seeing signs of improvement in mental status and movement (per brother he wiggled left toes and left hand last night). 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 They believe in the power of prayer and miracles and strongly praying that he will continue to show improvement.  Plan per attending, extubation today once patient is more awake/alert. Monitor respiratory status. If patient declines, family wishes to pursue re-intubation/trach placement. They understand if Cathleen Corti requires trach/continued ventilation, he will require vent SNF placement that could possibly be out of state. Family willing to follow [name] wherever he needs to go to continue to fight. Expressed that he is very weak from course of hospitalization and new stroke. They understand this will be a long road if able to recover. Family understands and plans to be by his side--praying and fighting for him.   Prayed with family. Emotional/spiritual support provided. Hard Choices copy given to Lemmie Evens and Andreas Newport.    Length of Stay:  14  Current Medications: Scheduled Meds:  . chlorhexidine gluconate (MEDLINE KIT)  15 mL Mouth Rinse BID  . Chlorhexidine Gluconate Cloth  6 each Topical Daily  . clonazepam  0.5 mg Per Tube BID  . feeding supplement (PRO-STAT SUGAR FREE 64)  60 mL Per Tube TID  . feeding supplement (VITAL HIGH PROTEIN)  1,000 mL Per Tube Q24H  . folic acid  1 mg Intravenous Daily  . free water  300 mL Per Tube Q4H  . furosemide  40 mg Intravenous Daily  . heparin injection (subcutaneous)  5,000 Units Subcutaneous Q8H  . insulin aspart  0-9 Units Subcutaneous Q4H  .  mouth rinse  15 mL Mouth Rinse 10 times per day  . multivitamin  15 mL Per Tube Daily  . pantoprazole sodium  40 mg Per Tube Q1200  . senna-docusate  2 tablet Per Tube BID  . sodium chloride flush  10-40 mL Intracatheter Q12H  . sodium chloride flush  10-40 mL Intracatheter Q12H  . sodium chloride flush  5 mL Intracatheter Q8H  . thiamine injection  100 mg Intravenous Daily    Continuous Infusions: . sodium chloride Stopped (08/19/2018 0614)  . sodium chloride 10 mL/hr at 09/08/18 1100  . fentaNYL infusion INTRAVENOUS Stopped (09/09/18 1028)  . meropenem (MERREM) IV Stopped (09/10/18 0550)    PRN Meds: fentaNYL (SUBLIMAZE) injection, fentaNYL (SUBLIMAZE) injection, metoprolol tartrate, midazolam, sodium chloride flush, sodium chloride flush  Physical Exam  Constitutional: He is easily aroused. He appears ill. He is intubated.  HENT:  Head: Normocephalic and atraumatic.  Cardiovascular: Regular rhythm.  Pulmonary/Chest: No accessory muscle usage. No tachypnea. He is intubated. No respiratory distress. He has decreased breath sounds.  Abdominal: There is no tenderness.  Perc drain  Neurological: He is easily aroused.  Drowsy this AM  Skin: Skin is warm and dry.  jaundice  Nursing note and vitals reviewed.          Vital Signs: BP (!) 111/97   Pulse (!) 101   Temp 99.3 F (37.4 C)   Resp (!) 21   Ht 6' (1.829 m)   Wt 106.4  kg   SpO2 97%   BMI 31.81 kg/m  SpO2: SpO2: 97 % O2 Device: O2 Device: Ventilator O2 Flow Rate:    Intake/output summary:   Intake/Output Summary (Last 24 hours) at 09/10/2018 1037 Last data filed at 09/10/2018 1011 Gross per 24 hour  Intake 4345.59 ml  Output 3121 ml  Net 1224.59 ml   LBM: Last BM Date: 09/09/18 Baseline Weight: Weight: 92 kg Most recent weight: Weight: (no longer on milrinone)       Palliative Assessment/Data: PPS 30%   Flowsheet Rows     Most Recent Value  Intake Tab  Referral Department  Critical care  Unit at Time of Referral  ICU  Palliative Care Primary Diagnosis  Cardiac  Date Notified  09/05/18  Palliative Care Type  New Palliative care  Reason for referral  Clarify Goals of Care  Date of Admission  08/14/2018  Date first seen by Palliative Care  09/06/18  # of days Palliative referral response time  1 Day(s)  # of days IP prior to Palliative referral  9  Clinical Assessment  Palliative Performance Scale Score  30%  Psychosocial & Spiritual Assessment  Palliative Care Outcomes  Patient/Family meeting held?  Yes  Who was at the meeting?  wife  Palliative Care Outcomes  Clarified goals of care, Provided psychosocial or spiritual support, Changed CPR status, ACP counseling assistance      Patient Active Problem List   Diagnosis Date Noted  . Elevated LFTs   . Cholecystitis   . Goals of care, counseling/discussion   . Palliative care by specialist   . Cerebral embolism with cerebral infarction 09/04/2018  . Acute respiratory failure ([place])   . Cardiogenic shock ([place]) 09/01/2018    Palliative Care Assessment & Plan   Patient Profile: 42 y.o. male  with past medical history of substance abuse, HTN, advanced systolic heart failure likely d/t amphetamine use admitted on 08/16/2018 with increasing dyspnea that required intubation. Patient found to have acute on chronic heart failure.  Echo on admission revealed EF of 10%. He is not candidate  for VAD or transplant d/t substance abuse. Patient being treated for shock - mixed septic and cardiogenic. MRI on 10/25 revealed small right MCA infarct. Sputum positive for H. Influenzae. Appears to have pna and acute cholecystitis. Perc chole drain placed 10/20. Now spiking fevers. PMT consulted by CCM for [place].  Assessment: Septic/cardiogenic shocek Acute on chronic systolic CHF with EF 06% Acute respiratory failure Acute right MCA CVA Polysubstance abuse Metabolic encephalopathy H. Influenzae pneumonia Pulmonary edema Acute cholecystitis Acute liver failure Acute kidney injury  Recommendations/Plan:  Continue FULL code/FULL scope treatment.   F/u with Dr. Elsworth Soho with multiple family members including wife, Lemmie Evens. Family desires continued FULL code/FULL scope treatment including trach/peg if necessary. Plan for extubation today per attending. If patient declines, plan will be for reintubation/trach placement. Family is not ready to give up on Bubba and fighting/praying for a miracle.   Goals of Care and Additional Recommendations:  Limitations on Scope of Treatment: Full Scope Treatment  Code Status: FULL   Code Status Orders  (From admission, onward)         Start     Ordered   09/07/18 1456  Full code  Continuous     09/07/18 1455        Code Status History    Date Active Date Inactive Code Status Order ID Comments User Context   09/06/2018 1507 09/07/2018 1455 DNR [id number]  Philis Pique, NP Inpatient   09/06/2018 1918 09/06/2018 1507 Full Code [id number]  Kipp Brood, MD Inpatient       Prognosis:   Guarded  Discharge Planning:  To Be Determined  Care plan was discussed with RN, Dr. Elsworth Soho, patient's wife and gf, multiple family members  Thank you for allowing the Palliative Medicine Team to assist in the care of this patient.   Time In: 0945 Time Out: 1030 Total Time 7mn Prolonged Time Billed no      Greater than 50%  of this time was spent  counseling and coordinating care related to the above assessment and plan.  MIhor Dow FNP-C Palliative Medicine Team  Phone: [phone]: [phone] Please contact Palliative Medicine Team phone at [phone] questions and concerns.

## 2018-09-10 NOTE — Progress Notes (Signed)
Progress Note   Subjective  Bilirubin continues to trend up. No significant overnight events. Remains intubated.   Objective   Vital signs in last 24 hours: Temp:  [98.8 F (37.1 C)-100.8 F (38.2 C)] 100.2 F (37.9 C) (10/30 0700) Pulse Rate:  [95-119] 108 (10/30 0600) Resp:  [0-29] 0 (10/30 0700) BP: (101-155)/(83-124) 101/83 (10/30 0700) SpO2:  [96 %-99 %] 99 % (10/30 0600) FiO2 (%):  [30 %] 30 % (10/30 0315) Last BM Date: 09/09/18 General:    AA male, intubated and sedated Heart:  tachycardic Lungs: Respirations even and unlabored, intubated Abdomen:  Soft, nondistended.  Extremities:  mild edema. Neurologic:  sedated  Intake/Output from previous day: 10/29 0701 - 10/30 0700 In: 2740.3 [I.V.:1729.3; NG/GT:905; IV Piggyback:101] Out: 3121 [Urine:3050; Drains:71] Intake/Output this shift: No intake/output data recorded.  Lab Results: Recent Labs    09/08/18 0335 09/10/18 0312  WBC 22.9* 28.0*  HGB 14.7 16.3  HCT 48.2 51.2  PLT 111* 161   BMET Recent Labs    09/08/18 2243 09/09/18 0501 09/10/18 0312  NA 154* 155* 150*  K 4.2 3.9 3.6  CL 121* 119* 113*  CO2 29 32 27  GLUCOSE 124* 120* 165*  BUN 66* 60* 61*  CREATININE 1.09 1.10 1.04  CALCIUM 8.4* 8.3* 8.3*   LFT Recent Labs    09/10/18 0312  PROT 6.0*  ALBUMIN 1.9*  AST 141*  ALT 180*  ALKPHOS 85  BILITOT 26.6*  BILIDIR 18.0*  IBILI 8.6*   PT/INR Recent Labs    09/09/18 0501 09/10/18 0312  LABPROT 18.4* 19.0*  INR 1.55 1.62    Studies/Results: Dg Chest Port 1 View  Result Date: 09/09/2018 CLINICAL DATA:  Acute respiratory failure. EXAM: PORTABLE CHEST 1 VIEW COMPARISON:  Radiograph of September 08, 2018. FINDINGS: Stable cardiomediastinal silhouette. Endotracheal and nasogastric tubes are in grossly good position. Right subclavian catheter and PICC line are unchanged in position. No acute pulmonary disease is noted. No pneumothorax or pleural effusion is noted. Bony thorax is  unremarkable. IMPRESSION: Stable support apparatus.  No acute abnormality seen. Electronically Signed   By: Lupita Raider, M.D.   On: 09/09/2018 09:37   [place] Ekg Site Rite  Result Date: 09/08/2018 If Site Rite image not attached, placement could not be confirmed due to current cardiac rhythm.      Assessment / Plan:    42 y/o male with history of substance abuse admitted with cardiogenic shock, on ventilator / pressors, septic shock due to H influenza PNA, developed shock liver during his course, has multifocal strokes in R MCA distribution. Also s/p cholecystostomy tube placement on 10/20 for possible cholecystitis. Now with rising bilirubin, mostly direct, with fevers.  He clearly had shock liver earlier in his course with transaminases > 10K. These have continued to downtrend. INR stable. Serologic workup negative for hep B/C, autoimmune, Wilson's, etc. CMV pending, but monospot positive. Mono can cause fever and jaundice but this could be a false positive lab as well - supportive care either way. No anemia but haptoglobin low and LDH elevated, so possible component of hemolysis.  Overall, he most likely has cholestasis of sepsis at this point, with resolving ischemic injury shock liver (with delayed hyperbilirubinemia a bit later than usual however). His AP has been normal and [place] and CT showed no biliary ductal dilation and he has a cholecystostomy tube in place - I think biliary obstruction is unlikely but will repeat an [place] today to re-evaluate  his biliary tree in light of rising bilirubin. DILI also a consideration (amiodarone use), but acute amiodarone toxicity is rare and is more hepatocellular in nature.   Will follow, call with questions.  Ileene Patrick, MD [facility]

## 2018-09-10 NOTE — Progress Notes (Addendum)
Nutrition Follow Up  DOCUMENTATION CODES:   Obesity unspecified  INTERVENTION:     Vital High Protein at goal rate 35 ml/hr (840 ml per day) with Prostat 60 ml TID   Provides 1440 kcals, 163 gm protein, 702 ml of free water daily   Liquid MVI daily via tube  NUTRITION DIAGNOSIS:   Inadequate oral intake related to inability to eat as evidenced by NPO status, ongoing  GOAL:   Provide needs based on ASPEN/SCCM guidelines, met  MONITOR:   Vent status, TF tolerance, Labs, Skin, Weight trends, I & O's  ASSESSMENT:   42 yo Male with PMH 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 Presented to [facility] with increasing dyspnea. Acute decompensation with hypotension and severe respiratory distress requiring intubation. Transferred to [facility]  Patient is currently intubated on ventilator support Temp (24hrs), Avg:100.1 F (37.8 C), Min:99.3 F (37.4 C), Max:100.8 F (38.2 C)  Pt sedated, family outside of pt's room. TF (Vital HP) on hold for procedure.  Acute liver injury/shock. For repeat abd [place] today. Suspected recent acute to subacute infarction. Recent CXR showed clearing of infiltrates.  Free water flushes at 300 ml every 4 hours. Labs reviewed. Mg 2.6 (H). Na 150 (H). CBG's [id number].  Palliative Medicine Team notes reviewed. Family wants full scope treatment. Okay with trach/PEG tube.  Diet Order:   Diet Order            Diet NPO time specified  Diet effective now             EDUCATION NEEDS:   Not appropriate for education at this time  Skin:  Skin Assessment: Reviewed RN Assessment  Last BM:  10/29   Intake/Output Summary (Last 24 hours) at 09/10/2018 1154 Last data filed at 09/10/2018 1145 Gross per 24 hour  Intake 4297.66 ml  Output 3396 ml  Net 901.66 ml   Height:   Ht Readings from Last 1 Encounters:  08/30/18 6' (1.829 m)   Weight:   Wt Readings from Last 1 Encounters:  09/09/18  106.4 kg   Ideal Body Weight:  81 kg  BMI:  Body mass index is 31.81 kg/m.  Estimated Nutritional Needs:   Kcal:  9381-0175  Protein:  >/= 162 gm  Fluid:  per MD  Arthur Holms, RD, LDN Pager #: [phone] After-Hours Pager #: [phone]

## 2018-09-10 NOTE — Progress Notes (Signed)
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 influenzae pneumonia. Course complicated by left hemiplegia due to acute right MCA CVA and acute liver injury attributed to shock liver.  Ongoing discussions with family to define care limitations. On exam-sedated, given fentanyl this morning per RN, low-grade febrile, appears weak and deconditioned, 2+ edema, decreased breath sounds bilateral, minimal secretions, S1-S2 normal  Chest x-ray reviewed which shows clearing of infiltrates Labs show sodium is decreased to 150, bilirubin remains high at 26 with predominant direct at 18 and worsening leukocytosis  Impression/plan Acute respiratory failure-was tolerating pressure support 5/5 yesterday, my goal would be trial of extubation for him Clarified with the family, if he fails then would need reintubation and they would be agreeable to tracheostomy although they understand that this is not what he wished but given alternative of death, he would be willing to accept tracheostomy in that situation. Unfortunately does not appear to be doing well today which may be related to sedation.  Acute liver injury/shock liver-GI following, will discontinue amiodarone  Right MCA stroke-not clear whether he will rehab and what improvement expect Hypernatremia-has improved, decrease D5W to 75/hour Acute encephalopathy-discontinue fentanyl drip and use fentanyl intermittently, Precedex has not worked well for him in the past.  Fevers with leukocytosis-may be related to liver injury, lines have been discontinued but will continue with empiric antibiotics  Updated entire family including wife, girlfriend, sister and brother in detail with palliative care present to decide on goals of care.  My critical care time x 20m  Cyril Mourning MD. FCCP. [facility] Pager [phone] If no response call [phone]  [phone]   09/10/2018

## 2018-09-11 ENCOUNTER — Inpatient Hospital Stay (HOSPITAL_COMMUNITY): Payer: Medicaid Other

## 2018-09-11 LAB — BASIC METABOLIC PANEL
ANION GAP: 6 (ref 5–15)
BUN: 78 mg/dL — ABNORMAL HIGH (ref 6–20)
CALCIUM: 8.2 mg/dL — AB (ref 8.9–10.3)
CHLORIDE: 115 mmol/L — AB (ref 98–111)
CO2: 30 mmol/L (ref 22–32)
Creatinine, Ser: 1.48 mg/dL — ABNORMAL HIGH (ref 0.61–1.24)
GFR calc non Af Amer: 58 mL/min — ABNORMAL LOW (ref >60)
GLUCOSE: 107 mg/dL — AB (ref 70–99)
Potassium: 4.4 mmol/L (ref 3.5–5.1)
Sodium: 151 mmol/L — ABNORMAL HIGH (ref 135–145)

## 2018-09-11 LAB — PROCALCITONIN: PROCALCITONIN: 5.85 ng/mL

## 2018-09-11 LAB — CBC
HEMATOCRIT: 48.6 % (ref 39.0–52.0)
Hemoglobin: 15.9 g/dL (ref 13.0–17.0)
MCH: 27.8 pg (ref 26.0–34.0)
MCHC: 32.7 g/dL (ref 30.0–36.0)
MCV: 85 fL (ref 80.0–100.0)
NRBC: 0.2 % (ref 0.0–0.2)
PLATELETS: 232 10*3/uL (ref 150–400)
RBC: 5.72 MIL/uL (ref 4.22–5.81)
RDW: 25.9 % — ABNORMAL HIGH (ref 11.5–15.5)
WBC: 26.8 10*3/uL — ABNORMAL HIGH (ref 4.0–10.5)

## 2018-09-11 LAB — URIC ACID: URIC ACID, SERUM: 4 mg/dL (ref 3.7–8.6)

## 2018-09-11 LAB — HEPATIC FUNCTION PANEL
ALT: 167 U/L — AB (ref 0–44)
AST: 153 U/L — AB (ref 15–41)
Albumin: 1.8 g/dL — ABNORMAL LOW (ref 3.5–5.0)
Alkaline Phosphatase: 83 U/L (ref 38–126)
BILIRUBIN DIRECT: 18.4 mg/dL — AB (ref 0.0–0.2)
BILIRUBIN INDIRECT: 11.5 mg/dL — AB (ref 0.3–0.9)
BILIRUBIN TOTAL: 29.9 mg/dL — AB (ref 0.3–1.2)
Total Protein: 6 g/dL — ABNORMAL LOW (ref 6.5–8.1)

## 2018-09-11 LAB — DIRECT ANTIGLOBULIN TEST (NOT AT ARMC)
DAT, IGG: NEGATIVE
DAT, complement: NEGATIVE

## 2018-09-11 LAB — COOXEMETRY PANEL
CARBOXYHEMOGLOBIN: 1.2 % (ref 0.5–1.5)
Methemoglobin: 1.2 % (ref 0.0–1.5)
O2 SAT: 60.8 %
TOTAL HEMOGLOBIN: 16.5 g/dL — AB (ref 12.0–16.0)

## 2018-09-11 LAB — ABO/RH: ABO/RH(D): B POS

## 2018-09-11 LAB — MONONUCLEOSIS SCREEN: MONO SCREEN: NEGATIVE

## 2018-09-11 LAB — PROTIME-INR
INR: 1.62
PROTHROMBIN TIME: 19.1 s — AB (ref 11.4–15.2)

## 2018-09-11 LAB — CMV DNA, QUANTITATIVE, PCR
CMV DNA Quant: POSITIVE IU/mL
Log10 CMV Qn DNA Pl: UNDETERMINED log10 IU/mL

## 2018-09-11 LAB — GLUCOSE, CAPILLARY
GLUCOSE-CAPILLARY: 121 mg/dL — AB (ref 70–99)
GLUCOSE-CAPILLARY: 127 mg/dL — AB (ref 70–99)
GLUCOSE-CAPILLARY: 156 mg/dL — AB (ref 70–99)
Glucose-Capillary: 99 mg/dL (ref 70–99)
Glucose-Capillary: 116 mg/dL — ABNORMAL HIGH (ref 70–99)
Glucose-Capillary: 110 mg/dL — ABNORMAL HIGH (ref 70–99)

## 2018-09-11 LAB — MAGNESIUM: Magnesium: 2.8 mg/dL — ABNORMAL HIGH (ref 1.7–2.4)

## 2018-09-11 MED ORDER — DEXTROSE 5 % IV SOLN
INTRAVENOUS | Status: DC
  Administered 2018-09-11 – 2018-09-13 (×5): via INTRAVENOUS

## 2018-09-11 MED ORDER — VITAMIN K1 10 MG/ML IJ SOLN
5.0000 mg | Freq: Once | INTRAVENOUS | Status: AC
  Administered 2018-09-11: 5 mg via INTRAVENOUS
  Filled 2018-09-11: qty 0.5

## 2018-09-11 MED ORDER — FENTANYL 2500MCG IN NS 250ML (10MCG/ML) PREMIX INFUSION: 25.0000 ug/h | INTRAVENOUS | Status: DC

## 2018-09-11 MED ORDER — HYDRALAZINE HCL 25 MG PO TABS: 25.0000 mg | ORAL_TABLET | Freq: Three times a day (TID) | ORAL | Status: DC

## 2018-09-11 MED ORDER — SODIUM CHLORIDE 0.9% IV SOLUTION: Freq: Once | INTRAVENOUS | Status: DC

## 2018-09-11 MED ORDER — LACTULOSE 10 GM/15ML PO SOLN
20.0000 g | Freq: Two times a day (BID) | ORAL | Status: DC
  Administered 2018-09-11 – 2018-09-13 (×5): 20 g
  Filled 2018-09-11 (×5): qty 30

## 2018-09-11 NOTE — Progress Notes (Signed)
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 Presented to [facility] with increasing dyspnea. Acute decompensation with hypotension and severe respiratory distress requiring intubation. Transferred to [facility] for management of sepsis, advanced heart failure.  Growing H influenza in sputum.  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  Past Medical History  Known cardiomyopathy and drug abuse (THC and methamphetamines)  Significant Hospital Events   10/16- Intubated at [facility] 10/17- Worsening shock, maxed on pressors, right heart cath indicative of distributive shock, high output failure 10/18-Started on milrinone, lasix for reduced cardiac output. PA numbers show worsening CO and high wedge 10/19- Started making urine, pressors weaning down, continues on milrinone. Surgery consulted for cholecystitis 10/20- Mucus plug overnight requiring bag, lavage. Went into afib.  Perc chole drain placed by IR 10/23 Start SBT trials, failed due to tachypnea 10/24 [facility] type respirations > CT head which showed stroke 10/26 -  spiking fevers 103 - swinging fevers since 09/03/18. Rising bilirubin. Rpt [place] c/w Acute choley - per IR yesterday - GB drain working well. CT chest/abd - GB drain ok. Stoool + . No abscess CCS suspecting this is cholestasis; not a candidate for surgery. Total Bili > IndirectBili. Remains on amio gtt, milrinone, Of levophed . MADE DNR by Palliative care 09/07/18 - fever + by peak of curve < 103F WBC down trend.  10/28 lines removed with  defervesce 10/30 stopped amio  Consults: date of consult/date signed off & final recs:  10/17- Cardiology  10/19- Surgery signed on 09/02/2017 10/24 Neurology 10/26 - pallaitive  Procedures (surgical and bedside):  10/16 [facility] R subclavian CVC >> 10/28 10/16 [facility] L femoral arterial line >> out  10/17 PA catheter >> 10/28 10/20 Perc chole drain RUE PICC 10/28 >>  Significant Diagnostic Tests:  Echocardiogram 10/17- Mild to moderate LV dilation with EF 20%, diffuse hypokinesis.  Restrictive diastolic function. Mildly dilated RV with mildly decreased systolic function. No significant valvular abnormalities Abdominal ultrasound 08/29/2018- severe gallbladder thickening, sludge, cholelithiasis, small volume of perihepatic, pericholecystic and perinephric free fluid. UDS [facility] 10/16- Amphetamines, THC [place] 10/18 Initial numbers RA 10 PA 46/33 (41) PCWP 10 Thermo CO/CI  15.2/7.0 Fick 14.1/6.5 (co-ox 86%) SVR 231 CT head 10/24 > small area of hypoattenuation in right parietal lobe suspicious for recent acute to subacute infarct. 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  Occluded R ICA, slow flow R MCA.  CT chest / abd 10/24 >> mild GGO, bibasal consolidation, perc drain well positioned , no other abd cause of fever  Micro Data:  Bcx 10/23, 10/25 >> ng Bcx 10/17 >> ng Trach aspirate 10/17- H influenza MRSA PR 10/16 - positive Blood 10/22 - neg as of 10/27 Trach aspirate 10/25 - moderate yeast   Antimicrobials:  Vancomycin 10/17 > 10/21. 10/24 > 10/29 Meropenen 10/17 > 10/31 Aztreonam 10/17>> off  SUBJECTIVE/OVERNIGHT/INTERVAL HX    He remains critically ill, less responsive last 2 days than he was on 10/29 Not tolerating spontaneous breathing due to high respiratory rate Afebrile    Objective  Blood pressure 112/88, pulse (!) 113, temperature 98.1 F (36.7 C), temperature  source Oral, resp. rate (!) 34, height 6' (1.829 m), weight 103.3 kg, SpO2 98 %. CVP:  [9 mmHg] 9 mmHg  Vent Mode: PRVC FiO2 (%):  [30 %] 30 % Set Rate:  [14 bmp] 14 bmp Vt Set:  [540 mL] 540 mL PEEP:  [5 cmH20] 5 cmH20 Pressure Support:  [8 cmH20] 8 cmH20 Plateau Pressure:  [11 cmH20-21 cmH20] 21 cmH20   Intake/Output Summary (Last 24 hours) at 09/11/2018 0939 Last data filed at 09/11/2018 0800 Gross per 24 hour  Intake 1912.11 ml  Output 1960 ml  Net -47.89 ml   Filed Weights   09/08/18 0500 09/09/18 0500 09/11/18 0500  Weight: 104.9 kg 106.4 kg 103.3 kg   Acutely ill young man, obviously jaundiced, diaphoretic Mild pallor, no JVD, 2+ edema Decreased breath sounds bilateral, tachypneic and spontaneous breathing S1-S2 regular, no murmur Soft nontender abdomen Does not follow commands, pupils 3 mm bilaterally equally reactive to light.  Chest x-ray from 10/30 personally reviewed which shows clearing of infiltrates and bibasilar atelectasis    Assessment & Plan:   #Shock - Cardiogenic (AoC sCHF with cardiomyopathy and EF 10%), septic combination.  Severe cardiomyopathy, noncompliant. V tach, A. Fib (resolved with amiodarone).  PLAN - per cards;off milrionone Gt - off amio since 10/30 , will use rate control if RVR -Not a candidate for LVAD due to substance abuse hx.  -    #Acute respiratory failure secondary to pulmonary edema and H. influenzae pneumonia. -Weaning limited by agitation and tachypnea   -Discussed tracheostomy with family & will proceed when medically ready   #Severe sepsis-H. influenzae pneumonia, +/ Acute cholecystitis. Procalcitonin trended down to 2.4 on 10/24 then rising again, Discontinued all lines   PLAN dc merrem if pct low    #Acute metabolic encephalopathy-related to sedation + ICU delirium + new stroke + liver failure  PLAN fent int RASS goal 0  Dc clonazepam since he has been less responsive last 2 days, chk ammonia  Precedex  did not work well earlier  [employer] MCA stroke. - Neurology following. -  Needs aggressive rehab when liberated from vent  #AKI - improved but rising again -  Continue to follow urine output and creatinine. - Diuresis as able per cards  #Hypernatremia. - free water increased to 300 every 4 -resume D5W @ 50/h  #Acute liver failure /Elevated LFTs- s/p perc drain 10/20 doubt Acute cholecystitis  Etiology still unclear, GI input appreciated, seems to be direct bilirubinemia  trend atypical for  shock liver , drain seems to be working well Have stopped amio   PLAN - Continue drain x 4 - 6 weeks then can determine if surgery will be necessary. -Follow LFT -chk ammonia     Disposition / Summary of Today's Plan 09/11/18   Unfortunately he has worsened again last 2 days from an encephalopathy standpoint and would be best to proceed with tracheostomy.  We will stop clonazepam correct hypernatremia and check for ammonia levels as causes. Liver failure continues to worsen  Best Practice    Code Status: DNR revoked by wife Family Communication:  Wife girlfriend, sister and brother all involved in palliative discussions.  They want to proceed with tracheostomy   ATTESTATION & SIGNATURE   The patient is critically ill with multiple organ systems failure and requires high complexity decision making for assessment and support, frequent evaluation and titration of therapies, application of advanced monitoring technologies and  extensive interpretation of multiple databases. Critical Care Time devoted to patient care services described in this note independent of APP/resident  time is 33 minutes.   Cyril Mourning MD. Tonny Bollman. [facility] Pager [phone] If no response call [phone]    09/11/2018

## 2018-09-11 NOTE — Progress Notes (Signed)
Progress Note   Subjective  Patient continues to do poorly, full vent support, rising bilirubin, encephalopathic   Objective   Vital signs in last 24 hours: Temp:  [96.8 F (36 C)-99.9 F (37.7 C)] 98.1 F (36.7 C) (10/31 0300) Pulse Rate:  [99-117] 113 (10/31 0842) Resp:  [0-35] 34 (10/31 0842) BP: (95-133)/(76-121) 112/88 (10/31 0842) SpO2:  [94 %-100 %] 100 % (10/31 1106) FiO2 (%):  [30 %] 30 % (10/31 1106) Weight:  [103.3 kg] 103.3 kg (10/31 0500) Last BM Date: 09/10/18 General:    white male intubated Heart:  tachy Lungs: Respirations even and unlabored, Abdomen:  Soft, and nondistended. . Extremities:  (+) peripheral edema. Neurologic:  sedated  Intake/Output from previous day: 10/30 0701 - 10/31 0700 In: 3767.1 [I.V.:233.1; NG/GT:3529] Out: 2085 [Urine:2020; Drains:65] Intake/Output this shift: Total I/O In: 345 [I.V.:10; NG/GT:335] Out: 175 [Urine:175]  Lab Results: Recent Labs    09/10/18 0312 09/11/18 0404  WBC 28.0* 26.8*  HGB 16.3 15.9  HCT 51.2 48.6  PLT 161 232   BMET Recent Labs    09/09/18 0501 09/10/18 0312 09/11/18 0404  NA 155* 150* 151*  K 3.9 3.6 4.4  CL 119* 113* 115*  CO2 32 27 30  GLUCOSE 120* 165* 107*  BUN 60* 61* 78*  CREATININE 1.10 1.04 1.48*  CALCIUM 8.3* 8.3* 8.2*   LFT Recent Labs    09/11/18 0404  PROT 6.0*  ALBUMIN 1.8*  AST 153*  ALT 167*  ALKPHOS 83  BILITOT 29.9*  BILIDIR 18.4*  IBILI 11.5*   PT/INR Recent Labs    09/10/18 0312 09/11/18 0404  LABPROT 19.0* 19.1*  INR 1.62 1.62    Studies/Results: Dg Chest Port 1 View  Result Date: 09/10/2018 CLINICAL DATA:  42 year old male with respiratory failure, intubated EXAM: PORTABLE CHEST 1 VIEW COMPARISON:  Prior chest x-ray yesterday, 09/09/2018 FINDINGS: The patient remains intubated. The tip of the endotracheal tube is 5.4 cm above the carina. Right subclavian central venous catheter has been removed. A right upper extremity PICC is present.  The tip overlies the cavoatrial junction. Gastric tube also present. The tip lies below the field of view, presumably within the stomach. Stable cardiomegaly. Mediastinal contours remain normal. Relatively low inspiratory volumes with mild bibasilar atelectasis. No pulmonary edema, pneumothorax or large effusion. No focal airspace consolidation. IMPRESSION: Persistently low inspiratory volumes with mild bibasilar atelectasis. Stable cardiomegaly. Interval removal of right subclavian central venous catheter. Other support apparatus in stable and satisfactory position. Electronically Signed   By: Jacqulynn Cadet M.D.   On: 09/10/2018 10:50   US Abdomen Limited Ruq  Result Date: 09/10/2018 CLINICAL DATA:  Jaundice; recent percutaneous cholecystostomy EXAM: ULTRASOUND ABDOMEN LIMITED RIGHT UPPER QUADRANT COMPARISON:  Abdominal ultrasound of October 25th 2019 FINDINGS: Gallbladder: The gallbladder is nondistended and contains echogenic bile or sludge. A cholecystostomy drainage tube is present. Common bile duct: Diameter: 4.3 mm. No stones or sludge are observed within the gallbladder. Liver: There is free fluid surrounding the liver. The hepatic parenchyma exhibits normal echotexture. There is no intrahepatic ductal dilation. Portal vein is patent on color Doppler imaging with normal direction of blood flow towards the liver. IMPRESSION: No common bile duct or intrahepatic ductal dilation. No evidence of choledocholithiasis. Nondistended gallbladder with a cholecystostomy tube present. There is sludge within the gallbladder lumen. Normal appearing liver. Considerable free fluid within the abdomen similar to that seen on the CT scan of September 05, 2018. Electronically Signed   By:  David  Martinique M.D.   On: 09/10/2018 12:22       Assessment / Plan:   42 y/o male with history of substance abuse admitted with cardiogenic shock, on ventilator / pressors,septic shock due to H influenza PNA,developed shock liver  during his course, has multifocal strokes in R MCA distribution. Also s/p cholecystostomy tube placement on 10/20 for possible cholecystitis. Has continued rising bilirubin, mostly direct.  Please see my prior notes for full discussion of his case. I have discussed his case with a Hepatology colleague at another hospital. The most likely diagnosis is cholestasis of sepsis in the setting of resolving ischemic injury / shock liver. That being said, he is worsening and is critically ill with high mortality. At his age Wilson's remains possible to present like this despite normal ceruloplasmin and while the likelihood of having this is low in relation to more likely diagnosis as above, I would send additional workup for it (can be associated with hemolytic anemia, and normal alk phos in the setting of bilirubinemia) given he continues to worsen and critically ill. Family wants full care continued, will see if he can have an eye exam to assess for Kayser-Fleischer rings, will check uric acid and 24 hour urine copper. Will complete autoimmune workup but I think it very unlikely he has autoimmune hepatitis. Other viral labs pending. US shows no clot or biliary ductal dilation. Amiodarone has been stopped. Continue supportive care otherwise.  [employer] Cellar, MD [facility]

## 2018-09-11 NOTE — Consult Note (Signed)
Chief Complaint/Reason for Consultation: Check for Kayser-Fleischer ring  HPI: 42 yo M PMH polysubstance abuse admitted in cardiogenic shock, septic shock due to H influenza PNA, and liver failure presents for consultation to rule out Kayser-Fleischer ring. GI requested ophthalmology exam to examine the possibility of Wilson's disease. The patient has a normal ceruloplasmin. 24 hour urine copper pending. Total bilirubin near 30.   ROS: unobtainable  Past Medical History:  Diagnosis Date  . Cardiomyopathy ([place])   . Heart failure ([place])   . Polysubstance abuse ([place])    THC and Amphetamines   No current facility-administered medications on file prior to encounter.    No current outpatient medications on file prior to encounter.   Allergies  Allergen Reactions  . Amoxicillin Swelling  . Penicillins Swelling  No family history on file.  Examination General: patient lying in bed unresponsive, intubated  [facility] unable to obtain OU  Pupils: Equal, round, reactive, no APD OU T(Pen) Od:  5  mm Hg Os:  9  mm Hg  CVF: unable to assess EOM: unable to assess  Ext/Lids: no significant lagophthalmos OU Conj/Scler: severely icteric OU K: clear OU, no Kayser-Fleischer ring OU Ac: Deep and Quiet OU Iris: Round and Flat OU Lens: Clear OU, no sunflower cataract OU  Dilation deferred due to encephalopathic status/ICU patient/potential future need to assess pupils  Undilated fundus exam Vit: Clear centrally OU Disc: sharp and pink with 0.3 c/d OU and peripapillary atrophy OU Mac: grossly flat OU Vess: normal distribution OU Periphery: not visible undilated OU  Labs/Imaging:   Imp/Plan: 1. Examination for Kayser-Fleischer ring or Sunflower cataract OU -  Ceruloplasmin within normal range - 24 hour urine copper pending -  Total bilirubin near 30 - No evidence of Kayser-Fleischer ring or Sunflower cataract to suggest Wilson's disease or any other cause of ocular chalcosis. - recommend  continued ocular lubrication to protect the ocular surface   Lonia Skinner, M.D. Ophthalmology [facility]

## 2018-09-11 NOTE — Progress Notes (Signed)
Referring Physician(s): Dr. Vaughan Browner  Supervising Physician: Dr. Kathlene Cote  Patient Status:  [facility] - In-pt  Chief Complaint: Cholecystitis  Subjective: Remains intubated.   Cholecystostomy remains in place.  Tbili cont to trend up, now 29.9 [place] yesterday showed no evidence of ductal obstruction or dilatation. Brother at bedside  Allergies: Amoxicillin and Penicillins  Medications:  Current Facility-Administered Medications:  .  0.9 %  sodium chloride infusion, 250 mL, Intravenous, Continuous, Kipp Brood, MD, Stopped at 09/11/2018 [phone] .  0.9 %  sodium chloride infusion, , Intravenous, Continuous, Bensimhon, Shaune Pascal, MD, Last Rate: 10 mL/hr at 09/11/18 0700 .  chlorhexidine gluconate (MEDLINE KIT) (PERIDEX) 0.12 % solution 15 mL, 15 mL, Mouth Rinse, BID, Mannam, Praveen, MD, 15 mL at 09/11/18 0749 .  Chlorhexidine Gluconate Cloth 2 % PADS 6 each, 6 each, Topical, Daily, Brand Males, MD, 6 each at 09/10/18 0600 .  clonazePAM (KLONOPIN) disintegrating tablet 0.5 mg, 0.5 mg, Per Tube, BID, Rigoberto Noel, MD, 0.5 mg at 09/10/18 2208 .  feeding supplement (PRO-STAT SUGAR FREE 64) liquid 60 mL, 60 mL, Per Tube, TID, Brand Males, MD, 60 mL at 09/10/18 2208 .  feeding supplement (VITAL HIGH PROTEIN) liquid 1,000 mL, 1,000 mL, Per Tube, Q24H, Ramaswamy, Murali, MD, 1,000 mL at 09/11/18 0200 .  fentaNYL (SUBLIMAZE) injection 100 mcg, 100 mcg, Intravenous, Q15 min PRN, Brand Males, MD, 100 mcg at 09/10/18 0134 .  fentaNYL (SUBLIMAZE) injection 50-100 mcg, 50-100 mcg, Intravenous, Q2H PRN, Rigoberto Noel, MD, 100 mcg at 09/11/18 0002 .  folic acid injection 1 mg, 1 mg, Intravenous, Daily, Minor, Grace Bushy, NP, 1 mg at 09/10/18 1010 .  free water 300 mL, 300 mL, Per Tube, Q4H, Kara Mead V, MD, 300 mL at 09/11/18 0750 .  furosemide (LASIX) injection 40 mg, 40 mg, Intravenous, Daily, Kara Mead V, MD, 40 mg at 09/10/18 1010 .  heparin injection 5,000 Units, 5,000 Units,  Subcutaneous, Q8H, Desai, Rahul P, PA-C, 5,000 Units at 09/11/18 0603 .  insulin aspart (novoLOG) injection 0-9 Units, 0-9 Units, Subcutaneous, Q4H, Deterding, Guadelupe Sabin, MD, 2 Units at 09/10/18 0351 .  MEDLINE mouth rinse, 15 mL, Mouth Rinse, 10 times per day, Mannam, Praveen, MD, 15 mL at 09/11/18 0605 .  meropenem (MERREM) 2 g in sodium chloride 0.9 % 100 mL IVPB, 2 g, Intravenous, Q8H, Carney, Jessica C, RPH, Last Rate: 200 mL/hr at 09/11/18 0603, 2 g at 09/11/18 0603 .  metoprolol tartrate (LOPRESSOR) injection 2.5-5 mg, 2.5-5 mg, Intravenous, Q3H PRN, Laverle Hobby, MD, 2.5 mg at 09/11/18 0003 .  midazolam (VERSED) injection 2 mg, 2 mg, Intravenous, Q2H PRN, Deterding, Guadelupe Sabin, MD, 2 mg at 09/11/18 0207 .  multivitamin liquid 15 mL, 15 mL, Per Tube, Daily, Ramaswamy, Murali, MD, 15 mL at 09/10/18 1010 .  pantoprazole sodium (PROTONIX) 40 mg/20 mL oral suspension 40 mg, 40 mg, Per Tube, Q1200, Rigoberto Noel, MD, 40 mg at 09/10/18 1010 .  senna-docusate (Senokot-S) tablet 2 tablet, 2 tablet, Per Tube, BID, Brand Males, MD, 2 tablet at 09/09/18 2233 .  sodium chloride flush (NS) 0.9 % injection 10-40 mL, 10-40 mL, Intracatheter, Q12H, Mannam, Praveen, MD, 10 mL at 09/10/18 2107 .  sodium chloride flush (NS) 0.9 % injection 10-40 mL, 10-40 mL, Intracatheter, PRN, Mannam, Praveen, MD .  sodium chloride flush (NS) 0.9 % injection 10-40 mL, 10-40 mL, Intracatheter, Q12H, Ramaswamy, Murali, MD, 10 mL at 09/10/18 1011 .  sodium chloride flush (NS) 0.9 % injection  10-40 mL, 10-40 mL, Intracatheter, PRN, Brand Males, MD .  sodium chloride flush (NS) 0.9 % injection 5 mL, 5 mL, Intracatheter, Q8H, Wagner, Jaime, DO, 5 mL at 09/11/18 0604 .  thiamine (B-1) injection 100 mg, 100 mg, Intravenous, Daily, Minor, Grace Bushy, NP, 100 mg at 09/10/18 1010    Vital Signs: BP 112/88   Pulse (!) 113   Temp 98.1 F (36.7 C) (Oral)   Resp (!) 34   Ht 6' (1.829 m)   Wt 103.3 kg   SpO2  98%   BMI 30.89 kg/m   Physical Exam  NAD, intubated, sedated.  Eyes: scleral icterus Abdomen: Soft, non-distended.  Cholecystostomy in place.  Insertion site c/d/i.  Thin clear Bilious output in collection bag.   Imaging: Dg Abd 1 View  Result Date: 09/07/2018 CLINICAL DATA:  Enteric tube placement. Cooling blanket could not be removed for image. EXAM: ABDOMEN - 1 VIEW COMPARISON:  None. FINDINGS: Enteric tube courses through the stomach with tip in the midline likely over the distal stomach. Percutaneous pigtail drainage catheter over the right upper quadrant. Bowel gas pattern is nonobstructive. Visualized lung bases are within normal. IMPRESSION: Nonobstructive bowel gas pattern. Nasogastric tube with tip over the distal stomach in the midline abdomen. Electronically Signed   By: Marin Olp M.D.   On: 09/07/2018 23:52   Dg Chest Port 1 View  Result Date: 09/10/2018 CLINICAL DATA:  42 year old male with respiratory failure, intubated EXAM: PORTABLE CHEST 1 VIEW COMPARISON:  Prior chest x-ray yesterday, 09/09/2018 FINDINGS: The patient remains intubated. The tip of the endotracheal tube is 5.4 cm above the carina. Right subclavian central venous catheter has been removed. A right upper extremity PICC is present. The tip overlies the cavoatrial junction. Gastric tube also present. The tip lies below the field of view, presumably within the stomach. Stable cardiomegaly. Mediastinal contours remain normal. Relatively low inspiratory volumes with mild bibasilar atelectasis. No pulmonary edema, pneumothorax or large effusion. No focal airspace consolidation. IMPRESSION: Persistently low inspiratory volumes with mild bibasilar atelectasis. Stable cardiomegaly. Interval removal of right subclavian central venous catheter. Other support apparatus in stable and satisfactory position. Electronically Signed   By: Jacqulynn Cadet M.D.   On: 09/10/2018 10:50   Dg Chest Port 1 View  Result Date:  09/09/2018 CLINICAL DATA:  Acute respiratory failure. EXAM: PORTABLE CHEST 1 VIEW COMPARISON:  Radiograph of September 08, 2018. FINDINGS: Stable cardiomediastinal silhouette. Endotracheal and nasogastric tubes are in grossly good position. Right subclavian catheter and PICC line are unchanged in position. No acute pulmonary disease is noted. No pneumothorax or pleural effusion is noted. Bony thorax is unremarkable. IMPRESSION: Stable support apparatus.  No acute abnormality seen. Electronically Signed   By: Marijo Conception, M.D.   On: 09/09/2018 09:37   Dg Chest Port 1 View  Result Date: 09/08/2018 CLINICAL DATA:  42 year old male with respiratory failure. Subsequent encounter. EXAM: PORTABLE CHEST 1 VIEW COMPARISON:  09/06/2018 CT.  09/05/2018 chest x-ray. FINDINGS: Right central line tip distal superior vena cava. Right internal jugular introducer catheter unchanged in position with tip projecting just above superior vena cava formation. Endotracheal tube tip 4.8 cm above the carina. Nasogastric tube courses below the diaphragm. Tip is not included on the present exam. No pneumothorax. Interval improved aeration lung bases with apparent clearing of basilar consolidation. Heart size within normal limits. IMPRESSION: Interval improved aeration of lung bases with apparent clearing of previously noted basilar infiltrate/atelectasis. Subtle ground-glass opacity seen throughout remainder of lungs on  recent chest CT not appreciated on the present plain film exam and may also have cleared in the interim. Electronically Signed   By: Genia Del M.D.   On: 09/08/2018 08:49   [place] Ekg Site Rite  Result Date: 09/08/2018 If Site Rite image not attached, placement could not be confirmed due to current cardiac rhythm.  US Abdomen Limited Ruq  Result Date: 09/10/2018 CLINICAL DATA:  Jaundice; recent percutaneous cholecystostomy EXAM: ULTRASOUND ABDOMEN LIMITED RIGHT UPPER QUADRANT COMPARISON:  Abdominal ultrasound  of October 25th 2019 FINDINGS: Gallbladder: The gallbladder is nondistended and contains echogenic bile or sludge. A cholecystostomy drainage tube is present. Common bile duct: Diameter: 4.3 mm. No stones or sludge are observed within the gallbladder. Liver: There is free fluid surrounding the liver. The hepatic parenchyma exhibits normal echotexture. There is no intrahepatic ductal dilation. Portal vein is patent on color Doppler imaging with normal direction of blood flow towards the liver. IMPRESSION: No common bile duct or intrahepatic ductal dilation. No evidence of choledocholithiasis. Nondistended gallbladder with a cholecystostomy tube present. There is sludge within the gallbladder lumen. Normal appearing liver. Considerable free fluid within the abdomen similar to that seen on the CT scan of September 05, 2018. Electronically Signed   By: David  Martinique M.D.   On: 09/10/2018 12:22    Labs:  CBC: Recent Labs    09/07/18 0320 09/08/18 0335 09/10/18 0312 09/11/18 0404  WBC 21.6* 22.9* 28.0* 26.8*  HGB 14.9 14.7 16.3 15.9  HCT 47.9 48.2 51.2 48.6  PLT 91* 111* 161 232    COAGS: Recent Labs    08/21/2018 1851  09/08/18 0335 09/09/18 0501 09/10/18 0312 09/11/18 0404  INR 2.58   < > 1.64 1.55 1.62 1.62  APTT 70*  --   --   --   --   --    < > = values in this interval not displayed.    BMP: Recent Labs    09/08/18 2243 09/09/18 0501 09/10/18 0312 09/11/18 0404  NA 154* 155* 150* 151*  K 4.2 3.9 3.6 4.4  CL 121* 119* 113* 115*  CO2 29 32 27 30  GLUCOSE 124* 120* 165* 107*  BUN 66* 60* 61* 78*  CALCIUM 8.4* 8.3* 8.3* 8.2*  CREATININE 1.09 1.10 1.04 1.48*  GFRNONAA >60 >60 >60 58*  GFRAA >60 >60 >60 >60    LIVER FUNCTION TESTS: Recent Labs    09/08/18 0335 09/09/18 0800 09/10/18 0312 09/11/18 0404  BILITOT 25.1* 25.5* 26.6* 29.9*  AST 131* 130* 141* 153*  ALT 222* 191* 180* 167*  ALKPHOS 69 75 85 83  PROT 5.4* 5.6* 6.0* 6.0*  ALBUMIN 1.9* 1.9* 1.9* 1.8*     Assessment and Plan: Cholecystitis s/p percutaneous cholecystostomy tube placement by Dr. Earleen Newport 10/20 Remains intubated, sedated. T bili increasing (now 29.9).  GI following as well.  Continue current management.  IR to follow.   Electronically Signed: Ascencion Dike, PA-C 09/11/2018, 9:13 AM   I spent a total of 15 Minutes at the the patient's bedside AND on the patient's hospital floor or unit, greater than 50% of which was counseling/coordinating care for cholecystitis.

## 2018-09-11 NOTE — Progress Notes (Addendum)
Patient ID: Cody Patterson, male   DOB: [birth year], 42 y.o.   MRN: [id number]     Advanced Heart Failure Rounding Note  PCP-Cardiologist: No primary care provider on file.   Subjective:    Remains intubated. He has been diuresing with IV lasix. Creatinine trending up 1>1.5. Bilirubin continues to rise.  WBC 26.8 on Meropenum. Yesterday amio stopped with elevated LFTs . Remains in NSR   Blood CX 10/25 NGTD   Remains sedated and intubated.   MRI 10/25: MRI HEAD: 1. 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 2. Borderline parenchymal brain volume loss for age. 3. Minimal chronic small vessel ischemic changes.  MRA HEAD: 1. 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 Recommend CTA head and neck.  Objective:   Weight Range: 103.3 kg Body mass index is 30.89 kg/m.   Vital Signs:   Temp:  [96.8 F (36 C)-99.9 F (37.7 C)] 98.1 F (36.7 C) (10/31 0300) Pulse Rate:  [99-117] 115 (10/31 0700) Resp:  [0-35] 23 (10/31 0700) BP: (90-133)/(76-121) 125/105 (10/31 0700) SpO2:  [93 %-100 %] 97 % (10/31 0700) FiO2 (%):  [30 %] 30 % (10/31 0400) Weight:  [103.3 kg] 103.3 kg (10/31 0500) Last BM Date: 09/10/18  Weight change: Filed Weights   09/08/18 0500 09/09/18 0500 09/11/18 0500  Weight: 104.9 kg 106.4 kg 103.3 kg    Intake/Output:   Intake/Output Summary (Last 24 hours) at 09/11/2018 0721 Last data filed at 09/11/2018 0600 Gross per 24 hour  Intake 3727.12 ml  Output 2085 ml  Net 1642.12 ml      Physical Exam  CVP 9-10  General:  Jaundice, Intubated. Sweaty.  HEENT: ETT scleral  icterus Neck: supple. JVP ~10  Carotids 2+ bilat; no bruits. No lymphadenopathy or thryomegaly appreciated. Cor: PMI nondisplaced. Regular rate & rhythm. No rubs, gallops or murmurs. Lungs: clear Abdomen: soft, nontender, nondistended. No hepatosplenomegaly. No bruits or masses.  Good bowel sounds. Extremities: no cyanosis, clubbing, rash, R and LLE cool.Toes purple . RUE PICC  Neuro: intubated/sedated.    Telemetry   Sinus Tach 110s    EKG    No new tracings.  Labs    CBC Recent Labs    09/10/18 0312 09/11/18 0404  WBC 28.0* 26.8*  HGB 16.3 15.9  HCT 51.2 48.6  MCV 86.5 85.0  PLT 161 300   Basic Metabolic Panel Recent Labs    09/10/18 0312 09/11/18 0404  NA 150* 151*  K 3.6 4.4  CL 113* 115*  CO2 27 30  GLUCOSE 165* 107*  BUN 61* 78*  CREATININE 1.04 1.48*  CALCIUM 8.3* 8.2*  MG 2.6* 2.8*   Liver Function Tests Recent Labs    09/10/18 0312 09/11/18 0404  AST 141* 153*  ALT 180* 167*  ALKPHOS 85 83  BILITOT 26.6* 29.9*  PROT 6.0* 6.0*  ALBUMIN 1.9* 1.8*   No results for input(s): LIPASE, AMYLASE in the last 72 hours. Cardiac Enzymes No results for input(s): CKTOTAL, CKMB, CKMBINDEX, TROPONINI in the last 72 hours.  BNP: BNP (last 3 results) No results for input(s): BNP in the last 8760 hours.  ProBNP (last 3 results) No results for input(s): PROBNP in the last 8760 hours.   D-Dimer No results for input(s): DDIMER in the last 72 hours. Hemoglobin A1C No results for input(s): HGBA1C in the last 72 hours. Fasting Lipid Panel No results for input(s): CHOL, HDL, LDLCALC, TRIG, CHOLHDL, LDLDIRECT in  the last 72 hours. Thyroid Function Tests No results for input(s): TSH, T4TOTAL, T3FREE, THYROIDAB in the last 72 hours.  Invalid input(s): FREET3  Other results:   Imaging    US Abdomen Limited Ruq  Result Date: 09/10/2018 CLINICAL DATA:  Jaundice; recent percutaneous cholecystostomy EXAM: ULTRASOUND ABDOMEN LIMITED RIGHT UPPER QUADRANT COMPARISON:  Abdominal ultrasound of October 25th 2019 FINDINGS: Gallbladder: The gallbladder is nondistended and contains echogenic bile or sludge. A cholecystostomy drainage tube is present. Common bile duct: Diameter: 4.3 mm. No stones or sludge are observed within the gallbladder.  Liver: There is free fluid surrounding the liver. The hepatic parenchyma exhibits normal echotexture. There is no intrahepatic ductal dilation. Portal vein is patent on color Doppler imaging with normal direction of blood flow towards the liver. IMPRESSION: No common bile duct or intrahepatic ductal dilation. No evidence of choledocholithiasis. Nondistended gallbladder with a cholecystostomy tube present. There is sludge within the gallbladder lumen. Normal appearing liver. Considerable free fluid within the abdomen similar to that seen on the CT scan of September 05, 2018. Electronically Signed   By: David  Martinique M.D.   On: 09/10/2018 12:22     Medications:     Scheduled Medications: . chlorhexidine gluconate (MEDLINE KIT)  15 mL Mouth Rinse BID  . Chlorhexidine Gluconate Cloth  6 each Topical Daily  . clonazepam  0.5 mg Per Tube BID  . feeding supplement (PRO-STAT SUGAR FREE 64)  60 mL Per Tube TID  . feeding supplement (VITAL HIGH PROTEIN)  1,000 mL Per Tube Q24H  . folic acid  1 mg Intravenous Daily  . free water  300 mL Per Tube Q4H  . furosemide  40 mg Intravenous Daily  . heparin injection (subcutaneous)  5,000 Units Subcutaneous Q8H  . insulin aspart  0-9 Units Subcutaneous Q4H  . mouth rinse  15 mL Mouth Rinse 10 times per day  . multivitamin  15 mL Per Tube Daily  . pantoprazole sodium  40 mg Per Tube Q1200  . senna-docusate  2 tablet Per Tube BID  . sodium chloride flush  10-40 mL Intracatheter Q12H  . sodium chloride flush  10-40 mL Intracatheter Q12H  . sodium chloride flush  5 mL Intracatheter Q8H  . thiamine injection  100 mg Intravenous Daily    Infusions: . sodium chloride Stopped (08/20/2018 0614)  . sodium chloride 10 mL/hr at 09/11/18 0600  . meropenem (MERREM) IV 2 g (09/11/18 0603)    PRN Medications: fentaNYL (SUBLIMAZE) injection, fentaNYL (SUBLIMAZE) injection, metoprolol tartrate, midazolam, sodium chloride flush, sodium chloride flush    Patient Profile     Cody Patterson is a 42 y.o. 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   Presented to [facility] 10/16 with worsening dyspnea. Became hypotensive and with respiratory distress requiring pressor support and intubation. Transferred to [facility] for treatment and further evaluation.   Assessment/Plan   1. Shock: Mixed septic/cardiogenic shock. - H.flu in sputum. Repeat blood cultures NGTD. - Suspect primarily septic shock. WBC remains elevated  - Back on Meropenem.   - CT ab no evidence of abscess - Off norepi and milrinone.  -CO-OX 61%.   2. Acute on chronic systolic CHF:  Echocardiogram from [facility] 10/16: LV moderately dilated, severely impaired EF at 10%, moderate RV dysfunction, moderately elevated RVSP.  Nonischemic cardiomyopathy pre-existed this hospitalization (had cath at [facility]).  Suspect cardiomyopathy is due to substance abuse (amphetamines).   - Off milrinone and NE.  -  CO-OX 60%.  - Volume status elevated. Continue IV lasix. Now with worsening renal function will need to watch.  -Hold bb for now.  - Would hold off on adding hydralazine/imdur with recent stroke.  - Has massive 3rd spacing but holding diuretics with hypotension and severe hypernatremia - If family wants to proceed with aggressive care will need to start trying gentle diuresis soon  - He is not candidate for VAD or transplant with active substance abuse.  3. Acute respiratory failure: Remains intubated and sedated. Suspect PNA. P/CCM managing. Sputum + H influenzae.  - Family considering trach.  4. Acute CVA - Head CTshowed suspected recent acute to subacute infarct. Neuro consulted. - MRI 10/25 showed acute to early subacute multifocal small RIGHT MCA territory infarcts. MRA showed occluded right ICA with thready intracranial reconstitution; slow flow RIGHT MCA without emergent cerebral artery occlusion. Recommend CTA head and neck. - Appears to have dense left hemiparesis   -Per neuro and primary and goals of care conversation. 5. Polysubstance abuse: UDS + amphetamines and THC. No change.   6. ID: WBC 26.8   -Septic shock.  Has H influenzae in sputum, suspect PNA.  He also appears to have acute cholecystitis on abdominal US.  -Back on  Meropenem.   - S/p percutaneous cholecystostomy tube 10/20 (per CCM).  - abdominal CT without abscess or other acute issues 7. Acute liver failure: GI following.  Elevated LFTs.  Suspect ischemic hepatitis with shock + acute cholecystitis. Transaminases trending down but tbili rising again, likely in setting of acute cholecystitis. No change. - s/p perc drain.  - Bilirubin continues to rise.  - CT unremarkable  8. Hypernatremia - Sodium 151.   -continue free water boluses. 9. Atrial fibrillation and NSVT, paroxysmal:  - In Sinus Tach.  Will continue amio gtt. Dout this is causing cholestatic picture -INR 1.6   He is not on AC. (received vitamin K 10/19 and 10/20 for choley drain)  - Amio stopped 10/30 per Dr Elsworth Soho  Goals of care continue with family.   Length of Stay: [place], NP  09/11/2018, 7:21 AM  Advanced Heart Failure Team Pager [phone] (M-F; 7a - 4p)  Please contact [facility] for night-coverage after hours (4p -7a ) and weekends on [email or web address]  Agree with above.   Her remains critically ill with MSOF and appears to be deteriorating again with probable recurrent sepsis and worsening liver failure. Now less responsive. Co-ox 61% off all inotropes. Remains in NSR. Family wants to proceed with trach   Intubated/sedated Nonresponsive. Jaundiced JVP jaw Cor tachy regular Abs soft NT c-tube drain Ext 3+ edema  He remains critically ill and continues to deteriorate with MSOF. I have suggested comfort care as the only real option here but family wants to proceed with trach. Cardiac output currently adequate despite very low EF.   We will begin to follow at a distance.   CRITICAL CARE Performed by:  Glori Bickers  Total critical care time: 35 minutes  Critical care time was exclusive of separately billable procedures and treating other patients.  Critical care was necessary to treat or prevent imminent or life-threatening deterioration.  Critical care was time spent personally by me (independent of midlevel providers or residents) on the following activities: development of treatment plan with patient and/or surrogate as well as nursing, discussions with consultants, evaluation of patient's response to treatment, examination of patient, obtaining history from patient or surrogate, ordering and performing treatments and interventions, ordering and review of  laboratory studies, ordering and review of radiographic studies, pulse oximetry and re-evaluation of patient's condition.   Glori Bickers, MD  10:22 AM

## 2018-09-12 ENCOUNTER — Inpatient Hospital Stay (HOSPITAL_COMMUNITY): Payer: Medicaid Other

## 2018-09-12 DIAGNOSIS — K7201 Acute and subacute hepatic failure with coma: Secondary | ICD-10-CM

## 2018-09-12 LAB — CBC
HEMATOCRIT: 44.3 % (ref 39.0–52.0)
HEMOGLOBIN: 14.7 g/dL (ref 13.0–17.0)
MCH: 28.2 pg (ref 26.0–34.0)
MCHC: 33.2 g/dL (ref 30.0–36.0)
MCV: 85 fL (ref 80.0–100.0)
NRBC: 0.1 % (ref 0.0–0.2)
Platelets: 233 10*3/uL (ref 150–400)
RBC: 5.21 MIL/uL (ref 4.22–5.81)
RDW: 26.5 % — AB (ref 11.5–15.5)
WBC: 23.4 10*3/uL — ABNORMAL HIGH (ref 4.0–10.5)

## 2018-09-12 LAB — BPAM FFP
BLOOD PRODUCT EXPIRATION DATE: 201910312359
Blood Product Expiration Date: 201911052359
Blood Product Expiration Date: 201911052359
ISSUE DATE / TIME: 201910311557
UNIT TYPE AND RH: 8400
Unit Type and Rh: 7300
Unit Type and Rh: 7300

## 2018-09-12 LAB — EBV AB TO VIRAL CAPSID AG PNL, IGG+IGM: EBV VCA IGG: 358 U/mL — AB (ref 0.0–17.9)

## 2018-09-12 LAB — PREPARE FRESH FROZEN PLASMA
UNIT DIVISION: 0
Unit division: 0

## 2018-09-12 LAB — COMPREHENSIVE METABOLIC PANEL
ALK PHOS: 72 U/L (ref 38–126)
ALT: 184 U/L — ABNORMAL HIGH (ref 0–44)
ANION GAP: 9 (ref 5–15)
AST: 221 U/L — AB (ref 15–41)
Albumin: 1.6 g/dL — ABNORMAL LOW (ref 3.5–5.0)
BILIRUBIN TOTAL: 30.8 mg/dL — AB (ref 0.3–1.2)
BUN: 93 mg/dL — AB (ref 6–20)
CO2: 29 mmol/L (ref 22–32)
Calcium: 7.8 mg/dL — ABNORMAL LOW (ref 8.9–10.3)
Chloride: 112 mmol/L — ABNORMAL HIGH (ref 98–111)
Creatinine, Ser: 1.77 mg/dL — ABNORMAL HIGH (ref 0.61–1.24)
GFR calc Af Amer: 54 mL/min — ABNORMAL LOW (ref >60)
GFR calc non Af Amer: 46 mL/min — ABNORMAL LOW (ref >60)
GLUCOSE: 117 mg/dL — AB (ref 70–99)
POTASSIUM: 3.8 mmol/L (ref 3.5–5.1)
SODIUM: 150 mmol/L — AB (ref 135–145)
Total Protein: 5.7 g/dL — ABNORMAL LOW (ref 6.5–8.1)

## 2018-09-12 LAB — CREATININE, URINE, RANDOM: Creatinine, Urine: 58.09 mg/dL

## 2018-09-12 LAB — GLUCOSE, CAPILLARY
GLUCOSE-CAPILLARY: 104 mg/dL — AB (ref 70–99)
GLUCOSE-CAPILLARY: 90 mg/dL (ref 70–99)
Glucose-Capillary: 121 mg/dL — ABNORMAL HIGH (ref 70–99)
Glucose-Capillary: 134 mg/dL — ABNORMAL HIGH (ref 70–99)
Glucose-Capillary: 109 mg/dL — ABNORMAL HIGH (ref 70–99)
Glucose-Capillary: 132 mg/dL — ABNORMAL HIGH (ref 70–99)

## 2018-09-12 LAB — PROTIME-INR
INR: 1.49
PROTHROMBIN TIME: 17.9 s — AB (ref 11.4–15.2)

## 2018-09-12 LAB — COOXEMETRY PANEL
Carboxyhemoglobin: 1.2 % (ref 0.5–1.5)
Methemoglobin: 1 % (ref 0.0–1.5)
O2 SAT: 66.7 %
TOTAL HEMOGLOBIN: 15.1 g/dL (ref 12.0–16.0)

## 2018-09-12 LAB — IGG: IGG (IMMUNOGLOBIN G), SERUM: 1704 mg/dL — AB (ref 700–1600)

## 2018-09-12 LAB — SODIUM, URINE, RANDOM

## 2018-09-12 LAB — BILIRUBIN, TOTAL: Total Bilirubin: 33.3 mg/dL (ref 0.3–1.2)

## 2018-09-12 LAB — ANTI-SMOOTH MUSCLE ANTIBODY, IGG: F-ACTIN AB IGG: 63 U — AB (ref 0–19)

## 2018-09-12 LAB — PROCALCITONIN: PROCALCITONIN: 5.87 ng/mL

## 2018-09-12 LAB — MAGNESIUM: Magnesium: 3.1 mg/dL — ABNORMAL HIGH (ref 1.7–2.4)

## 2018-09-12 NOTE — Progress Notes (Signed)
Discussion with family regarding goals of care. Wife and sister are now in agreement that he would never want tracheostomy.  They are in agreement about proceeding to comfort care in the next 24 hours.  Brother was tearful and left the room.  They feel that he needs more time to come to terms. DNR will be issued.  Can progress to comfort care if family in agreement by tomorrow.   Comer Locket Vassie Loll MD

## 2018-09-12 NOTE — Progress Notes (Signed)
Palliative Medicine RN Note: Rec'd a call from RN Troyce requesting PMT meet with family today after family arrives.  Unfortunately, PMT staffing is severely limited due to [facility] and we do not have providers available for routine follow ups. I can place the patient on the schedule for a follow up tomorrow or Sunday (11/2 or 11/3).  Cody Patterson will update Dr Vassie Loll; I updated Dr Linna Darner w PMT.  Margret Chance Drea Jurewicz, RN, BSN, [facility] Palliative Medicine Team 09/12/2018 11:56 AM Office [phone]

## 2018-09-12 NOTE — Plan of Care (Signed)
  Problem: Education: Goal: Knowledge of disease or condition will improve Outcome: Not Progressing   Problem: Self-Care: Goal: Ability to participate in self-care as condition permits will improve Outcome: Not Progressing Goal: Verbalization of feelings and concerns over difficulty with self-care will improve Outcome: Not Progressing Goal: Ability to communicate needs accurately will improve Outcome: Not Progressing   Problem: Nutrition: Goal: Risk of aspiration will decrease Outcome: Not Progressing Goal: Dietary intake will improve Outcome: Not Progressing   Problem: Ischemic Stroke/TIA Tissue Perfusion: Goal: Complications of ischemic stroke/TIA will be minimized Outcome: Not Progressing

## 2018-09-12 NOTE — Progress Notes (Addendum)
  Per Dr Gala Romney   Continues to deteriorate. Comfort care seems to be the best option.   Heart failure team will sign off as of 09/12/18 given progressive multisystem organ failure.   Please call with questions.    Amy Clegg NP-C  2:55 PM  Agree with above. He has MSOF and continues to deteriorate. Comfort care seems to be only reasonable option.   We will sign off for now. Please call me with any questions.   Arvilla Meres, MD  4:02 PM

## 2018-09-12 NOTE — Progress Notes (Signed)
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 influenzae pneumonia. Course complicated by left hemiplegia due to acute right MCA CVA and acute liver failure initially attributed to shock liver but amiodarone stopped 10/30.  He has progressively become more encephalopathic last 2 days. Initial plan after intense family discussion was to place tracheostomy but wife decided against this yesterday and procedure was called off.  On exam-unresponsive to deep pain stimulus, roving eyes, no obvious seizure activity, deeply icteric, no JVD, 2+ anasarca , soft nontender abdomen, clear breath sounds bilateral, S1-S2 regular.  Chest x-ray personally reviewed from 11/1 which shows clearing of infiltrates.  Labs show sodium is holding at 150, bilirubin continues to climb at 30 and leukocytosis is decreasing procalcitonin slight high at 5.  Impression/plan  Acute metabolic encephalopathy-this may be due to progressive liver failure, await ammonia levels but has started lactulose Right MCA stroke-follow If remains unresponsive over the weekend, repeat head CT for cerebral edema  Acute liver failure-GI following, considering cholestasis of sepsis, resolving shock liver, normal ceruloplasmin level, medication induced possible, amiodarone has been stopped.  PERC drain continues to drain clear bile  Acute respiratory failure-tracheostomy on hold, spontaneous breathing trials on hold, awaiting further direction from family  Sepsis/Haemophilus influenza pneumonia-doubt he ever had cholecystitis, he was treated with meropenem for 11 days, hectic fevers may have been due to the lines which subsided once these were discontinued  Hypernatremia-continue D5W at 75/hour. AKI-progressive now, discontinue Lasix and obtain Merri Brunette is this hepatorenal?  Overall prognosis appears much poor than a few days ago due to progressive  liver failure and encephalopathy.  Will push for comfort care and further discussions with family.  Wife seems to be on board however girlfriend, sister and brother seem to want to push forward  The patient is critically ill with multiple organ systems failure and requires high complexity decision making for assessment and support, frequent evaluation and titration of therapies, application of advanced monitoring technologies and extensive interpretation of multiple databases. Critical Care Time devoted to patient care services described in this note independent of APP/resident  time is 35 minutes.   Cyril Mourning MD. Tonny Bollman. [facility] Pager [phone] If no response call [phone]   09/12/2018

## 2018-09-12 NOTE — Progress Notes (Signed)
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 Presented to [facility] with increasing dyspnea. Acute decompensation with hypotension and severe respiratory distress requiring intubation. Transferred to [facility] for management of sepsis, advanced heart failure.  Growing H influenza in sputum.  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  Past Medical History  Known cardiomyopathy and drug abuse (THC and methamphetamines)  Significant Hospital Events   10/16- Intubated at [facility] 10/17- Worsening shock, maxed on pressors, right heart cath indicative of distributive shock, high output failure 10/18-Started on milrinone, lasix for reduced cardiac output. PA numbers show worsening CO and high wedge 10/19- Started making urine, pressors weaning down, continues on milrinone. Surgery consulted for cholecystitis 10/20- Mucus plug overnight requiring bag, lavage. Went into afib.  Perc chole drain placed by IR 10/23 Start SBT trials, failed due to tachypnea 10/24 [facility] type respirations > CT head which showed stroke 10/26 -  spiking fevers 103 - swinging fevers since 09/03/18. Rising bilirubin. Rpt [place] c/w Acute choley - per IR yesterday - GB drain working well. CT chest/abd - GB drain ok. Stoool + . No abscess CCS suspecting this is cholestasis; not a candidate for surgery. Total Bili > IndirectBili. Remains on amio gtt, milrinone, Of levophed . MADE DNR by Palliative care 09/07/18 - fever + by peak of curve < 103F WBC down trend.  10/28 lines removed with  defervesce 10/30 stopped amio 11/1>> LFT's continue to climb  Consults: date of consult/date signed off & final recs:  10/17- Cardiology  10/19- Surgery signed on 09/02/2017 10/24 Neurology 10/26 - pallaitive  Procedures (surgical and bedside):  10/16 [facility] R subclavian CVC >> 10/28 10/16 [facility] L femoral arterial line >> out  10/17 PA catheter >> 10/28 10/20 Perc chole drain RUE PICC 10/28 >>  Significant Diagnostic Tests:  Echocardiogram 10/17- Mild to moderate LV dilation with EF 20%, diffuse hypokinesis.  Restrictive diastolic function. Mildly dilated RV with mildly decreased systolic function. No significant valvular abnormalities Abdominal ultrasound 08/29/2018- severe gallbladder thickening, sludge, cholelithiasis, small volume of perihepatic, pericholecystic and perinephric free fluid. UDS [facility] 10/16- Amphetamines, THC [place] 10/18 Initial numbers RA 10 PA 46/33 (41) PCWP 10 Thermo CO/CI  15.2/7.0 Fick 14.1/6.5 (co-ox 86%) SVR 231 CT head 10/24 > small area of hypoattenuation in right parietal lobe suspicious for recent acute to subacute infarct. 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  Occluded R ICA, slow flow R MCA.  CT chest / abd 10/24 >> mild GGO, bibasal consolidation, perc drain well positioned , no other abd cause of fever  Micro Data:  Bcx 10/23, 10/25 >> ng Bcx 10/17 >> ng Trach aspirate 10/17- H influenza MRSA PR 10/16 - positive Blood 10/22 - neg as of 10/27 Trach aspirate 10/25 - moderate yeast Blood 10/25>> No growth   Antimicrobials:  Vancomycin 10/17 > 10/21. 10/24 > 10/29 Meropenen 10/17 > 10/31 Aztreonam 10/17>> off  SUBJECTIVE/OVERNIGHT/INTERVAL HX    He remains critically ill, less responsive last 3 days , no sedation. He is jaundice with continued climb in  LFT's and creatinine No weaning 09/12/2018, attempted but RR up to 40's T max 100.7,  Leukocytosis    Objective   Blood pressure 93/78, pulse (!) 106, temperature 98 F (36.7 C), temperature source Oral, resp. rate (!) 26, height 6' (1.829 m), weight 104.6 kg, SpO2 100 %.    Vent Mode: PRVC FiO2 (%):  [30 %] 30 % Set Rate:  [14 bmp] 14 bmp Vt Set:  [540 mL] 540 mL PEEP:  [5 cmH20] 5 cmH20 Plateau Pressure:  [18 cmH20-22 cmH20] 18 cmH20   Intake/Output Summary (Last 24 hours) at 09/12/2018 0849 Last data filed at 09/12/2018 0800 Gross per 24 hour  Intake 1575 ml  Output 1305 ml  Net 270 ml   Filed Weights   09/09/18 0500 09/11/18 0500 09/12/18 0500  Weight: 106.4 kg 103.3 kg 104.6 kg   Physical Exam:  Acutely ill young man, obviously jaundiced, diaphoretic, unresponsive off sedation ETT, OGT, Jaundiced, trace JVD, 2+ edema Bilateral chest excursion,Decreased breath sounds , RR 28,  spontaneous breathing noted. S1-S2 regular, no murmur, rub or gallop Soft nontender abdomen, distended, BS diminished, TF at goal, hepatic drain with bilious drainage Off sedation, does not follow commands, , pupils 3 mm bilaterally equally reactive to light. Multiple tattoos, otherwise dry and intact, no rash, lesions  Chest x-ray from 10/30 personally reviewed which shows clearing of infiltrates and bibasilar atelectasis    Assessment & Plan:   #Shock - Cardiogenic (AoC sCHF with cardiomyopathy and EF 10%), septic combination.  Severe cardiomyopathy, noncompliant. V tach, A. Fib (resolved with amiodarone). ST with PVC's   PLAN - per cards;off milrionone Gt - off amio since 10/30 , will use rate control if RVR -Not a candidate for LVAD due to substance abuse hx - Continue Lopressor prn for rate control .  - Trend lactate prn    #Acute respiratory failure secondary to pulmonary edema and H. influenzae pneumonia. -Weaning limited by agitation and tachypnea >> Failed wean 11/1 -Discussed tracheostomy with family , will give them the weekend to decide if this is what they  want   #Severe sepsis-H. influenzae pneumonia, +/ Acute cholecystitis. Procalcitonin 5.87. Lines discontinued  PLAN Continue Merrin Trend PCT to guide antimicrobial tx Trend Cultures Re-culture as is clinically indicated Trend fever and WBC curve    #Acute metabolic encephalopathy-related to sedation + ICU delirium + new stroke + liver failure  PLAN fent int, but minimize RASS goal 0  Dc clonazepam since he has been less responsive last 3 days,  Precedex  Not effective Trend ammonia level  #Right MCA stroke. - Neurology following. -  Will need  aggressive rehab when liberated from vent  #AKI - improved but rising again 11/1 Creatinine 1.77 -  Continue to follow urine output and creatinine. - Diuresis as able per cards - Avoid renal toxic medications  - Maintain renal perfusion with MAP goal > 65  #Hypernatremia. - free water increased to 300 every 4 - Continue  D5W @ 50/h  #Acute liver failure /Elevated LFTs- s/p perc drain 10/20 doubt Acute cholecystitis  Etiology still unclear, GI input appreciated, seems to be direct bilirubinemia  trend atypical for  shock liver , drain seems to be working well Have stopped amio, allowing wash out    PLAN - Continue drain x 4 - 6 weeks then can determine if surgery will be necessary. -Trend  LFT -chk ammonia - Trend Bilirubin  - consider US liver/ abdomen      Disposition / Summary  of Today's Plan 09/12/18   As above  Best Practice    Code Status: DNR revoked by wife Family Communication: No family at bedside 11/1  ATTESTATION & SIGNATURE   Bevelyn Ngo, AGACNP-BC [facility] Pulmonary/Critical Care Medicine Pager # [phone]   09/12/2018 9:36 AM

## 2018-09-12 NOTE — Progress Notes (Signed)
Daily Rounding Note  09/12/2018, 11:06 AM  LOS: 16 days   SUBJECTIVE:   Chief complaint: Jaundice     Remains sedated, intubated on vent.   Tolerating tube feeds.    OBJECTIVE:         Vital signs in last 24 hours:    Temp:  [97.4 F (36.3 C)-99.2 F (37.3 C)] 98 F (36.7 C) (11/01 0802) Pulse Rate:  [104-113] 111 (11/01 0900) Resp:  [24-55] 28 (11/01 0900) BP: (87-121)/(21-106) 99/83 (11/01 0900) SpO2:  [96 %-100 %] 100 % (11/01 0900) FiO2 (%):  [30 %] 30 % (11/01 0800) Weight:  [104.6 kg] 104.6 kg (11/01 0500) Last BM Date: 09/12/18 Filed Weights   09/09/18 0500 09/11/18 0500 09/12/18 0500  Weight: 106.4 kg 103.3 kg 104.6 kg   General: sedated on vent   Heart: RRR Chest: regular, non-labored breathing on vent Abdomen: distended, tight, NT.  BS hypoactive. Multiple sub centimeter bruises at injection sites in lower abdomen.   Extremities: anasarca with swelling og all 4 limbs and trunk Neuro/Psych:  Unresponsive on vent.    Intake/Output from previous day: 10/31 0701 - 11/01 0700 In: 1915 [I.V.:720; NG/GT:1195] Out: 1480 [Urine:1450; Drains:30]  Intake/Output this shift: Total I/O In: 5 [Other:5] Out: -   Lab Results: Recent Labs    09/10/18 0312 09/11/18 0404 09/12/18 0427  WBC 28.0* 26.8* 23.4*  HGB 16.3 15.9 14.7  HCT 51.2 48.6 44.3  PLT 161 232 233   BMET Recent Labs    09/10/18 0312 09/11/18 0404 09/12/18 0427  NA 150* 151* 150*  K 3.6 4.4 3.8  CL 113* 115* 112*  CO2 27 30 29   GLUCOSE 165* 107* 117*  BUN 61* 78* 93*  CREATININE 1.04 1.48* 1.77*  CALCIUM 8.3* 8.2* 7.8*   LFT Recent Labs    09/10/18 0312 09/11/18 0404 09/12/18 0427  PROT 6.0* 6.0* 5.7*  ALBUMIN 1.9* 1.8* 1.6*  AST 141* 153* 221*  ALT 180* 167* 184*  ALKPHOS 85 83 72  BILITOT 26.6* 29.9* 30.8*  BILIDIR 18.0* 18.4*  --   IBILI 8.6* 11.5*  --    PT/INR Recent Labs    09/11/18 0404 09/12/18 0427    LABPROT 19.1* 17.9*  INR 1.62 1.49   Hepatitis Panel No results for input(s): HEPBSAG, HCVAB, HEPAIGM, HEPBIGM in the last 72 hours.  Studies/Results: Dg Chest Port 1 View  Result Date: 09/12/2018 CLINICAL DATA:  ET tube, respiratory failure EXAM: PORTABLE CHEST 1 VIEW COMPARISON:  09/11/2018 FINDINGS: Support devices are stable. Heart is normal size. No confluent airspace opacities or effusions. No acute bony abnormality. IMPRESSION: Support devices stable. No acute cardiopulmonary disease. Electronically Signed   By: Charlett Nose M.D.   On: 09/12/2018 08:59   Dg Chest Port 1 View  Result Date: 09/11/2018 CLINICAL DATA:  42 year old male with bacteremia, sepsis, respiratory failure, right ICA occlusion with small right MCA infarcts. EXAM: PORTABLE CHEST 1 VIEW COMPARISON:  09/10/2018 and earlier. FINDINGS: Portable AP semi upright view at 1218 hours. Endotracheal tube tip in good position between the level the clavicles and carina. Stable right PICC line. Enteric tube courses to the left abdomen, tip not included. Larger lung volumes. Allowing for portable technique the lungs are clear. Normal cardiac size and mediastinal contours. No pneumothorax or pleural effusion. No acute osseous abnormality identified. IMPRESSION: 1. Satisfactory ET tube position.  Otherwise stable lines and tubes. 2. No acute cardiopulmonary abnormality. Electronically Signed  By: Odessa Fleming M.D.   On: 09/11/2018 12:35   US Abdomen Limited Ruq  Result Date: 09/10/2018 CLINICAL DATA:  Jaundice; recent percutaneous cholecystostomy EXAM: ULTRASOUND ABDOMEN LIMITED RIGHT UPPER QUADRANT COMPARISON:  Abdominal ultrasound of October 25th 2019 FINDINGS: Gallbladder: The gallbladder is nondistended and contains echogenic bile or sludge. A cholecystostomy drainage tube is present. Common bile duct: Diameter: 4.3 mm. No stones or sludge are observed within the gallbladder. Liver: There is free fluid surrounding the liver. The hepatic  parenchyma exhibits normal echotexture. There is no intrahepatic ductal dilation. Portal vein is patent on color Doppler imaging with normal direction of blood flow towards the liver. IMPRESSION: No common bile duct or intrahepatic ductal dilation. No evidence of choledocholithiasis. Nondistended gallbladder with a cholecystostomy tube present. There is sludge within the gallbladder lumen. Normal appearing liver. Considerable free fluid within the abdomen similar to that seen on the CT scan of September 05, 2018. Electronically Signed   By: David  [place] M.D.   On: 09/10/2018 12:22    ASSESMENT:   *   Elevated LFTs, jaundice S/p cholecystostomy tube 10/20 for possible cholecystitis (in retrospect cholecystitis unlikely). Treated with Meropenem x 11 d.   Amiodarone discontinued Most likely dx is cholestasis of sepsis in the setting of resolving ischemic injury/shock liver. Ruled out for AIH, viral hepatitis. Opthamology exam negative for KF rings (incidental OU cataract) 24 hour urine for copper in progress to r/o Wilson's disease.    *  Coagulopathy, improved.  No FFP or Vitamin K.    *   Cardiogenic shock.  Severe CM due to amphetamine abuse. EF 20%.   H flu PNA.    *    Multifocal strokes.   Left hemiplegia due to acute right MCA infarct.     *   AKI, rising BUN/creat.    *   Poor prognosis   PLAN   *   Await urinary copper collection, though with no KF rings on eye exam, normal Ceruloplasmin, doubt WIson's dz .      Cody Patterson  09/12/2018, 11:06 AM Phone [phone]

## 2018-09-12 DEATH — deceased

## 2018-09-13 DIAGNOSIS — I63411 Cerebral infarction due to embolism of right middle cerebral artery: Secondary | ICD-10-CM

## 2018-09-13 DIAGNOSIS — K810 Acute cholecystitis: Secondary | ICD-10-CM

## 2018-09-13 DIAGNOSIS — I509 Heart failure, unspecified: Secondary | ICD-10-CM

## 2018-09-13 LAB — COMPREHENSIVE METABOLIC PANEL
ALK PHOS: 64 U/L (ref 38–126)
ALT: 222 U/L — AB (ref 0–44)
AST: 300 U/L — AB (ref 15–41)
Albumin: 1.7 g/dL — ABNORMAL LOW (ref 3.5–5.0)
Anion gap: 9 (ref 5–15)
BUN: 107 mg/dL — AB (ref 6–20)
CALCIUM: 7.6 mg/dL — AB (ref 8.9–10.3)
CHLORIDE: 113 mmol/L — AB (ref 98–111)
CO2: 27 mmol/L (ref 22–32)
CREATININE: 2.07 mg/dL — AB (ref 0.61–1.24)
GFR calc Af Amer: 44 mL/min — ABNORMAL LOW (ref >60)
GFR, EST NON AFRICAN AMERICAN: 38 mL/min — AB (ref >60)
Glucose, Bld: 118 mg/dL — ABNORMAL HIGH (ref 70–99)
Potassium: 3.4 mmol/L — ABNORMAL LOW (ref 3.5–5.1)
Sodium: 149 mmol/L — ABNORMAL HIGH (ref 135–145)
Total Bilirubin: 35.6 mg/dL (ref 0.3–1.2)
Total Protein: 6.1 g/dL — ABNORMAL LOW (ref 6.5–8.1)

## 2018-09-13 LAB — CBC
HEMATOCRIT: 43.3 % (ref 39.0–52.0)
Hemoglobin: 14.5 g/dL (ref 13.0–17.0)
MCH: 28.6 pg (ref 26.0–34.0)
MCHC: 33.5 g/dL (ref 30.0–36.0)
MCV: 85.4 fL (ref 80.0–100.0)
PLATELETS: 223 10*3/uL (ref 150–400)
RBC: 5.07 MIL/uL (ref 4.22–5.81)
RDW: 27.6 % — AB (ref 11.5–15.5)
WBC: 22.3 10*3/uL — ABNORMAL HIGH (ref 4.0–10.5)
nRBC: 0.2 % (ref 0.0–0.2)

## 2018-09-13 LAB — COOXEMETRY PANEL
Carboxyhemoglobin: 0.9 % (ref 0.5–1.5)
Methemoglobin: 1 % (ref 0.0–1.5)
O2 Saturation: 56.4 %
TOTAL HEMOGLOBIN: 13.3 g/dL (ref 12.0–16.0)

## 2018-09-13 LAB — GLUCOSE, CAPILLARY
Glucose-Capillary: 111 mg/dL — ABNORMAL HIGH (ref 70–99)
Glucose-Capillary: 120 mg/dL — ABNORMAL HIGH (ref 70–99)
Glucose-Capillary: 127 mg/dL — ABNORMAL HIGH (ref 70–99)
Glucose-Capillary: 92 mg/dL (ref 70–99)

## 2018-09-13 LAB — PROTIME-INR
INR: 1.75
PROTHROMBIN TIME: 20.2 s — AB (ref 11.4–15.2)

## 2018-09-13 LAB — BILIRUBIN, DIRECT: Bilirubin, Direct: 24 mg/dL — ABNORMAL HIGH (ref 0.0–0.2)

## 2018-09-13 LAB — PROCALCITONIN: PROCALCITONIN: 7.06 ng/mL

## 2018-09-13 LAB — AMMONIA: AMMONIA: 176 umol/L — AB (ref 9–35)

## 2018-09-13 LAB — MAGNESIUM: Magnesium: 3.1 mg/dL — ABNORMAL HIGH (ref 1.7–2.4)

## 2018-09-13 MED ORDER — GLYCOPYRROLATE 0.2 MG/ML IJ SOLN
0.2000 mg | INTRAMUSCULAR | Status: DC | PRN
  Administered 2018-09-14: 0.2 mg via INTRAVENOUS
  Filled 2018-09-13: qty 1

## 2018-09-13 MED ORDER — MORPHINE 100MG IN NS 100ML (1MG/ML) PREMIX INFUSION
10.0000 mg/h | INTRAVENOUS | Status: DC
  Administered 2018-09-13: 2 mg/h via INTRAVENOUS
  Administered 2018-09-14 (×2): 11 mg/h via INTRAVENOUS
  Administered 2018-09-14 (×2): 10 mg/h via INTRAVENOUS
  Filled 2018-09-13 (×4): qty 100

## 2018-09-13 MED ORDER — MIDAZOLAM HCL 2 MG/2ML IJ SOLN
2.0000 mg | INTRAMUSCULAR | Status: DC | PRN
  Administered 2018-09-13: 2 mg via INTRAVENOUS
  Filled 2018-09-13: qty 2

## 2018-09-13 MED ORDER — MORPHINE BOLUS VIA INFUSION
1.0000 mg | INTRAVENOUS | Status: DC | PRN
  Administered 2018-09-13: 4 mg via INTRAVENOUS
  Administered 2018-09-13: 1 mg via INTRAVENOUS
  Administered 2018-09-13: 4 mg via INTRAVENOUS
  Administered 2018-09-13: 1 mg via INTRAVENOUS
  Administered 2018-09-14: 4 mg via INTRAVENOUS
  Administered 2018-09-14: 2 mg via INTRAVENOUS
  Filled 2018-09-13: qty 4

## 2018-09-13 NOTE — Progress Notes (Signed)
Patient extubated by Leotis Shames, RT with this RN at bedside.  ETT, OGT, and esophageal tubes removed.  Patient RR 38.  Increased Morphine gtt to 4mg  and given 1mg  IV bolus prior to extubation.

## 2018-09-13 NOTE — Progress Notes (Addendum)
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 Presented to [facility] with increasing dyspnea. Acute decompensation with hypotension and severe respiratory distress requiring intubation. Transferred to [facility] for management of sepsis, advanced heart failure.  Growing H influenza in sputum.  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  Past Medical History  Known cardiomyopathy and drug abuse (THC and methamphetamines)  Significant Hospital Events   10/16- Intubated at [facility] 10/17- Worsening shock, maxed on pressors, right heart cath indicative of distributive shock, high output failure 10/18-Started on milrinone, lasix for reduced cardiac output. PA numbers show worsening CO and high wedge 10/19- Started making urine, pressors weaning down, continues on milrinone. Surgery consulted for cholecystitis 10/20- Mucus plug overnight requiring bag, lavage. Went into afib.  Perc chole drain placed by IR 10/23 Start SBT trials, failed due to tachypnea 10/24 [facility] type respirations > CT head which showed stroke 10/26 -  spiking fevers 103 - swinging fevers since 09/03/18. Rising bilirubin. Rpt [place] c/w Acute choley - per IR yesterday - GB drain working well. CT chest/abd - GB drain ok. Stoool + . No abscess CCS suspecting this is cholestasis; not a candidate for surgery. Total Bili > IndirectBili. Remains on amio gtt, milrinone, Of levophed . MADE DNR by Palliative care 09/07/18 - fever + by peak of curve < 103F WBC down trend.  10/28 lines removed with  defervesce 10/30 stopped amio 11/1>> LFT's continue to climb 11/2>> LFT's continue to climb, Ammonia 176, palliative care to conference with family today  Consults: date of consult/date signed off & final recs:  10/17- Cardiology  10/19- Surgery signed on 09/02/2017 10/24 Neurology 10/26 - pallaitive  Procedures (surgical and bedside):  10/16 [facility] R subclavian CVC >> 10/28 10/16 [facility] L femoral arterial line >> out  10/17 PA catheter >> 10/28 10/20 Perc chole drain RUE PICC 10/28 >>  Significant Diagnostic Tests:  Echocardiogram 10/17- Mild to moderate LV dilation with EF 20%, diffuse hypokinesis.  Restrictive diastolic function. Mildly dilated RV with mildly decreased systolic function. No significant valvular abnormalities Abdominal ultrasound 08/29/2018- severe gallbladder thickening, sludge, cholelithiasis, small volume of perihepatic, pericholecystic and perinephric free fluid. UDS [facility] 10/16- Amphetamines, THC [place] 10/18 Initial numbers RA 10 PA 46/33 (41) PCWP 10 Thermo CO/CI  15.2/7.0 Fick 14.1/6.5 (co-ox 86%) SVR 231 CT head 10/24 > small area of hypoattenuation in right parietal lobe suspicious for recent acute to subacute infarct. 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  Occluded R ICA, slow flow R MCA.  CT chest / abd 10/24 >> mild GGO, bibasal consolidation, perc drain well positioned , no other abd cause of fever  Micro Data:  Bcx 10/23, 10/25 >> ng Bcx 10/17 >> ng Trach aspirate 10/17- H influenza MRSA PR 10/16 - positive Blood 10/22 - neg as of 10/27 Trach aspirate 10/25 - moderate yeast Blood 10/25>> No growth   Antimicrobials:  Vancomycin 10/17 > 10/21. 10/24 > 10/29 Meropenen 10/17 > 10/31 Aztreonam 10/17>> off  SUBJECTIVE/OVERNIGHT/INTERVAL HX    He remains critically ill, less  responsive last 3 days , no sedation. He is jaundice with continued climb  in LFT's and creatinine, ammonia No weaning 09/12/2018, attempted but RR up to 40's T max 102.9, Leukocytosis    Objective   Blood pressure (!) 157/97, pulse (!) 102, temperature 100.2 F (37.9 C), resp. rate (!) 23, height 6' (1.829 m), weight 107.5 kg, SpO2 98 %.    Vent Mode: PRVC FiO2 (%):  [30 %-50 %] 40 % Set Rate:  [14 bmp] 14 bmp Vt Set:  [540 mL] 540 mL PEEP:  [5 cmH20] 5 cmH20 Plateau Pressure:  [17 cmH20-30 cmH20] 23 cmH20   Intake/Output Summary (Last 24 hours) at 09/13/2018 1052 Last data filed at 09/13/2018 0500 Gross per 24 hour  Intake 1979.29 ml  Output 1518 ml  Net 461.29 ml   Filed Weights   09/11/18 0500 09/12/18 0500 09/13/18 0251  Weight: 103.3 kg 104.6 kg 107.5 kg   Physical Exam:  Acutely ill young man,  jaundiced, diaphoretic, unresponsive off sedation NCAT ETT, OGT, Jaundiced, trace JVD, 2+ edema Bilateral chest excursion, Decreased breath sounds , RR 23,  spontaneous breathing noted. S1-S2 regular, no murmur, rub or gallop Soft nontender abdomen, distended, BS diminished, TF at goal, hepatic drain with bilious drainage Off sedation, does not follow commands,  pupils 3 mm bilaterally equally reactive to light. Multiple tattoos, otherwise dry and intact, no rash, lesions  Chest x-ray from 11/1 personally reviewed shows no acute cardiopulmonary process   Assessment & Plan:   #Shock - Cardiogenic (AoC sCHF with cardiomyopathy and EF 10%), septic combination.  Severe cardiomyopathy, noncompliant. V tach, A. Fib (resolved with amiodarone). ST with PVC's   PLAN - per cards;off milrionone Gt - off amio since 10/30 , will use rate control if RVR -Not a candidate for LVAD due to substance abuse hx - Continue Lopressor prn for rate control .  - Trend lactate prn    #Acute respiratory failure secondary to pulmonary edema and H. influenzae pneumonia. -Weaning limited by agitation and tachypnea >> Failed wean 11/2 - Family have decided against  trach  - Considering terminal wean>> He is a full DNR - No further weaning until family have decided on comfort care   #Severe sepsis-H. influenzae pneumonia, +/ Acute cholecystitis. Procalcitonin 7.06. Lines discontinued Most likely dx is cholestasis of sepsis in the setting of resolving ischemic injury/shock liver.   PLAN Merrin discontinued by Vassie Loll 11/1 Trend PCT to guide antimicrobial tx if family do not follow through with terminal wean Trend Cultures  Re-culture as is clinically indicated Trend fever and WBC curve    #Acute metabolic encephalopathy-related to sedation + ICU delirium + new stroke + liver failure  PLAN No sedation Dc clonazepam since he has been less responsive last 3 days,  Precedex  Not effective Trend ammonia level  #Right MCA stroke. - Neurology following. -  Will need  aggressive rehab when liberated from vent  #AKI - improved but rising again 11/1 Creatinine 2.07 - Continue to follow urine output and creatinine. - Diuresis as able per cards - Avoid renal toxic medications  - Maintain renal perfusion with MAP goal > 65  #Hypernatremia. - free water increased to 300 every 4 - Continue  D5W @ 50/h  #Acute liver failure /Elevated LFTs- s/p perc drain 10/20 doubt Acute cholecystitis  Etiology still unclear, GI input appreciated, seems to be direct bilirubinemia  trend atypical for  shock liver , drain seems to be working well No KF rings on eye  exam, normal Ceruloplasmin, >> Doubt Wilson's disease Have stopped amio, allowing wash out  Most likely dx is cholestasis of sepsis in the setting of resolving ischemic injury/shock liver. Ruled out for AIH, viral hepatitis. LFT's continue to increase  PLAN - Continue drain x 4 - 6 weeks then can determine if surgery will be necessary. -Trend  LFT - trend ammonia - Trend Bilirubin  - consider US liver/ abdomen      Disposition / Summary of Today's Plan 09/13/18   Family have decided against  trach. Pt is a DNR as of 11/1 Palliation to conference with family today re: withdrawal  Best Practice    Code Status: DNR per wife Family Communication: No family at bedside 11/2  Plan is  for Palliation Conference today to decide upon comfort care and terminal extubation  ATTESTATION & SIGNATURE   Bevelyn Ngo, AGACNP-BC [facility] [place] Pulmonary/Critical Care Medicine Pager # [phone]   09/13/2018 10:52 AM  Attending Note:  42 year old male with history of advanced systolic heart failure and non-compliance presenting to PCCM with cardiogenic shock, renal failure, liver failure, respiratory failure and heart failure.  On exam, he is jaundiced with coarse BS diffusely.  I reviewed CXR myself, ETT is in a good position and edema noted.  Discussed with PCCM-NP and bedside RN.  Patient is deteriorating rapidly.  He is DNR at this point and not a candidate for much aggressive interventions.  Wife is here but not brother or girlfriend.  Palliative care is scheduled to meet with the family today for proceeding with comfort care.  Will allow palliative to lead the process.  The patient is critically ill with multiple organ systems failure and requires high complexity decision making for assessment and support, frequent evaluation and titration of therapies, application of advanced monitoring technologies and extensive interpretation of multiple databases.   Critical Care Time devoted to patient care services described in this note is  34  Minutes. This time reflects time of care of this signee Dr Koren Bound. This critical care time does not reflect procedure time, or teaching time or supervisory time of PA/NP/Med student/Med Resident etc but could involve care discussion time.  Alyson Reedy, M.D. [facility] Pulmonary/Critical Care Medicine. Pager: [phone]. After hours pager: [phone].

## 2018-09-13 NOTE — Progress Notes (Signed)
Morphine gtt started as ordered.  Patient's girlfriend Janeal Holmes and spouse Leia Alf at bedside.  They had advised Lauren, RT that they were ready to extubate the patient.  Once this nurse and Lauren, RT at bedside, they wanted to wait until 1700-1800.  Advised would be best to perform extubation prior to shift change.  Verbalized understanding.  Tube feeds discontinued, SCD's removed, and cooling blanket turned off.

## 2018-09-13 NOTE — Progress Notes (Signed)
   09/13/18 2200  Clinical Encounter Type  Visited With Health care provider  Visit Type Initial;Other (Comment) (move to comfort care)  Referral From Other (Comment) (NP)   Responded to Epic consult b/c pt had been moved to comfort care.  Per care RN, no family currently present and pt is not conscious.  Pls page chaplain when loved ones are present for spiritual care and support.  Margretta Sidle resident, [phone]

## 2018-09-13 NOTE — Progress Notes (Signed)
Daily Progress Note   Patient Name: Cody Patterson       Date: 09/13/2018 DOB: 10-15-1976  Age: 42 y.o. MRN#: [id number] Attending Physician: Rigoberto Noel, MD Primary Care Physician: Patient, No Pcp Per Admit Date: 08/13/2018   Reason for Consultation/Follow-up: Establishing goals of care and Terminal Care  Subjective: Patient is responsive to painful stimuli, otherwise unresponsive. Remains intubated. No s/s of distress.  GOC:  Follow-up with wife, Lemmie Evens in conference room. Lemmie Evens shares her conversation with Dr. Elsworth Soho yesterday. Again discussed course of hospitalization including diagnoses and interventions. She understands worsening clinical condition and very poor prognosis. She speaks of not being able to sign consent for trach knowing [name] wouldn't want to live like this. She confirms her understanding of recommendation for comfort and speaks of wanting "comfort" and "respect" for him.   Discussed process of compassionate extubation to comfort measures only. Educated on initiating continuous Morphine infusion prior to extubation to ensure comfort. Explained other medications that will be used to ensure comfort and dignity at EOL. Explained that interventions not aimed at comfort will be discontinued. Also explained comfort screen. Educated on EOL expectations. Prepared Marla of prognosis of hours-days once ventilator is discontinued. Lemmie Evens confirms understands and that she is waiting for Andreas Newport before extubating. Lemmie Evens shares that although the family/gf are very saddened by Bubba's condition, they respect and understand her decision for shift to comfort. Encouraged chaplain visits.   Therapeutic listening as Lemmie Evens shared stories. She has notified all family/friends that would want to visit.  Emotional/spiritual support provided.   Length of Stay: 17  Current Medications: Scheduled Meds:  . chlorhexidine gluconate (MEDLINE KIT)  15 mL Mouth Rinse BID  . Chlorhexidine Gluconate Cloth  6 each Topical Daily  . mouth rinse  15 mL Mouth Rinse 10 times per day  . sodium chloride flush  10-40 mL Intracatheter Q12H  . sodium chloride flush  5 mL Intracatheter Q8H    Continuous Infusions: . sodium chloride Stopped (09/02/2018 6226)  . sodium chloride 10 mL/hr at 09/11/18 0700  . morphine      PRN Meds: glycopyrrolate, metoprolol tartrate, midazolam, morphine, sodium chloride flush  Physical Exam  Constitutional: He appears ill. He is intubated.  HENT:  Head: Normocephalic and atraumatic.  Cardiovascular: Regular rhythm.  Pulmonary/Chest: Breath sounds normal. No accessory  muscle usage. No tachypnea. He is intubated. No respiratory distress.  Abdominal: Bowel sounds are normal. He exhibits no distension. There is no tenderness.  Perc drain  Neurological: He is unresponsive.  Only responsive to painful stimuli.   Skin: Skin is warm and dry.  jaundice  Nursing note and vitals reviewed.          Vital Signs: BP 96/73   Pulse 90   Temp 98.2 F (36.8 C)   Resp (!) 30   Ht 6' (1.829 m)   Wt 107.5 kg   SpO2 98%   BMI 32.14 kg/m  SpO2: SpO2: 98 % O2 Device: O2 Device: Ventilator O2 Flow Rate:    Intake/output summary:   Intake/Output Summary (Last 24 hours) at 09/13/2018 1418 Last data filed at 09/13/2018 1300 Gross per 24 hour  Intake 2199.21 ml  Output 1953 ml  Net 246.21 ml   LBM: Last BM Date: 09/12/18 Baseline Weight: Weight: 92 kg Most recent weight: Weight: 107.5 kg       Palliative Assessment/Data: PPS 10%   Flowsheet Rows     Most Recent Value  Intake Tab  Referral Department  Critical care  Unit at Time of Referral  ICU  Palliative Care Primary Diagnosis  Cardiac  Date Notified  09/05/18  Palliative Care Type  New Palliative care  Reason for  referral  Clarify Goals of Care  Date of Admission  08/18/2018  Date first seen by Palliative Care  09/06/18  # of days Palliative referral response time  1 Day(s)  # of days IP prior to Palliative referral  9  Clinical Assessment  Palliative Performance Scale Score  10%  Psychosocial & Spiritual Assessment  Palliative Care Outcomes  Patient/Family meeting held?  Yes  Who was at the meeting?  wife  Palliative Care Outcomes  Clarified goals of care, Provided end of life care assistance, Improved pain interventions, Improved non-pain symptom therapy, Changed to focus on comfort, Provided psychosocial or spiritual support, ACP counseling assistance      Patient Active Problem List   Diagnosis Date Noted  . Acute on chronic congestive heart failure ([place])   . Hyperbilirubinemia   . Elevated LFTs   . Cholecystitis   . Goals of care, counseling/discussion   . Palliative care by specialist   . Cerebral embolism with cerebral infarction 09/04/2018  . Acute respiratory failure ([place])   . Cardiogenic shock ([place]) 09/07/2018    Palliative Care Assessment & Plan   Patient Profile: 42 y.o. male  with past medical history of substance abuse, HTN, advanced systolic heart failure likely d/t amphetamine use admitted on 08/26/2018 with increasing dyspnea that required intubation. Patient found to have acute on chronic heart failure. Echo on admission revealed EF of 10%. He is not candidate for VAD or transplant d/t substance abuse. Patient being treated for shock - mixed septic and cardiogenic. MRI on 10/25 revealed small right MCA infarct. Sputum positive for H. Influenzae. Appears to have pna and acute cholecystitis. Perc chole drain placed 10/20. Now spiking fevers. PMT consulted by CCM for [place].  Assessment: Septic/cardiogenic shocek Acute on chronic systolic CHF with EF 27% Acute respiratory failure Acute right MCA CVA Polysubstance abuse Metabolic encephalopathy H. Influenzae  pneumonia Pulmonary edema Acute cholecystitis Acute liver failure Acute kidney injury  Recommendations/Plan:  Transition to comfort measures only to allow comfort and dignity at EOL. Compassionate extubation this afternoon/early evening (wife waiting on gf to arrive before extubating).   Wife understands diagnoses, interventions, and  poor prognosis. Discussed at length shift to comfort.   Symptom management  Morphine 76m/hr continuous infusion.   Morphine bolus via infusion 1-480mIV q1558mprn pain/dyspnea/air hunger  Versed 2mg85m q1h prn anxiety/agiation  Robinul 0.2mg 59mq4h prn secretions  May need scopolamine patch if copious secretions once extubated  Discontinued interventions not aimed at comfort. RN to initiate comfort screen once extubated.   Anticipate hospital death.   Goals of Care and Additional Recommendations:  Limitations on Scope of Treatment: Full Comfort Care  Code Status: DNR   Code Status Orders  (From admission, onward)         Start     Ordered   09/07/18 1456  Full code  Continuous     09/07/18 1455        Code Status History    Date Active Date Inactive Code Status Order ID Comments User Context   09/06/2018 1507 09/07/2018 1455 DNR [id number] PiqueInpatient   09/01/2018 1918 09/06/2018 1507 Full Code [id number] BroodInpatient       Prognosis:   Hours-days once extubated  Discharge Planning:  Anticipated Hospital Death  Care plan was discussed with RN, family  Thank you for allowing the Palliative Medicine Team to assist in the care of this patient.   Time In: 1300 Time Out: 1415 Total Time 75 Prolonged Time Billed yes      Greater than 50%  of this time was spent counseling and coordinating care related to the above assessment and plan.  MeganIhor Dow-C Palliative Medicine Team  Phone: [phone] contact Palliative Medicine Team phone at [phone] questions and concerns.

## 2018-09-13 NOTE — Progress Notes (Signed)
1745: pt extubated at this time per MD order.  RN & family at bedside.

## 2018-09-14 DIAGNOSIS — Z7189 Other specified counseling: Secondary | ICD-10-CM

## 2018-09-14 DIAGNOSIS — K819 Cholecystitis, unspecified: Secondary | ICD-10-CM

## 2018-09-14 DIAGNOSIS — J9621 Acute and chronic respiratory failure with hypoxia: Secondary | ICD-10-CM

## 2018-09-14 DIAGNOSIS — Z515 Encounter for palliative care: Secondary | ICD-10-CM

## 2018-09-14 MED ORDER — MORPHINE BOLUS VIA INFUSION
4.0000 mg | INTRAVENOUS | Status: DC | PRN
  Administered 2018-09-14 (×2): 4 mg via INTRAVENOUS
  Administered 2018-09-14: 5 mg via INTRAVENOUS
  Filled 2018-09-14: qty 8

## 2018-09-14 MED ORDER — HALOPERIDOL LACTATE 2 MG/ML PO CONC
0.5000 mg | ORAL | Status: DC | PRN
  Filled 2018-09-14: qty 0.3

## 2018-09-14 MED ORDER — SODIUM CHLORIDE 0.9% FLUSH: 3.0000 mL | INTRAVENOUS | Status: DC | PRN

## 2018-09-14 MED ORDER — HALOPERIDOL 0.5 MG PO TABS
0.5000 mg | ORAL_TABLET | ORAL | Status: DC | PRN
  Filled 2018-09-14: qty 1

## 2018-09-14 MED ORDER — SODIUM CHLORIDE 0.9% FLUSH
3.0000 mL | Freq: Two times a day (BID) | INTRAVENOUS | Status: DC
  Administered 2018-09-14: 3 mL via INTRAVENOUS

## 2018-09-14 MED ORDER — SODIUM CHLORIDE 0.9 % IV SOLN: 250.0000 mL | INTRAVENOUS | Status: DC | PRN

## 2018-09-14 MED ORDER — GLYCOPYRROLATE 0.2 MG/ML IJ SOLN
0.6000 mg | Freq: Three times a day (TID) | INTRAMUSCULAR | Status: DC
  Administered 2018-09-14 (×2): 0.6 mg via INTRAVENOUS
  Filled 2018-09-14 (×3): qty 3

## 2018-09-14 MED ORDER — HALOPERIDOL LACTATE 5 MG/ML IJ SOLN: 0.5000 mg | INTRAMUSCULAR | Status: DC | PRN

## 2018-09-14 MED ORDER — POLYVINYL ALCOHOL 1.4 % OP SOLN
1.0000 [drp] | Freq: Four times a day (QID) | OPHTHALMIC | Status: DC | PRN
  Filled 2018-09-14: qty 15

## 2018-09-14 MED ORDER — BIOTENE DRY MOUTH MT LIQD: 15.0000 mL | OROMUCOSAL | Status: DC | PRN

## 2018-09-15 LAB — GLUCOSE, CAPILLARY: Glucose-Capillary: 104 mg/dL — ABNORMAL HIGH (ref 70–99)

## 2018-09-15 LAB — MISC LABCORP TEST (SEND OUT): SOURCE LABCORP: 1000

## 2018-09-15 LAB — HSV(HERPES SIMPLEX VRS) I + II AB-IGM: HSVI/II COMB AB IGM: 1 ratio — AB (ref 0.00–0.90)

## 2018-10-12 NOTE — Progress Notes (Signed)
Manually adjusted to account for time change.

## 2018-10-12 NOTE — Progress Notes (Signed)
Brother of patient Cody Patterson came to speak to this Rn at pod.  Cody Patterson expressed that he has "run off [address] and Janeal Holmes.  Leia Alf is not even his wife, common law marriage is not recognized in this state."  Expressed that the patient's mother does not know about her son being hospitalized and now on palliative care due to her health issues and being treated for breast cancer and GI bleed.  Step father Cletis Athens can be reached at [phone].  Cody Patterson does not want his "wife" Leia Alf to visit, nor his girlfriend Janeal Holmes. Cody Patterson can be reached at [phone].   Encouraged Cody Patterson to begin thinking about what his brother's wishes would be as far as cremation versus funeral home so decision can be made prior to his passing.  He called his sister and they are going to discuss with his step-father.

## 2018-10-12 NOTE — Progress Notes (Signed)
Daily Progress Note   Patient Name: Cody Patterson       Date: Sep 26, 2018 DOB: 1976/02/08  Age: 42 y.o. MRN#: [id number] Attending Physician: Rigoberto Noel, MD Primary Care Physician: Patient, No Pcp Per Admit Date: 08/22/2018  Reason for Consultation/Follow-up: Terminal Care  Subjective: Patient non-responsive and actively dying.  I reviewed the chart and spoke with Eric Form, CCM NP who updated me on his current status   Assessment: Patient is near death.  Essentially comfortable but with some increased work of breathing.   Patient Profile/HPI:  42 y.o.malewith past medical history of substance abuse, HTN, advanced systolic heart failure likely d/t amphetamineuseadmitted on10/16/2019with increasing dyspnea that required intubation. Patient found to have acute on chronic heart failure. Echo on admission revealed EF of 10%. He is not candidate for VAD or transplant d/t substance abuse. Patient being treated for shock - mixed septic and cardiogenic. MRI on 10/25 revealed small right MCA infarct. Sputum positive for H. Influenzae. Appears to have pna and acute cholecystitis. Perc chole drain placed 10/20.   Length of Stay: 18  Current Medications: Scheduled Meds:  . chlorhexidine gluconate (MEDLINE KIT)  15 mL Mouth Rinse BID  . glycopyrrolate  0.6 mg Intravenous TID  . mouth rinse  15 mL Mouth Rinse 10 times per day  . sodium chloride flush  10-40 mL Intracatheter Q12H  . sodium chloride flush  5 mL Intracatheter Q8H    Continuous Infusions: . sodium chloride Stopped (08/29/2018 0962)  . sodium chloride 10 mL/hr at 09/11/18 0700  . morphine 10 mg/hr (09-26-18 0600)    PRN Meds: glycopyrrolate, metoprolol tartrate, midazolam, morphine, sodium chloride flush  Physical Exam         Well developed, actively dying gentleman, 3+ jaundice, non-responsive, slightly diaphoretic CV tachy resp short stunted breaths, coarse rhonchorous breath sounds Ext 3+ edema thru out   Vital Signs: BP 100/81 (BP Location: Left Arm)   Pulse (!) 103   Temp (!) 101.2 F (38.4 C) (Axillary)   Resp (!) 22   Ht 6' (1.829 m)   Wt 107.5 kg   SpO2 (!) 84%   BMI 32.14 kg/m  SpO2: SpO2: (!) 84 % O2 Device: O2 Device: Room Air O2 Flow Rate:    Intake/output summary:   Intake/Output Summary (Last 24 hours) at 2018-09-26 [phone]  Last data filed at September 24, 2018 0600 Gross per 24 hour  Intake 859.56 ml  Output 761 ml  Net 98.56 ml   LBM: Last BM Date: 09/13/18 Baseline Weight: Weight: 92 kg Most recent weight: Weight: 107.5 kg       Palliative Assessment/Data: 10%    Flowsheet Rows     Most Recent Value  Intake Tab  Referral Department  Critical care  Unit at Time of Referral  ICU  Palliative Care Primary Diagnosis  Cardiac  Date Notified  09/05/18  Palliative Care Type  New Palliative care  Reason for referral  Clarify Goals of Care  Date of Admission  08/29/2018  Date first seen by Palliative Care  09/06/18  # of days Palliative referral response time  1 Day(s)  # of days IP prior to Palliative referral  9  Clinical Assessment  Palliative Performance Scale Score  10%  Psychosocial & Spiritual Assessment  Palliative Care Outcomes  Patient/Family meeting held?  Yes  Who was at the meeting?  wife  Palliative Care Outcomes  Clarified goals of care, Provided end of life care assistance, Improved pain interventions, Improved non-pain symptom therapy, Changed to focus on comfort, Provided psychosocial or spiritual support, ACP counseling assistance      Patient Active Problem List   Diagnosis Date Noted  . Acute on chronic congestive heart failure ([place])   . Hyperbilirubinemia   . Elevated LFTs   . Acute cholecystitis   . Terminal care   . Palliative care by specialist     . Cerebral embolism with cerebral infarction 09/04/2018  . Acute on chronic respiratory failure with hypoxia ([place])   . Cardiogenic shock ([place]) 09/08/2018    Palliative Care Plan    Recommendations/Plan:  Discussed with CCM.  Will initiate scheduled robinul out of concern for excess secretions causing additional aspiration.  Will also increase range of gtt and PRN boluses of morphine as patient has increased work of breathing.  OK with transfer to 6N  PMT will follow with you.  Goals of Care and Additional Recommendations:  Limitations on Scope of Treatment: Full Comfort Care  Code Status:  DNR  Prognosis:   Hours - Days.  Will likely pass today.   Discharge Planning:  Anticipated Hospital Death  Care plan was discussed with CCM NP  Thank you for allowing the Palliative Medicine Team to assist in the care of this patient.  Total time spent:  15 min.     Greater than 50%  of this time was spent counseling and coordinating care related to the above assessment and plan.  Florentina Jenny, PA-C Palliative Medicine  Please contact Palliative MedicineTeam phone at [phone] for questions and concerns between 7 am - 7 pm.   Please see AMION for individual provider pager numbers.

## 2018-10-12 NOTE — Progress Notes (Addendum)
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 Presented to [facility] with increasing dyspnea. Acute decompensation with hypotension and severe respiratory distress requiring intubation. Transferred to [facility] for management of sepsis, advanced heart failure.  Growing H influenza in sputum.  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  Past Medical History  Known cardiomyopathy and drug abuse (THC and methamphetamines)  Significant Hospital Events   10/16- Intubated at [facility] 10/17- Worsening shock, maxed on pressors, right heart cath indicative of distributive shock, high output failure 10/18-Started on milrinone, lasix for reduced cardiac output. PA numbers show worsening CO and high wedge 10/19- Started making urine, pressors weaning down, continues on milrinone. Surgery consulted for cholecystitis 10/20- Mucus plug overnight requiring bag, lavage. Went into afib.  Perc chole drain placed by IR 10/23 Start SBT trials, failed due to tachypnea 10/24 [facility] type respirations > CT head which showed stroke 10/26 -  spiking fevers 103 - swinging fevers since 09/03/18. Rising bilirubin. Rpt [place] c/w Acute choley - per IR yesterday - GB drain working well. CT chest/abd - GB drain ok. Stoool + . No abscess CCS suspecting this is cholestasis; not a candidate for surgery. Total Bili > IndirectBili. Remains on amio gtt, milrinone, Of levophed . MADE DNR by Palliative care 09/07/18 - fever + by peak of curve < 103F WBC down trend.  10/28 lines removed with  defervesce 10/30 stopped amio 11/1>> LFT's continue to climb 11/2>> LFT's continue to climb, Ammonia 176, palliative care to conference with family today 11/2 pm>> Transitioned to comfort care, extubated 11/2 at 17:45  Consults: date of consult/date signed off & final recs:  10/17- Cardiology  10/19- Surgery signed on 09/02/2017 10/24 Neurology 10/26 - pallaitive  Procedures (surgical and bedside):  10/16 [facility] R subclavian CVC >> 10/28 10/16 [facility] L femoral arterial line >> out  10/17 PA catheter >> 10/28 10/20 Perc chole drain RUE PICC 10/28 >>  Significant Diagnostic Tests:  Echocardiogram 10/17- Mild to moderate LV dilation with EF 20%, diffuse hypokinesis.  Restrictive diastolic function. Mildly dilated RV with mildly decreased systolic function. No significant valvular abnormalities Abdominal ultrasound 08/29/2018- severe gallbladder thickening, sludge, cholelithiasis, small volume of perihepatic, pericholecystic and perinephric free fluid. UDS Oval [place] 10/16- Amphetamines, THC [place] 10/18 Initial numbers RA 10 PA 46/33 (41) PCWP 10 Thermo CO/CI  15.2/7.0 Fick 14.1/6.5 (co-ox 86%) SVR 231 CT head 10/24 > small area of hypoattenuation in right parietal lobe suspicious for recent acute to subacute infarct. 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  Occluded R ICA, slow flow R MCA.  CT chest / abd 10/24 >> mild GGO, bibasal consolidation, perc drain well positioned , no other abd cause of fever  Micro Data:  Bcx 10/23, 10/25 >> ng Bcx 10/17 >> ng Trach aspirate 10/17- H influenza MRSA PR 10/16 - positive Blood 10/22 - neg as of 10/27 Trach aspirate 10/25 - moderate yeast Blood 10/25>> No growth   Antimicrobials:  Vancomycin 10/17 > 10/21. 10/24 > 10/29 Meropenen 10/17 > 10/31 Aztreonam 10/17>> off  SUBJECTIVE/OVERNIGHT/INTERVAL HX    Transitioned to comfort care 11/2.  Currently on morphine gtt and appears comfortable. No family at bedside at present    Objective   Blood pressure 100/81, pulse (!) 103, temperature (!) 101.2 F (38.4 C), temperature source Axillary, resp. rate (!) 22, height 6' (1.829 m), weight 107.5 kg, SpO2 (!) 84 %.    Vent Mode: PRVC FiO2 (%):  [40 %] 40 % Set Rate:  [14 bmp] 14 bmp Vt Set:  [540 mL] 540 mL PEEP:  [5 cmH20] 5 cmH20 Plateau Pressure:  [23 cmH20] 23 cmH20   Intake/Output Summary (Last 24 hours) at 10/12/18 0817 Last data filed at 10-12-2018 0600 Gross per 24 hour  Intake 859.56 ml  Output 761 ml  Net 98.56 ml   Filed Weights   09/11/18 0500 09/12/18 0500 09/13/18 0251  Weight: 103.3 kg 104.6 kg 107.5 kg   Physical Exam:  Acutely ill young man,  jaundiced, unresponsive , currently on morphine gtt NCAT ETT, OGT, Jaundiced, trace JVD, 2+ edema Bilateral chest excursion, Shallow respirations, RR 22, Rhonchi R>L  S1-S2 regular, no murmur, rub or gallop Soft nontender abdomen, distended, BS diminished, TF at goal, hepatic drain with bilious drainage Morphine gtt, does not follow commands,  pupils 3 mm bilaterally equally reactive to light. Multiple tattoos, otherwise dry and intact, no rash, lesions   Assessment & Plan:   Full Comfort Care Extubated to RA 11/2 Plan Per palliation Continue Morphine gtt titrate for comfort Discontinue all interventions not aimed at comfort Anticipate hospital death Transfer to 6N Palliation       Disposition / Summary of Today's Plan 10/12/2018   Full comfort care  Best Practice    Code Status: DNR per wife Family Communication: No family at bedside 11/3  Full comfort care  [place], AGACNP-BC [place] Pager # [phone]   October 12, 2018 8:17 AM  Attending Note:  42 year old s/p cardiac arrest and anoxic injury now with renal, liver, respiratory and heart failure.  Palliative care met  with the family, there are significant amount of family drama in this difficult situation.  On exam, he is unresponsive and not following commands.  I reviewed CXR myself, trach is in a good position.  Discussed with PCCM-NP.  Will start morphine drip.  Emphasize comfort.  Will transfer to 6N and to [facility] service with PCCM off 11/4.  The patient is critically ill with multiple organ systems failure and requires high complexity decision making for assessment and support, frequent evaluation and titration of therapies, application of advanced monitoring technologies and extensive interpretation of multiple databases.   Critical Care Time devoted to patient care services described in this note is  32  Minutes. This time reflects time of care of this signee Dr Jennet Maduro. This critical care time does not reflect procedure time, or teaching time or supervisory time of PA/NP/Med student/Med Resident etc but could involve care discussion time.  Rush Farmer, M.D. [facility] Pulmonary/Critical Care Medicine. Pager: [phone]. After hours pager: [phone].

## 2018-10-12 NOTE — Progress Notes (Signed)
Telephone call received from Tom from [facility].  Advised that patient is NOT a candidate for organ donation but will be evaluated for tissue and eye donation at time of death.  Call [facility] once patient passes. Reference number is [id number]

## 2018-10-12 NOTE — Progress Notes (Signed)
Telephone call received from patient's step-father Waldron Session at [phone].  Step-father has chosen [employer] in [place].  Their number is [phone].

## 2018-10-12 NOTE — Progress Notes (Addendum)
Telephone call made to Janeal Holmes, girlfriend of patient to advise her of personal belongings being sent to room 6N-25.  Janeal Holmes stated that the black and red duffle bag belongs to the patient.  Purse with sequined pink heart belongs to [facility] and clothing as well. Brother of patient advised that he would have his step dad take the belongings to [place].

## 2018-10-12 NOTE — Progress Notes (Signed)
Patient transferred to [facility] room 25 via N bed on Morphine gtt at 11mg /hr/  Patient continues to have labored respirations at 22-24/min..  Bolus Morphine dose of 5mg  IV administered for comfort.

## 2018-10-12 NOTE — Progress Notes (Addendum)
Patient not breathing, no heart beat. Harriett Sine, RN verified time of death [phone]. Paged provider, Leia Alf and friends at bedside.   0045 wasted 60 mL morphine with RN Darlina Sicilian.  [phone] called LM for step-father Star Age.  Called and spoke with brother Onalee Hua, he had already been notified.

## 2018-10-12 NOTE — Death Summary Note (Signed)
DEATH SUMMARY   Patient Details  Name: Cody Patterson MRN: [id number] DOB: 1975-11-16  Admission/Discharge Information   Admit Date:  09/22/2018  Date of Death: Date of Death: 10/10/2018  Time of Death: Time of Death: 02-06-57  Length of Stay: 2023-02-23  Referring Physician: Patient, No Pcp Per   Reason(s) for Hospitalization  Cardiogenic shock  Diagnoses  Preliminary cause of death:   Cardiogenic shock Secondary Diagnoses (including complications and co-morbidities):  Active Problems:   Cardiogenic shock (HCC)   Acute on chronic respiratory failure with hypoxia (HCC)   Cerebral embolism with cerebral infarction   Terminal care   Palliative care by specialist   Elevated LFTs   Acute cholecystitis   Acute on chronic congestive heart failure (HCC)   Hyperbilirubinemia   Cholecystitis   Comfort measures only status   Goals of care, counseling/discussion   Palliative care encounter   Brief Hospital Course (including significant findings, care, treatment, and services provided and events leading to death)  42 year old man with history of 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 Presented to [facility] with increasing dyspnea. Acute decompensation with hypotension and severe respiratory distress requiring intubation. Transferred to [facility] for management of sepsis, advanced heart failure.  Growing H influenza in sputum.  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  Patient was admitted to the ICU and palliative care met with the patient's family.  After the meeting the decision was made to proceed with comfort care and termination of life sustaining measures.  Under the guidance of palliative care (please see note from PMT service) patient was extubated to expire shortly thereafter with the  family bedside.  Pertinent Labs and Studies  Significant Diagnostic Studies Ct Abdomen Pelvis Wo Contrast  Result Date: 09/06/2018 CLINICAL DATA:  Pt is septic, s/p chole drain placed 10/20. Pt has continued to run a fever the last several days. Concern for possible infection. EXAM: CT CHEST, ABDOMEN AND PELVIS WITHOUT CONTRAST TECHNIQUE: Multidetector CT imaging of the chest, abdomen and pelvis was performed following the standard protocol without IV contrast. COMPARISON:  Abdomen ultrasound, 09/05/2018. CTs, 07/12/2014 and 07/05/2014. FINDINGS: CT CHEST FINDINGS Cardiovascular: Heart is mildly enlarged. No pericardial effusion. Left and right coronary artery calcifications. Great vessels are normal in caliber. No aortic atherosclerotic calcifications. Mediastinum/Nodes: No neck base or axillary masses or enlarged lymph nodes. No mediastinal or hilar masses or enlarged lymph nodes. Endotracheal tube tip projects 4 cm above the Carina. Right internal jugular central venous line tip lies at the caval atrial junction. Nasal/orogastric tube passes below the diaphragm into the mid stomach. Lungs/Pleura: Small bilateral pleural effusions. There is dependent opacity in both lower lobes with adjacent ground-glass type opacities. Subtle hazy ground-glass opacity is noted in the posterior right upper lobe and in the posterolateral left upper lobe. There is no interstitial thickening or vascular congestion to suggest pulmonary edema. No lung mass or discrete nodule. No pneumothorax. Musculoskeletal: Developmentally small disks from T3 through T5. No fracture or acute finding. No osteoblastic or osteolytic lesions. CT ABDOMEN PELVIS FINDINGS Hepatobiliary: Liver normal in size with no mass or focal lesion. Gallbladder is been decompressed with a cholecystotomy tube. There is no fluid collection in the gallbladder fossa to suggest a bile leak or abscess. No bile duct dilation. Pancreas: Unremarkable. No pancreatic ductal  dilatation or surrounding inflammatory changes. Spleen: Normal in size without  focal abnormality. Adrenals/Urinary Tract: No adrenal masses. Kidneys normal in size, orientation and position. No renal masses, stones or hydronephrosis. Normal ureters. Bladder decompressed with a Foley catheter. Stomach/Bowel: Stomach is unremarkable. Small bowel is normal caliber. No wall thickening or inflammation. Mild distention of the right colon. No colonic wall thickening or inflammation. Mild generalized increased stool burden noted in the colon most evident in the right colon. Appendix not discretely visualized. No evidence of appendicitis. Vascular/Lymphatic: Aortic atherosclerosis. No enlarged abdominal or pelvic lymph nodes. Reproductive: Unremarkable. Other: Minimal ascites noted adjacent to the liver and in the pelvis. Diffuse subcutaneous edema noted along the lower abdomen and pelvis extending to the proximal lower extremities. No abdominal wall hernia. Musculoskeletal: No acute or significant osseous findings. IMPRESSION: CHEST CT 1. There are areas of ground-glass opacity in the lungs. This may be due to mild airspace edema or infection/inflammation. 2. There is dependent opacity in both lower lobes that is most consistent with atelectasis, associated with small effusions. 3. Mild cardiomegaly. ABDOMEN AND PELVIS CT 1. Status post cholecystotomy for decompression. Tube is well positioned. No gallbladder fossa collection to suggest a bile leak or abscess. 2. Minimal amount of ascites noted adjacent to the liver and in the pelvis. This is nonspecific and along with lower abdominal and pelvic soft tissue edema and small pleural effusions suggests a diffuse edematous state. 3. No acute findings within the abdomen or pelvis. 4. Mild generalized increased stool in the colon. No bowel inflammation or obstruction. Electronically Signed   By: Lajean Manes M.D.   On: 09/06/2018 14:33   Dg Abd 1 View  Result Date:  09/07/2018 CLINICAL DATA:  Enteric tube placement. Cooling blanket could not be removed for image. EXAM: ABDOMEN - 1 VIEW COMPARISON:  None. FINDINGS: Enteric tube courses through the stomach with tip in the midline likely over the distal stomach. Percutaneous pigtail drainage catheter over the right upper quadrant. Bowel gas pattern is nonobstructive. Visualized lung bases are within normal. IMPRESSION: Nonobstructive bowel gas pattern. Nasogastric tube with tip over the distal stomach in the midline abdomen. Electronically Signed   By: Marin Olp M.D.   On: 09/07/2018 23:52   Dg Abd 1 View  Result Date: 08/26/2018 CLINICAL DATA:  Abdominal distention. EXAM: ABDOMEN - 1 VIEW COMPARISON:  None. FINDINGS: NG tube is in the mid stomach. Moderate gas and stool within the colon. No evidence of bowel obstruction. No organomegaly or visible free air. IMPRESSION: NG tube tip in the mid stomach. Moderate gas and stool within the colon. Electronically Signed   By: Rolm Baptise M.D.   On: 08/23/2018 08:48   Ct Head Wo Contrast  Result Date: 09/04/2018 CLINICAL DATA:  42 y/o  M; decreased responsiveness, encephalopathy. EXAM: CT HEAD WITHOUT CONTRAST TECHNIQUE: Contiguous axial images were obtained from the base of the skull through the vertex without intravenous contrast. COMPARISON:  10/13/2015 CT head. FINDINGS: Brain: Small area of hypoattenuation with focal loss of gray-white differentiation in the right parietal lobe. No associated hemorrhage or mass effect. No extra-axial collection, hydrocephalus, or herniation. Vascular: Calcific atherosclerosis of carotid siphons. No hyperdense vessel identified. Skull: Normal. Negative for fracture or focal lesion. Sinuses/Orbits: No acute finding. Other: None. IMPRESSION: Small area of hypoattenuation with focal loss of gray-white differentiation in the right parietal lobe, suspected recent acute to subacute infarction. No associated hemorrhage or mass effect. These  results will be called to the ordering clinician or representative by the Radiologist Assistant, and communication documented in the PACS  or zVision Dashboard. Electronically Signed   By: Kristine Garbe M.D.   On: 09/04/2018 15:18   Ct Chest Wo Contrast  Result Date: 09/06/2018 CLINICAL DATA:  Pt is septic, s/p chole drain placed 10/20. Pt has continued to run a fever the last several days. Concern for possible infection. EXAM: CT CHEST, ABDOMEN AND PELVIS WITHOUT CONTRAST TECHNIQUE: Multidetector CT imaging of the chest, abdomen and pelvis was performed following the standard protocol without IV contrast. COMPARISON:  Abdomen ultrasound, 09/05/2018. CTs, 07/12/2014 and 07/05/2014. FINDINGS: CT CHEST FINDINGS Cardiovascular: Heart is mildly enlarged. No pericardial effusion. Left and right coronary artery calcifications. Great vessels are normal in caliber. No aortic atherosclerotic calcifications. Mediastinum/Nodes: No neck base or axillary masses or enlarged lymph nodes. No mediastinal or hilar masses or enlarged lymph nodes. Endotracheal tube tip projects 4 cm above the Carina. Right internal jugular central venous line tip lies at the caval atrial junction. Nasal/orogastric tube passes below the diaphragm into the mid stomach. Lungs/Pleura: Small bilateral pleural effusions. There is dependent opacity in both lower lobes with adjacent ground-glass type opacities. Subtle hazy ground-glass opacity is noted in the posterior right upper lobe and in the posterolateral left upper lobe. There is no interstitial thickening or vascular congestion to suggest pulmonary edema. No lung mass or discrete nodule. No pneumothorax. Musculoskeletal: Developmentally small disks from T3 through T5. No fracture or acute finding. No osteoblastic or osteolytic lesions. CT ABDOMEN PELVIS FINDINGS Hepatobiliary: Liver normal in size with no mass or focal lesion. Gallbladder is been decompressed with a cholecystotomy tube.  There is no fluid collection in the gallbladder fossa to suggest a bile leak or abscess. No bile duct dilation. Pancreas: Unremarkable. No pancreatic ductal dilatation or surrounding inflammatory changes. Spleen: Normal in size without focal abnormality. Adrenals/Urinary Tract: No adrenal masses. Kidneys normal in size, orientation and position. No renal masses, stones or hydronephrosis. Normal ureters. Bladder decompressed with a Foley catheter. Stomach/Bowel: Stomach is unremarkable. Small bowel is normal caliber. No wall thickening or inflammation. Mild distention of the right colon. No colonic wall thickening or inflammation. Mild generalized increased stool burden noted in the colon most evident in the right colon. Appendix not discretely visualized. No evidence of appendicitis. Vascular/Lymphatic: Aortic atherosclerosis. No enlarged abdominal or pelvic lymph nodes. Reproductive: Unremarkable. Other: Minimal ascites noted adjacent to the liver and in the pelvis. Diffuse subcutaneous edema noted along the lower abdomen and pelvis extending to the proximal lower extremities. No abdominal wall hernia. Musculoskeletal: No acute or significant osseous findings. IMPRESSION: CHEST CT 1. There are areas of ground-glass opacity in the lungs. This may be due to mild airspace edema or infection/inflammation. 2. There is dependent opacity in both lower lobes that is most consistent with atelectasis, associated with small effusions. 3. Mild cardiomegaly. ABDOMEN AND PELVIS CT 1. Status post cholecystotomy for decompression. Tube is well positioned. No gallbladder fossa collection to suggest a bile leak or abscess. 2. Minimal amount of ascites noted adjacent to the liver and in the pelvis. This is nonspecific and along with lower abdominal and pelvic soft tissue edema and small pleural effusions suggests a diffuse edematous state. 3. No acute findings within the abdomen or pelvis. 4. Mild generalized increased stool in the  colon. No bowel inflammation or obstruction. Electronically Signed   By: Lajean Manes M.D.   On: 09/06/2018 14:33   Mr [name] Head Wo Contrast  Result Date: 09/05/2018 CLINICAL DATA:  Altered mental status, follow-up infarct. History of polysubstance abuse, cardiomyopathy. EXAM: MRI  HEAD WITHOUT CONTRAST MRA HEAD WITHOUT CONTRAST TECHNIQUE: Multiplanar, multiecho pulse sequences of the brain and surrounding structures were obtained without intravenous contrast. Angiographic images of the head were obtained using MRA technique without contrast. COMPARISON:  CT HEAD September 04, 2018 FINDINGS: MRI HEAD FINDINGS INTRACRANIAL CONTENTS: Patchy reduced diffusion RIGHT parietal lobe, with subcentimeter foci reduced diffusion RIGHT frontal and temporal lobes, areas of diffusion abnormality demonstrate faintly decreased ADC values. No susceptibility artifact to suggest hemorrhage or septic emboli. A few scattered subcentimeter supratentorial white matter FLAIR T2 hyperintensities exclusive aforementioned abnormality. No midline shift, mass effect or masses. No abnormal extra-axial fluid collections. Borderline parenchymal brain volume loss for age. No hydrocephalus. VASCULAR: T2 bright signal RIGHT ICA. SKULL AND UPPER CERVICAL SPINE: No abnormal sellar expansion. No suspicious calvarial bone marrow signal. Craniocervical junction maintained. SINUSES/ORBITS: Bilateral mastoid effusions. Paranasal sinuses are well aerated. Secretions layering in the nasopharynx. Included ocular globes and orbital contents are non-suspicious. OTHER: Patient is edentulous.  Life support lines in place. MRA HEAD FINDINGS ANTERIOR CIRCULATION: Loss of RIGHT cervical internal carotid artery flow related enhancement, thready within RIGHT petrous to supraclinoid segments. Patent LEFT internal carotid artery. Patent anterior communicating artery and anterior cerebral arteries. Patent though slow flow RIGHT middle cerebral artery. Normal LEFT MCA.  No flow limiting stenosis, aneurysm. POSTERIOR CIRCULATION: Codominant vertebral arteries. Vertebrobasilar arteries are patent, with normal flow related enhancement of the main branch vessels. Patent posterior cerebral arteries. No large vessel occlusion, flow limiting stenosis,  aneurysm. ANATOMIC VARIANTS: None. Source images and MIP images were reviewed. IMPRESSION: MRI HEAD: 1. 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 2. Borderline parenchymal brain volume loss for age. 3. Minimal chronic small vessel ischemic changes. MRA HEAD: 1. 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 Recommend CTA head and neck. These results will be called to the ordering clinician or representative by the Radiologist Assistant, and communication documented in the PACS or zVision Dashboard. Electronically Signed   By: Elon Alas M.D.   On: 09/05/2018 06:30   Mr Brain Wo Contrast  Result Date: 09/05/2018 CLINICAL DATA:  Altered mental status, follow-up infarct. History of polysubstance abuse, cardiomyopathy. EXAM: MRI HEAD WITHOUT CONTRAST MRA HEAD WITHOUT CONTRAST TECHNIQUE: Multiplanar, multiecho pulse sequences of the brain and surrounding structures were obtained without intravenous contrast. Angiographic images of the head were obtained using MRA technique without contrast. COMPARISON:  CT HEAD September 04, 2018 FINDINGS: MRI HEAD FINDINGS INTRACRANIAL CONTENTS: Patchy reduced diffusion RIGHT parietal lobe, with subcentimeter foci reduced diffusion RIGHT frontal and temporal lobes, areas of diffusion abnormality demonstrate faintly decreased ADC values. No susceptibility artifact to suggest hemorrhage or septic emboli. A few scattered subcentimeter supratentorial white matter FLAIR T2 hyperintensities exclusive aforementioned abnormality. No midline shift, mass effect or masses. No abnormal extra-axial fluid  collections. Borderline parenchymal brain volume loss for age. No hydrocephalus. VASCULAR: T2 bright signal RIGHT ICA. SKULL AND UPPER CERVICAL SPINE: No abnormal sellar expansion. No suspicious calvarial bone marrow signal. Craniocervical junction maintained. SINUSES/ORBITS: Bilateral mastoid effusions. Paranasal sinuses are well aerated. Secretions layering in the nasopharynx. Included ocular globes and orbital contents are non-suspicious. OTHER: Patient is edentulous.  Life support lines in place. MRA HEAD FINDINGS ANTERIOR CIRCULATION: Loss of RIGHT cervical internal carotid artery flow related enhancement, thready within RIGHT petrous to supraclinoid segments. Patent LEFT internal carotid artery. Patent anterior communicating artery and anterior cerebral arteries. Patent though slow flow RIGHT middle cerebral artery. Normal LEFT  MCA. No flow limiting stenosis, aneurysm. POSTERIOR CIRCULATION: Codominant vertebral arteries. Vertebrobasilar arteries are patent, with normal flow related enhancement of the main branch vessels. Patent posterior cerebral arteries. No large vessel occlusion, flow limiting stenosis,  aneurysm. ANATOMIC VARIANTS: None. Source images and MIP images were reviewed. IMPRESSION: MRI HEAD: 1. 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 2. Borderline parenchymal brain volume loss for age. 3. Minimal chronic small vessel ischemic changes. MRA HEAD: 1. 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 Recommend CTA head and neck. These results will be called to the ordering clinician or representative by the Radiologist Assistant, and communication documented in the PACS or zVision Dashboard. Electronically Signed   By: Elon Alas M.D.   On: 09/05/2018 06:30   US Abdomen Complete  Result Date: 08/29/2018 CLINICAL DATA:  42 year old male with renal failure, elevated LFTs. EXAM:  ABDOMEN ULTRASOUND COMPLETE COMPARISON:  CT Abdomen and Pelvis 07/12/2014. FINDINGS: Gallbladder: Floating gallstone measuring 19 millimeters is noted on image 4. There is abundant superimposed dependent gallbladder sludge. There is moderate to severe gallbladder wall thickening up to 13 millimeters. There is trace pericholecystic fluid. However, no sonographic Murphy sign was elicited. Common bile duct: Diameter: 3 millimeters, normal. Liver: Trace perihepatic free fluid. No focal lesion identified. Within normal limits in parenchymal echogenicity. Portal vein is patent on color Doppler imaging with normal direction of blood flow towards the liver. IVC: Incompletely visualized due to overlying bowel gas, visualized portions within normal limits. Pancreas: Visualized portion unremarkable. Spleen: Size and appearance within normal limits. Right Kidney: Length: 14.4 centimeters. Echogenicity within normal limits. No mass or hydronephrosis visualized. Small volume of right perinephric free fluid (image 88). Left Kidney: Length: 13.2 centimeters. Echogenicity within normal limits. No mass or hydronephrosis visualized. Small volume of left perinephric fluid suspected on image 105. Abdominal aorta: No aneurysm visualized. Other findings: None. IMPRESSION: 1. Abnormal gallbladder with severe wall thickening, sludge, and cholelithiasis. Despite absent sonographic Murphy sign ACUTE CHOLECYSTITIS should be considered. 2. Small volume of perihepatic and pericholecystic free fluid. No evidence of bile duct obstruction. 3. Small volume of bilateral perinephric free fluid which may relate to renal failure. No hydronephrosis and otherwise negative ultrasound appearance of both kidneys. Electronically Signed   By: Genevie Ann M.D.   On: 08/29/2018 11:19   Ir Perc Cholecystostomy  Result Date: 08/31/2018 INDICATION: 42 year old male with a history of acute cholecystitis, sepsis, shock, multi organ failure EXAM: PERCUTANEOUS  CHOLECYSTOSTOMY MEDICATIONS: Multiple ICU medications including antibiotics ANESTHESIA/SEDATION: None. FLUOROSCOPY TIME:  Fluoroscopy Time: 0 minutes 18 seconds (3 mGy). COMPLICATIONS: None PROCEDURE: Informed written consent was obtained from the patient's family after a thorough discussion of the procedural risks, benefits and alternatives. All questions were addressed. Maximal Sterile Barrier Technique was utilized including caps, mask, sterile gowns, sterile gloves, sterile drape, hand hygiene and skin antiseptic. A timeout was performed prior to the initiation of the procedure. Ultrasound survey of the right upper quadrant was performed for planning purposes. Once the patient is prepped and draped in the usual sterile fashion, the skin and subcutaneous tissues overlying the gallbladder were generously infiltrated 1% lidocaine for local anesthesia. A coaxial needle was advanced under ultrasound guidance through the skin subcutaneous tissues and a small segment of liver into the gallbladder lumen. With removal of the stylet, spontaneous dark bile drainage occurred. Using modified Seldinger technique, a 10 French drain was placed into the gallbladder fossa, with aspiration of the  sample for the lab. Contrast injection confirmed position of the tube within the gallbladder lumen. Drainage catheter was attached to gravity drain with a suture retention placed. Patient tolerated the procedure well and remained hemodynamically stable throughout. No complications were encountered and no significant blood loss encountered. IMPRESSION: Status post image guided percutaneous cholecystostomy. Signed, Dulcy Fanny. Dellia Nims, RPVI [facility] Electronically Signed   By: Corrie Mckusick D.O.   On: 08/31/2018 11:22   Dg Chest Port 1 View  Result Date: 09/12/2018 CLINICAL DATA:  ET tube, respiratory failure EXAM: PORTABLE CHEST 1 VIEW COMPARISON:  09/11/2018 FINDINGS:  Support devices are stable. Heart is normal size. No confluent airspace opacities or effusions. No acute bony abnormality. IMPRESSION: Support devices stable. No acute cardiopulmonary disease. Electronically Signed   By: Rolm Baptise M.D.   On: 09/12/2018 08:59   Dg Chest Port 1 View  Result Date: 09/11/2018 CLINICAL DATA:  42 year old male with bacteremia, sepsis, respiratory failure, right ICA occlusion with small right MCA infarcts. EXAM: PORTABLE CHEST 1 VIEW COMPARISON:  09/10/2018 and earlier. FINDINGS: Portable AP semi upright view at 1218 hours. Endotracheal tube tip in good position between the level the clavicles and carina. Stable right PICC line. Enteric tube courses to the left abdomen, tip not included. Larger lung volumes. Allowing for portable technique the lungs are clear. Normal cardiac size and mediastinal contours. No pneumothorax or pleural effusion. No acute osseous abnormality identified. IMPRESSION: 1. Satisfactory ET tube position.  Otherwise stable lines and tubes. 2. No acute cardiopulmonary abnormality. Electronically Signed   By: Genevie Ann M.D.   On: 09/11/2018 12:35   Dg Chest Port 1 View  Result Date: 09/10/2018 CLINICAL DATA:  42 year old male with respiratory failure, intubated EXAM: PORTABLE CHEST 1 VIEW COMPARISON:  Prior chest x-ray yesterday, 09/09/2018 FINDINGS: The patient remains intubated. The tip of the endotracheal tube is 5.4 cm above the carina. Right subclavian central venous catheter has been removed. A right upper extremity PICC is present. The tip overlies the cavoatrial junction. Gastric tube also present. The tip lies below the field of view, presumably within the stomach. Stable cardiomegaly. Mediastinal contours remain normal. Relatively low inspiratory volumes with mild bibasilar atelectasis. No pulmonary edema, pneumothorax or large effusion. No focal airspace consolidation. IMPRESSION: Persistently low inspiratory volumes with mild bibasilar  atelectasis. Stable cardiomegaly. Interval removal of right subclavian central venous catheter. Other support apparatus in stable and satisfactory position. Electronically Signed   By: Jacqulynn Cadet M.D.   On: 09/10/2018 10:50   Dg Chest Port 1 View  Result Date: 09/09/2018 CLINICAL DATA:  Acute respiratory failure. EXAM: PORTABLE CHEST 1 VIEW COMPARISON:  Radiograph of September 08, 2018. FINDINGS: Stable cardiomediastinal silhouette. Endotracheal and nasogastric tubes are in grossly good position. Right subclavian catheter and PICC line are unchanged in position. No acute pulmonary disease is noted. No pneumothorax or pleural effusion is noted. Bony thorax is unremarkable. IMPRESSION: Stable support apparatus.  No acute abnormality seen. Electronically Signed   By: Marijo Conception, M.D.   On: 09/09/2018 09:37   Dg Chest Port 1 View  Result Date: 09/08/2018 CLINICAL DATA:  42 year old male with respiratory failure. Subsequent encounter. EXAM: PORTABLE CHEST 1 VIEW COMPARISON:  09/06/2018 CT.  09/05/2018 chest x-ray. FINDINGS: Right central line tip distal superior vena cava. Right internal jugular introducer catheter unchanged in position with tip projecting just above superior vena cava formation. Endotracheal tube tip 4.8 cm above the carina. Nasogastric tube courses below  the diaphragm. Tip is not included on the present exam. No pneumothorax. Interval improved aeration lung bases with apparent clearing of basilar consolidation. Heart size within normal limits. IMPRESSION: Interval improved aeration of lung bases with apparent clearing of previously noted basilar infiltrate/atelectasis. Subtle ground-glass opacity seen throughout remainder of lungs on recent chest CT not appreciated on the present plain film exam and may also have cleared in the interim. Electronically Signed   By: Genia Del M.D.   On: 09/08/2018 08:49   Dg Chest Port 1 View  Result Date: 09/05/2018 CLINICAL DATA:   42 year old male with a history of endotracheal tube placement EXAM: PORTABLE CHEST 1 VIEW COMPARISON:  09/04/2018 FINDINGS: Cardiomediastinal silhouette unchanged. Improved aeration compared to the prior plain film with persistently low lung volumes. No pneumothorax. No large pleural effusion. Similar appearance of coarsened interstitial markings. Endotracheal tube terminates suitably above the carina approximately 5 cm. Unchanged right subclavian central catheter terminating in the region of the superior cavoatrial junction. Unchanged right IJ sheath. Unchanged gastric tube terminating out of the field of view. IMPRESSION: Improved aeration at the comparison plain film, with evidence of mild edema and/or atelectasis. Endotracheal tube terminates suitably above the carina. Unchanged right subclavian central catheter, right IJ sheath, and gastric tube Electronically Signed   By: Corrie Mckusick D.O.   On: 09/05/2018 11:00   Dg Chest Port 1 View  Result Date: 09/04/2018 CLINICAL DATA:  Respiratory failure. EXAM: PORTABLE CHEST 1 VIEW COMPARISON:  Radiograph of September 03, 2018. FINDINGS: Stable position of endotracheal and nasogastric tubes. Stable position of right subclavian catheter. Stable position of right internal jugular venous sheath. No pneumothorax is noted. Stable bibasilar subsegmental atelectasis is noted, right greater than left. No significant pleural effusion is noted. Bony thorax is unremarkable. IMPRESSION: Stable support apparatus. Stable bibasilar subsegmental atelectasis. Electronically Signed   By: Marijo Conception, M.D.   On: 09/04/2018 10:33   Dg Chest Port 1 View  Result Date: 09/03/2018 CLINICAL DATA:  Acute respiratory failure.  Follow-up exam. EXAM: PORTABLE CHEST 1 VIEW COMPARISON:  09/02/2018 and older exams FINDINGS: Endotracheal tube tip projects 3.8 cm above the Carina, without significant change from prior study allowing for differences in patient positioning. Right internal  jugular introducer sheath and right subclavian central venous line are stable from previous day's study. New nasal/orogastric tube passes below the diaphragm, well into the stomach and below the included field of view. Stable cardiac enlargement. Mild basilar lung opacities consistent with atelectasis. No convincing pneumonia. No pulmonary edema. No pneumothorax. IMPRESSION: 1. No convincing pneumonia. No pulmonary edema. Mild basilar atelectasis similar to previous day's study allowing for the differences in patient positioning. 2. New nasal/orogastric tube is well positioned. Remaining support apparatus is stable and well positioned. Electronically Signed   By: Lajean Manes M.D.   On: 09/03/2018 10:54   Dg Chest Port 1 View  Result Date: 09/02/2018 CLINICAL DATA:  Endotracheal tube check EXAM: PORTABLE CHEST 1 VIEW COMPARISON:  09/01/2018 FINDINGS: Endotracheal tube is 5.7 cm from carina retraction of Swan-Ganz catheter. Central venous line and sheath remain. No pneumothorax. No pulmonary edema. IMPRESSION: 1. Endotracheal tube in good position. 2. Retraction of Swan-Ganz catheter. 3. No edema or pneumothorax. Electronically Signed   By: Suzy Bouchard M.D.   On: 09/02/2018 10:14   Dg Chest Port 1 View  Result Date: 09/01/2018 CLINICAL DATA:  Hypoxia EXAM: PORTABLE CHEST 1 VIEW COMPARISON:  August 31, 2018 FINDINGS: Endotracheal tube tip is 4.5 cm above the  carina. Central catheter tip is in the main pulmonary outflow tract. Right subclavian catheter tip is in the superior vena cava near the cavoatrial junction. Nasogastric tube tip and side port are below the diaphragm. No pneumothorax. There is a nipple shadow on the left. There is no edema or consolidation. Heart is borderline enlarged with pulmonary vascularity normal. No adenopathy. No bone lesions. IMPRESSION: Tube and catheter positions as described without pneumothorax. No edema or consolidation. Mild cardiac prominence. Electronically Signed    By: Lowella Grip III M.D.   On: 09/01/2018 08:02   Dg Chest Port 1 View  Result Date: 08/31/2018 CLINICAL DATA:  Acute respiratory failure. EXAM: PORTABLE CHEST 1 VIEW COMPARISON:  None. FINDINGS: Endotracheal tube, nasogastric 2, right subclavian central venous catheter and right IJ Swan-Ganz catheter are unchanged. Lungs are adequately inflated with stable left base/retrocardiac hazy opacification suggesting small left effusion with associated atelectasis. Mild stable cardiomegaly. Remainder of the exam is unchanged. IMPRESSION: Stable hazy left base opacification likely small effusion with atelectasis. Tubes and lines as described. Electronically Signed   By: Marin Olp M.D.   On: 08/31/2018 14:18   Dg Chest Port 1 View  Result Date: 08/30/2018 CLINICAL DATA:  Acute respiratory failure. EXAM: PORTABLE CHEST 1 VIEW COMPARISON:  One-view chest x-ray 08/29/2018 FINDINGS: Heart is enlarged. Endotracheal tube is scratched at the endotracheal tube terminates 6 cm above the carina, in satisfactory position. The tip of a Swan-Ganz catheter is in the main pulmonary outflow tract. The side port of the NG tube is in the stomach. For bladder pads are in place. The warming/cooling blanket is in place. Mild edema is stable. Left basilar airspace disease is noted. IMPRESSION: 1. Cardiomegaly with mild edema. 2. Left basilar airspace disease likely reflects atelectasis. Infection is not excluded. 3. Support apparatus is stable and in satisfactory position. Electronically Signed   By: San Morelle M.D.   On: 08/30/2018 08:26   Dg Chest Port 1 View  Result Date: 08/29/2018 CLINICAL DATA:  Check endotracheal tube placement EXAM: PORTABLE CHEST 1 VIEW COMPARISON:  08/31/2018 FINDINGS: Endotracheal tube, Swan-Ganz catheter, right subclavian central line and nasogastric catheter are noted and stable in appearance. Cardiac shadow is enlarged but stable. Stable left basilar infiltrate is noted when  compare with the previous day. No new focal abnormality is noted. IMPRESSION: Stable left basilar infiltrate. Tubes and lines as described. Electronically Signed   By: Inez Catalina M.D.   On: 08/29/2018 08:09   Dg Chest Port 1 View  Result Date: 08/30/2018 CLINICAL DATA:  Recent hypoxia EXAM: PORTABLE CHEST 1 VIEW COMPARISON:  10/17/9 FINDINGS: Cardiac shadow remains enlarged. Endotracheal tube and nasogastric catheter are stable. Swan-Ganz catheter and right subclavian catheter are stable as well. Lungs are well aerated bilaterally. Patchy left retrocardiac density is noted. Stable central vascular congestion is noted without significant interstitial edema. No pneumothorax is noted. IMPRESSION: Stable vascular congestion and left basilar infiltrate. No new focal abnormality is seen. Electronically Signed   By: Inez Catalina M.D.   On: 09/04/2018 11:31   Dg Chest Port 1 View  Result Date: 08/22/2018 CLINICAL DATA:  Swan-Ganz placement EXAM: PORTABLE CHEST 1 VIEW COMPARISON:  08/12/2018 FINDINGS: Interval placement of Swan-Ganz catheter with tip in the central right pulmonary artery. No pneumothorax. Endotracheal tube, right central line and NG tube are unchanged. Cardiomegaly with vascular congestion and bilateral lower lobe airspace opacities, left greater than right. Slight improvement in the right lung base airspace opacity. IMPRESSION: Interval placement of right  Swan-Ganz catheter with the tip in the central right pulmonary artery. No pneumothorax. Cardiomegaly with vascular congestion and bilateral lower lobe airspace opacities, slightly improved on the right. Electronically Signed   By: Rolm Baptise M.D.   On: 08/29/2018 10:01   Dg Chest Port 1 View  Result Date: 08/15/2018 CLINICAL DATA:  Check endotracheal tube placement EXAM: PORTABLE CHEST 1 VIEW COMPARISON:  09/04/2018 FINDINGS: Endotracheal tube, right subclavian central line and nasogastric catheter are again noted and stable. Cardiac  shadow remains enlarged. Patchy increased density is noted in the right lung base slightly increased when compared with the prior exam. Mild central vascular congestion remains. No bony abnormality is noted. IMPRESSION: Stable vascular congestion. Slight increase in the degree of right-sided asymmetric opacity when compared with the prior exam. Again this may represent early infiltrate or focal edema. Electronically Signed   By: Inez Catalina M.D.   On: 09/11/2018 09:38   Dg Chest Port 1 View  Result Date: 08/23/2018 CLINICAL DATA:  Heart failure EXAM: PORTABLE CHEST 1 VIEW COMPARISON:  Chest x-ray 08/21/2018 at [facility]: Endotracheal tube tip is 2.4 cm above the carina. An orogastric tube and side-port reaches the stomach. Right subclavian central line with tip at the upper cavoatrial junction. Cardiomegaly. Indistinct, patchy right-sided lung opacity. No Kerley lines, effusion, or pneumothorax. IMPRESSION: 1. Unremarkable hardware as described. 2. Cardiomegaly. 3. Asymmetric right-sided lung opacity could be asymmetric edema, aspiration, or pneumonia. Electronically Signed   By: Monte Fantasia M.D.   On: 08/23/2018 20:21   Vas US Carotid  Result Date: 09/09/2018 Carotid Arterial Duplex Study Indications: CVA. Performing Technologist: Maudry Mayhew MHA, RDMS, RVT, RDCS  Examination Guidelines: A complete evaluation includes B-mode imaging, spectral Doppler, color Doppler, and power Doppler as needed of all accessible portions of each vessel. Bilateral testing is considered an integral part of a complete examination. Limited examinations for reoccurring indications may be performed as noted.  Right Carotid Findings: +--------+--------+--------+--------+--------+-------------------+         PSV cm/sEDV cm/sStenosisDescribeComments            +--------+--------+--------+--------+--------+-------------------+ CCA Mid 43      5                       High resistant flow  +--------+--------+--------+--------+--------+-------------------+ ICA Prox18                                                  +--------+--------+--------+--------+--------+-------------------+ ECA     37      7                                           +--------+--------+--------+--------+--------+-------------------+  Left Carotid Findings: +----------+--------+-------+--------+----------------------+------------------+           PSV cm/sEDV    StenosisDescribe              Comments                             cm/s                                                    +----------+--------+-------+--------+----------------------+------------------+  CCA Prox  88      18                                   intimal thickening +----------+--------+-------+--------+----------------------+------------------+ CCA Distal53      14                                   intimal thickening +----------+--------+-------+--------+----------------------+------------------+ ICA Prox  63      25             smooth and                                                                heterogenous                             +----------+--------+-------+--------+----------------------+------------------+ ICA Distal49      21                                                      +----------+--------+-------+--------+----------------------+------------------+ ECA       107     16             smooth and                                                                heterogenous                             +----------+--------+-------+--------+----------------------+------------------+ +----------+--------+--------+----------------+-------------------+ SubclavianPSV cm/sEDV cm/sDescribe        Arm Pressure (mmHG) +----------+--------+--------+----------------+-------------------+           77              Multiphasic, WNL                     +----------+--------+--------+----------------+-------------------+ +---------+--------+--+--------+--+---------+ VertebralPSV cm/s34EDV cm/s13Antegrade +---------+--------+--+--------+--+---------+  Summary: Right Carotid: Limited evaluation of right carotid artery system due to central                line placement.                There is evidence of a thumped right ICA waveform, suggestive of                possible distal ICA occlusion. Left Carotid: Velocities in the left ICA are consistent with a 1-39% stenosis. Vertebrals: Left vertebral artery demonstrates antegrade flow. *See table(s) above for measurements and observations.  Electronically signed by Antony Contras MD on 09/09/2018 at 2:53:57 PM.   Final    Vas [place] Lower Extremity Venous (dvt)  Result Date: 08/26/2018  Lower Venous Study Indications: Hx of dvt.  Performing Technologist:  Abram Sander  Examination Guidelines: A complete evaluation includes B-mode imaging, spectral Doppler, color Doppler, and power Doppler as needed of all accessible portions of each vessel. Bilateral testing is considered an integral part of a complete examination. Limited examinations for reoccurring indications may be performed as noted.  Right Venous Findings: +---------+---------------+---------+-----------+----------+--------------+          CompressibilityPhasicitySpontaneityPropertiesSummary        +---------+---------------+---------+-----------+----------+--------------+ CFV      Full           Yes      Yes                                 +---------+---------------+---------+-----------+----------+--------------+ SFJ      Full                                                        +---------+---------------+---------+-----------+----------+--------------+ FV Prox  Full                                                        +---------+---------------+---------+-----------+----------+--------------+ FV Mid   Full                                                         +---------+---------------+---------+-----------+----------+--------------+ FV DistalFull                                                        +---------+---------------+---------+-----------+----------+--------------+ PFV      Full                                                        +---------+---------------+---------+-----------+----------+--------------+ POP      Full           Yes      Yes                                 +---------+---------------+---------+-----------+----------+--------------+ PTV      Full                                                        +---------+---------------+---------+-----------+----------+--------------+ PERO  not visualized +---------+---------------+---------+-----------+----------+--------------+  Left Venous Findings: +---------+---------------+---------+-----------+----------+-------------------+          CompressibilityPhasicitySpontaneityPropertiesSummary             +---------+---------------+---------+-----------+----------+-------------------+ CFV                                                   not visualized due                                                        to bandage          +---------+---------------+---------+-----------+----------+-------------------+ SFJ                                                   not visualized due                                                        to bandage          +---------+---------------+---------+-----------+----------+-------------------+ FV Prox  Full                                                             +---------+---------------+---------+-----------+----------+-------------------+ FV Mid   Full                                                              +---------+---------------+---------+-----------+----------+-------------------+ FV DistalFull                                                             +---------+---------------+---------+-----------+----------+-------------------+ PFV      Full                                                             +---------+---------------+---------+-----------+----------+-------------------+ POP      Full           Yes      Yes                                      +---------+---------------+---------+-----------+----------+-------------------+ PTV  Full                                                             +---------+---------------+---------+-----------+----------+-------------------+ PERO                                                  not visualized      +---------+---------------+---------+-----------+----------+-------------------+    Summary: Right: There is no evidence of deep vein thrombosis in the lower extremity. However, portions of this examination were limited- see technologist comments above. No cystic structure found in the popliteal fossa. Left: There is no evidence of deep vein thrombosis in the lower extremity. However, portions of this examination were limited- see technologist comments above. No cystic structure found in the popliteal fossa.  *See table(s) above for measurements and observations. Electronically signed by Monica Martinez MD on 09/04/2018 at 6:11:29 PM.    Final    [place] Ekg Site Rite  Result Date: 09/08/2018 If Site Rite image not attached, placement could not be confirmed due to current cardiac rhythm.  US Abdomen Limited Ruq  Result Date: 09/10/2018 CLINICAL DATA:  Jaundice; recent percutaneous cholecystostomy EXAM: ULTRASOUND ABDOMEN LIMITED RIGHT UPPER QUADRANT COMPARISON:  Abdominal ultrasound of October 25th 2019 FINDINGS: Gallbladder: The gallbladder is nondistended and contains echogenic bile or sludge. A cholecystostomy  drainage tube is present. Common bile duct: Diameter: 4.3 mm. No stones or sludge are observed within the gallbladder. Liver: There is free fluid surrounding the liver. The hepatic parenchyma exhibits normal echotexture. There is no intrahepatic ductal dilation. Portal vein is patent on color Doppler imaging with normal direction of blood flow towards the liver. IMPRESSION: No common bile duct or intrahepatic ductal dilation. No evidence of choledocholithiasis. Nondistended gallbladder with a cholecystostomy tube present. There is sludge within the gallbladder lumen. Normal appearing liver. Considerable free fluid within the abdomen similar to that seen on the CT scan of September 05, 2018. Electronically Signed   By: David  Martinique M.D.   On: 09/10/2018 12:22   US Abdomen Limited Ruq  Result Date: 09/05/2018 CLINICAL DATA:  Acute cholecystitis. Status post cholecystostomy placement. EXAM: ULTRASOUND ABDOMEN LIMITED RIGHT UPPER QUADRANT COMPARISON:  Ultrasound of August 29, 2018. FINDINGS: Gallbladder: Cholelithiasis is noted with mild gallbladder wall thickening measuring 4 mm. Gallbladder is decompressed secondary to drainage catheter placement. No sonographic Murphy's sign is noted. Common bile duct: Diameter: 2.6 mm which is within normal limits. Liver: No focal lesion identified. Normal echogenicity of hepatic parenchyma is noted. Portal vein is patent on color Doppler imaging with normal direction of blood flow towards the liver. IMPRESSION: Cholelithiasis with mild gallbladder wall thickening is noted. Gallbladder is decompressed secondary to cholecystostomy placement. Electronically Signed   By: Marijo Conception, M.D.   On: 09/05/2018 15:08    Microbiology No results found for this or any previous visit (from the past 240 hour(s)).  Lab Basic Metabolic Panel: No results for input(s): NA, K, CL, CO2, GLUCOSE, BUN, CREATININE, CALCIUM, MG, PHOS in the last 168 hours. Liver Function Tests: No  results for input(s): AST, ALT, ALKPHOS, BILITOT, PROT, ALBUMIN in the last 168 hours. No results for input(s): LIPASE, AMYLASE  in the last 168 hours. No results for input(s): AMMONIA in the last 168 hours. CBC: No results for input(s): WBC, NEUTROABS, HGB, HCT, MCV, PLT in the last 168 hours. Cardiac Enzymes: No results for input(s): CKTOTAL, CKMB, CKMBINDEX, TROPONINI in the last 168 hours. Sepsis Labs: No results for input(s): PROCALCITON, WBC, LATICACIDVEN in the last 168 hours.  Procedures/Operations     Dejanee Thibeaux 09/24/2018, 3:58 PM

## 2018-10-12 DEATH — deceased

## 2019-05-07 IMAGING — US US ABDOMEN LIMITED
1 series · 14 of 25 positions shown · non-contrast
Comparison: Abdominal ultrasound Wednesday September, 2018

CLINICAL DATA: Jaundice; recent percutaneous cholecystostomy

EXAM:
ULTRASOUND ABDOMEN LIMITED RIGHT UPPER QUADRANT

[Series 1: us abdomen limited · 0.19mm/px · 14 of 48 slices shown]
[im 1/48]
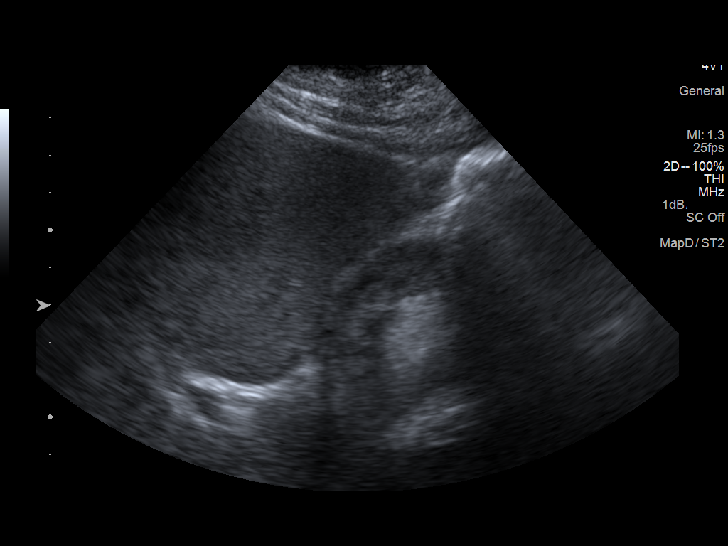
[im 4/48]
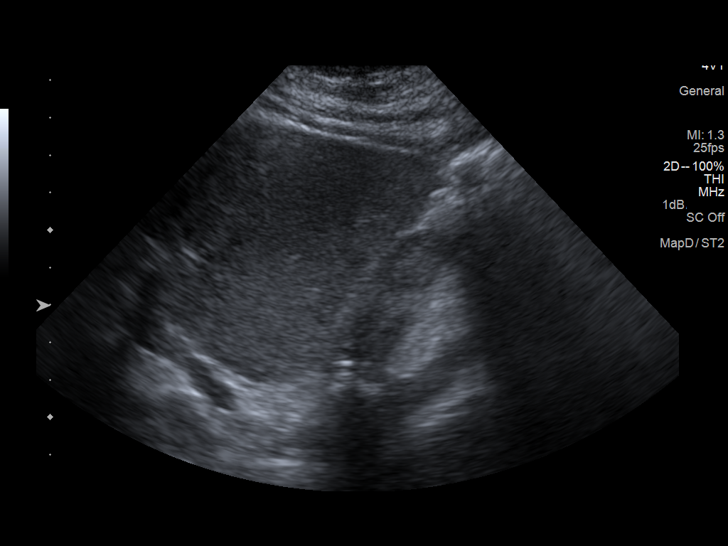
[im 8/48]
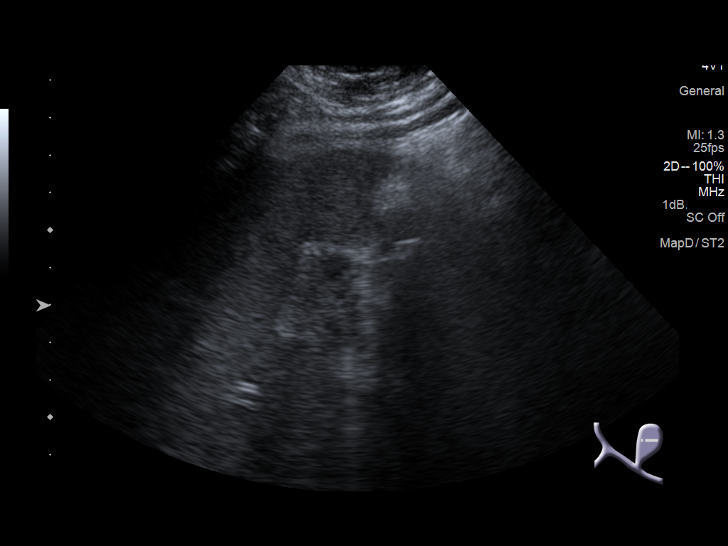
[im 12/48]
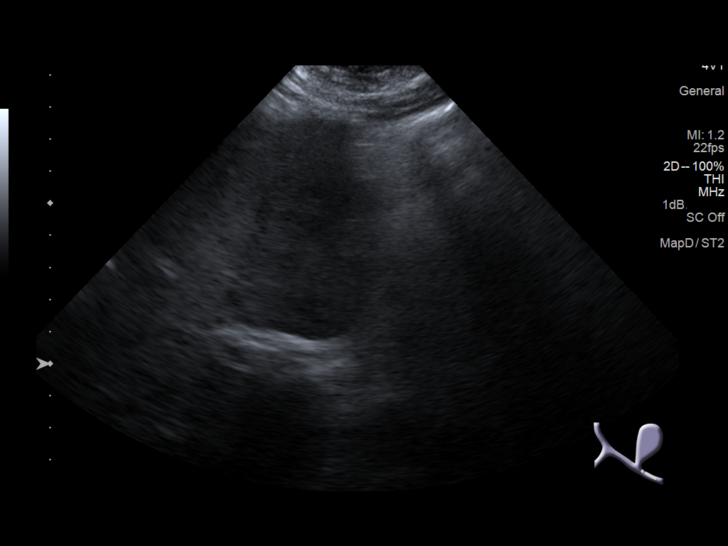
[im 16/48]
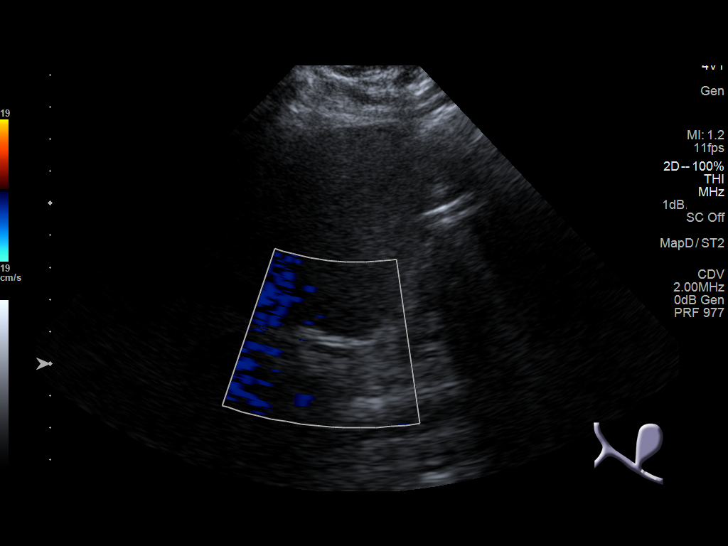
[im 18/48]
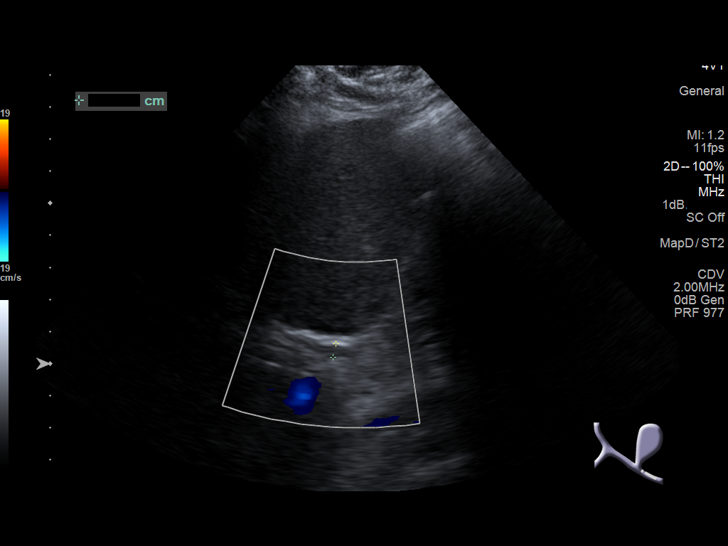
[im 22/48]
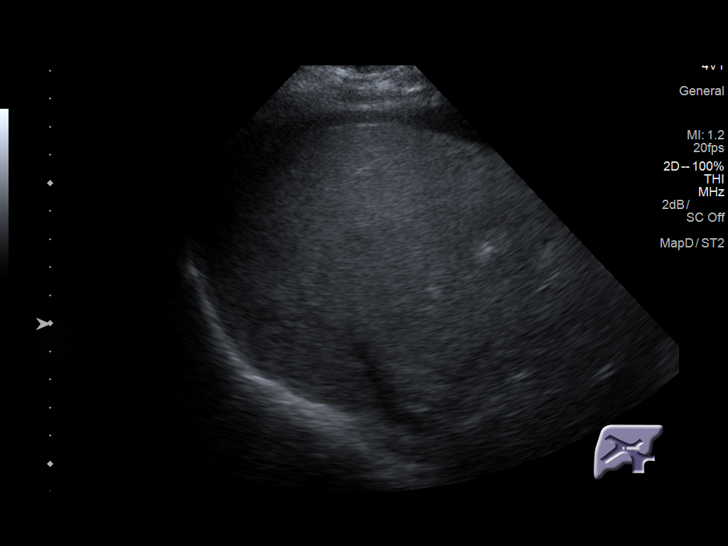
[im 26/48]
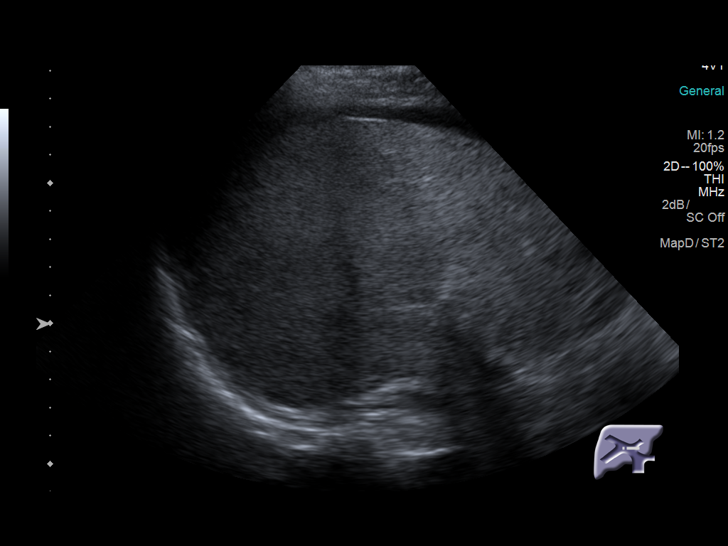
[im 30/48]
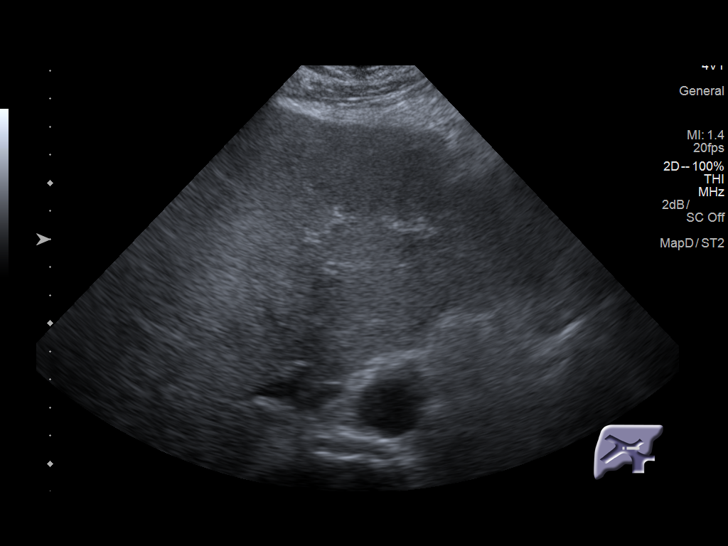
[im 32/48]
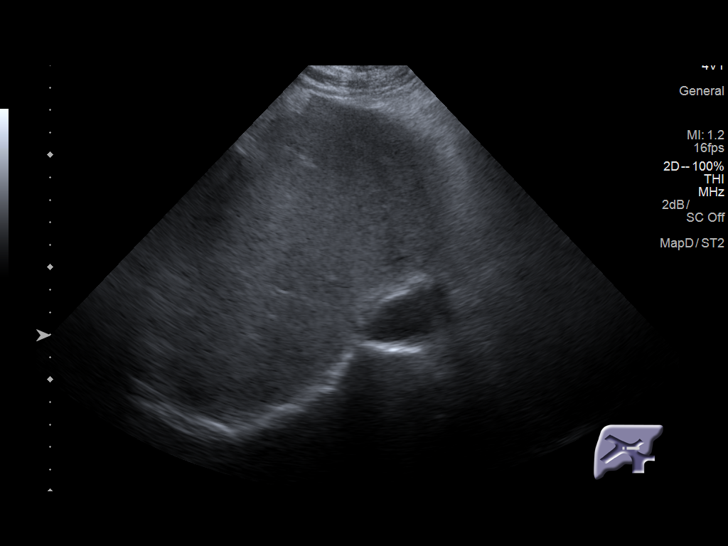
[im 36/48]
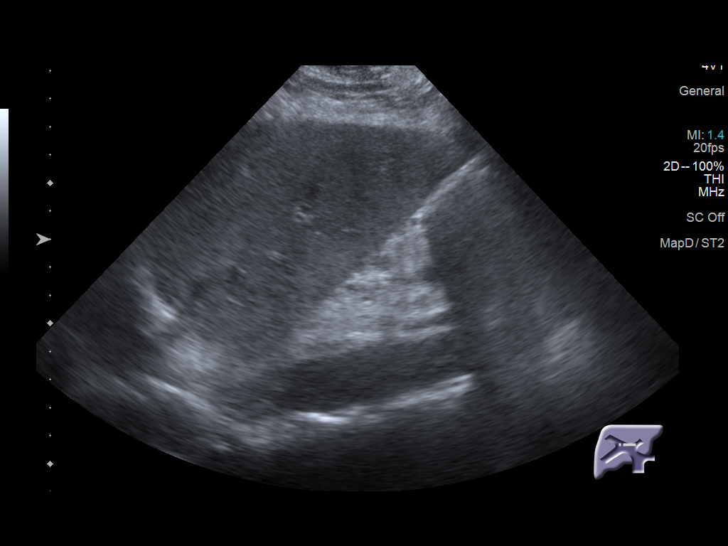
[im 40/48]
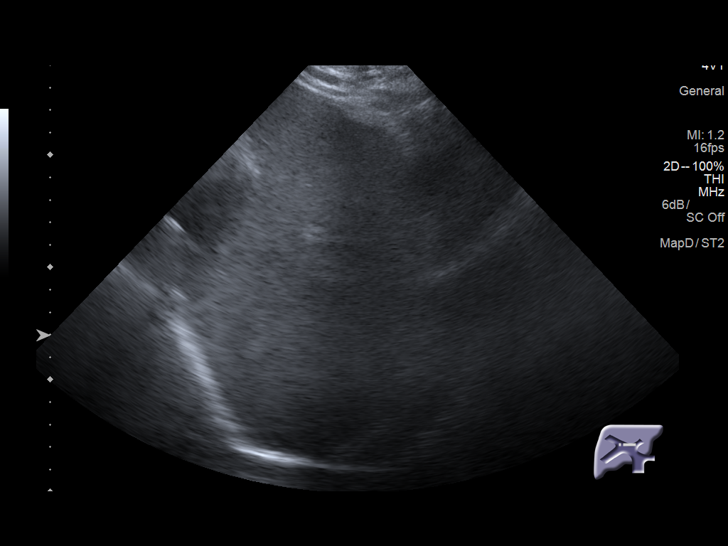
[im 44/48]
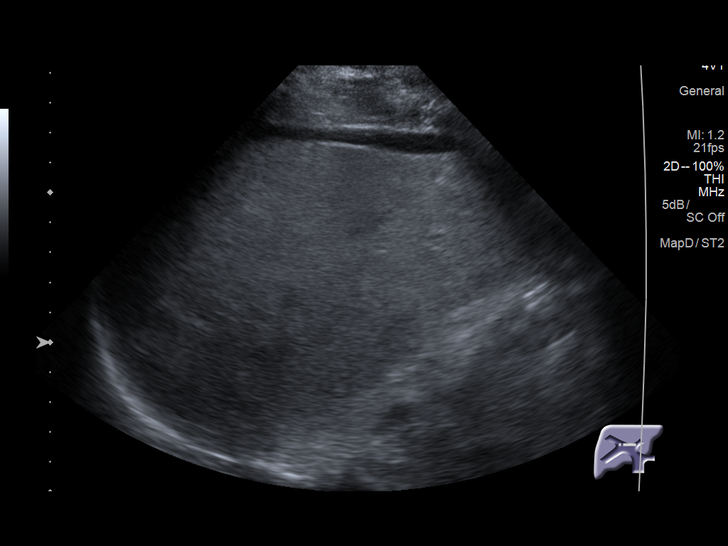
[im 48/48]
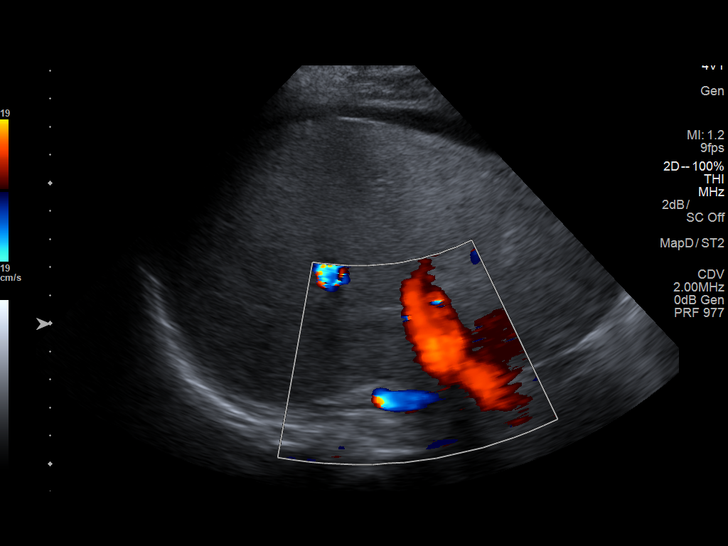

[14 of 25 positions shown; findings below may reference images not displayed]

FINDINGS: Gallbladder:

The gallbladder is nondistended and contains echogenic bile or
sludge. A cholecystostomy drainage tube is present.

Common bile duct:

Diameter: 4.3 mm. No stones or sludge are observed within the
gallbladder.

Liver:

There is free fluid surrounding the liver. The hepatic parenchyma
exhibits normal echotexture. There is no intrahepatic ductal
dilation. Portal vein is patent on color Doppler imaging with normal
direction of blood flow towards the liver.
IMPRESSION: No common bile duct or intrahepatic ductal dilation. No evidence of
choledocholithiasis.

Nondistended gallbladder with a cholecystostomy tube present. There
is sludge within the gallbladder lumen.

Normal appearing liver. Considerable free fluid within the abdomen
similar to that seen on the CT scan September 05, 2018.

## 2020-05-06 IMAGING — CT CT ABD-PELV W/O CM
2 of 4 series · 14 of 46 positions shown, 16 images · non-contrast
Comparison: Abdomen ultrasound, 09/05/2018. CTs, 07/12/2014 and
07/05/2014.

CLINICAL DATA: Pt is septic, s/p chole drain placed [DATE]. Pt has
continued to run a fever the last several days. Concern for possible
infection.

EXAM:
CT CHEST, ABDOMEN AND PELVIS WITHOUT CONTRAST
TECHNIQUE: Multidetector CT imaging of the chest, abdomen and pelvis was
performed following the standard protocol without IV contrast.

[Series 3: cap without · axial · non-contrast · 0.81mm/px · z∈[+964,+1554]mm · 11 of 138 slices shown, 13 images]
[im 10/138  soft-tissue]
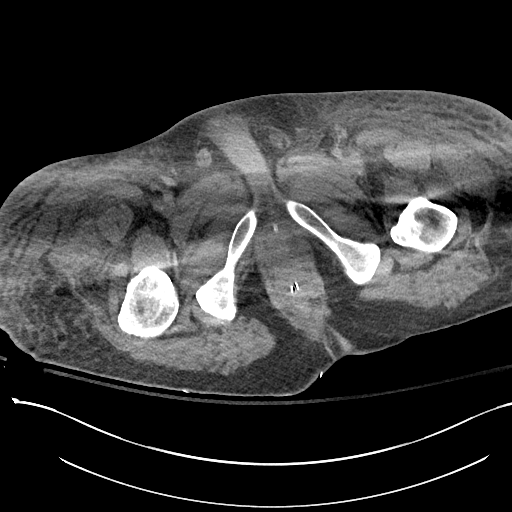
[im 10/138  bone]
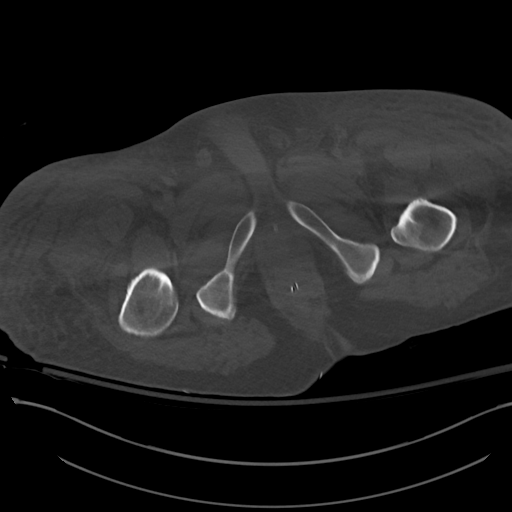
[im 20/138  soft-tissue]
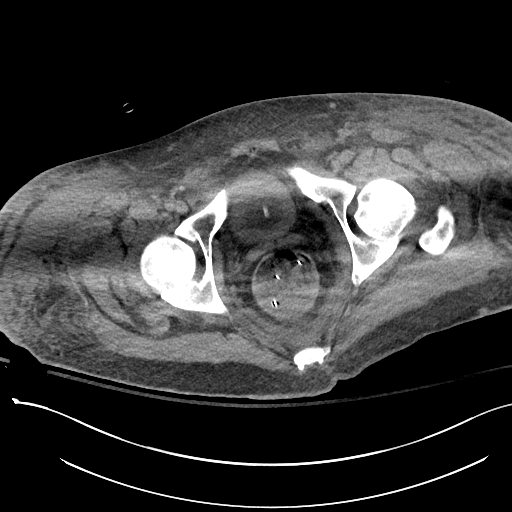
[im 30/138  soft-tissue]
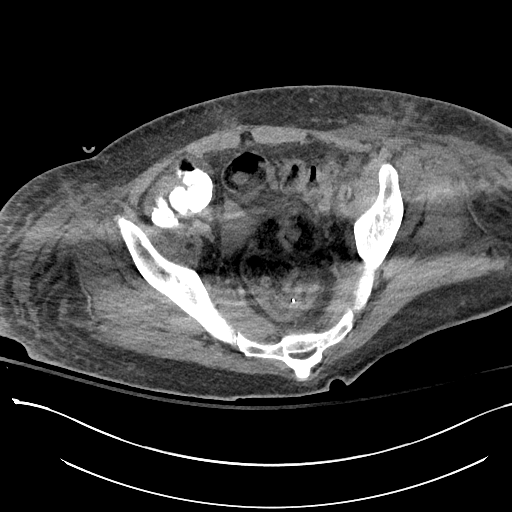
[im 49/138  soft-tissue]
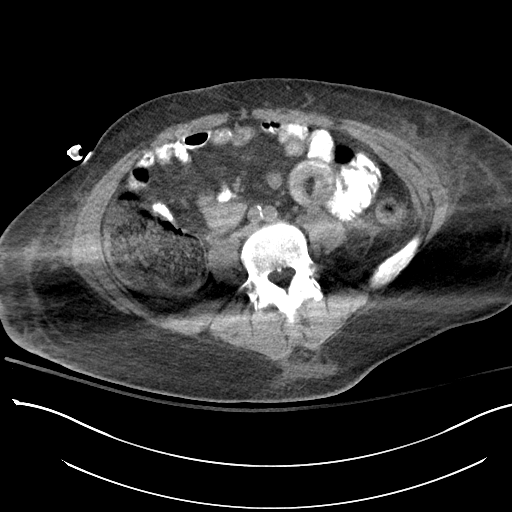
[im 59/138  soft-tissue]
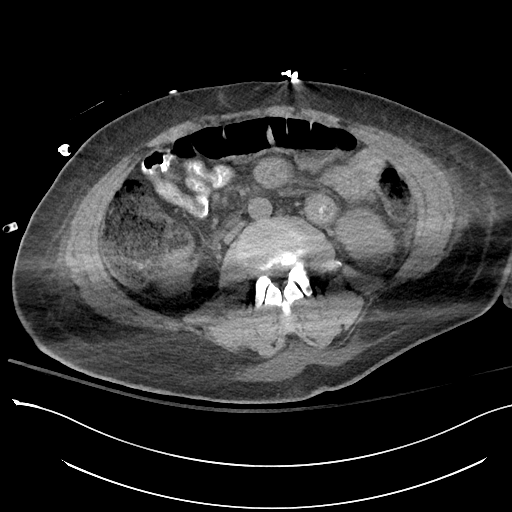
[im 69/138  soft-tissue]
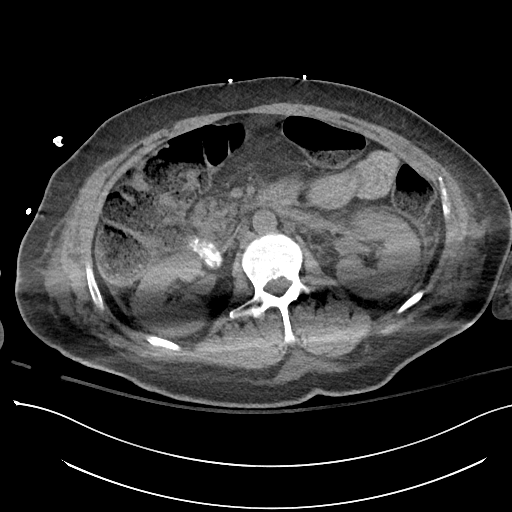
[im 79/138  soft-tissue]
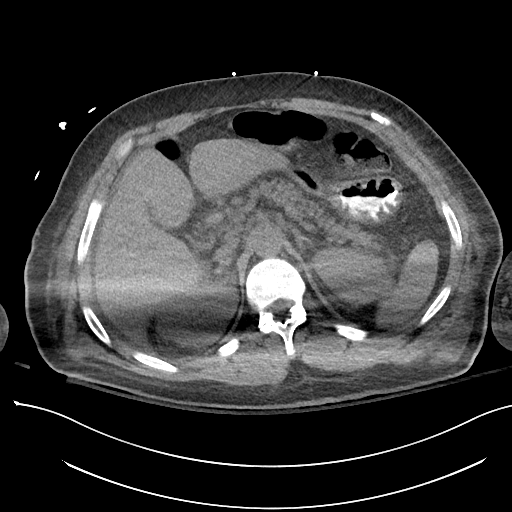
[im 89/138  soft-tissue]
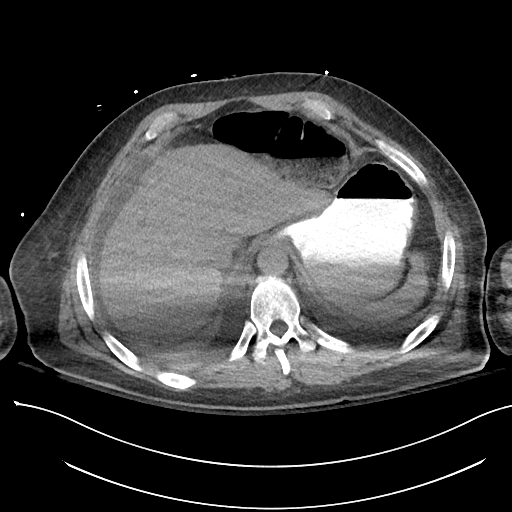
[im 108/138  soft-tissue]
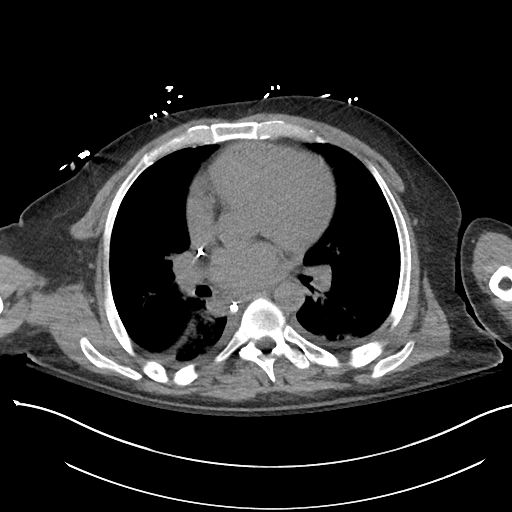
[im 108/138  bone]
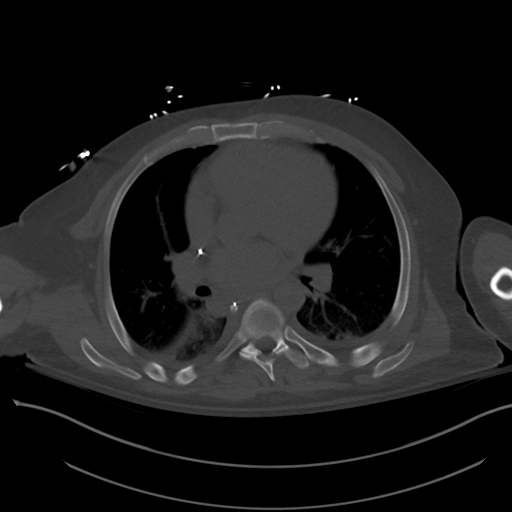
[im 118/138  soft-tissue]
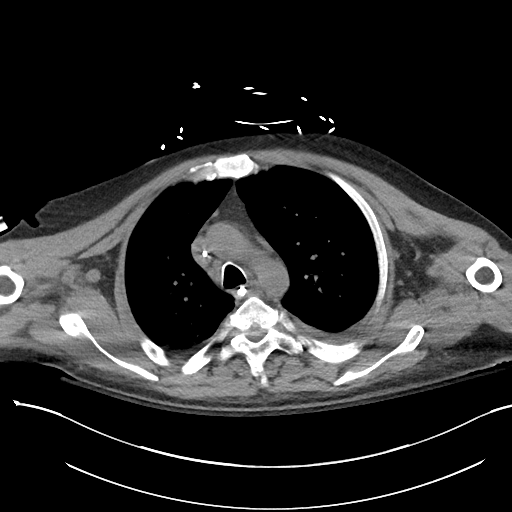
[im 128/138  soft-tissue]
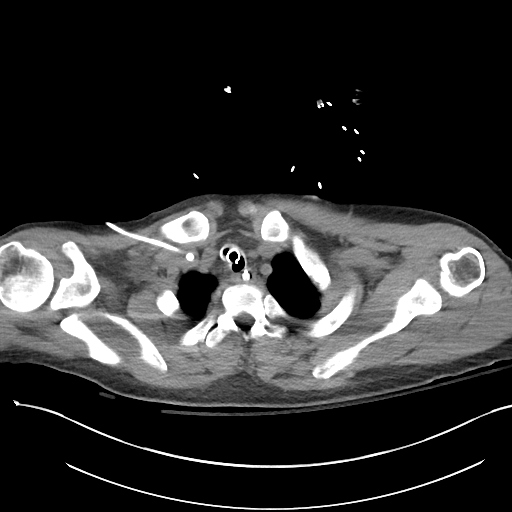

[Series 6: cor · coronal · 0.97mm/px · 3 of 82 slices shown]
[im 28/82  soft-tissue]
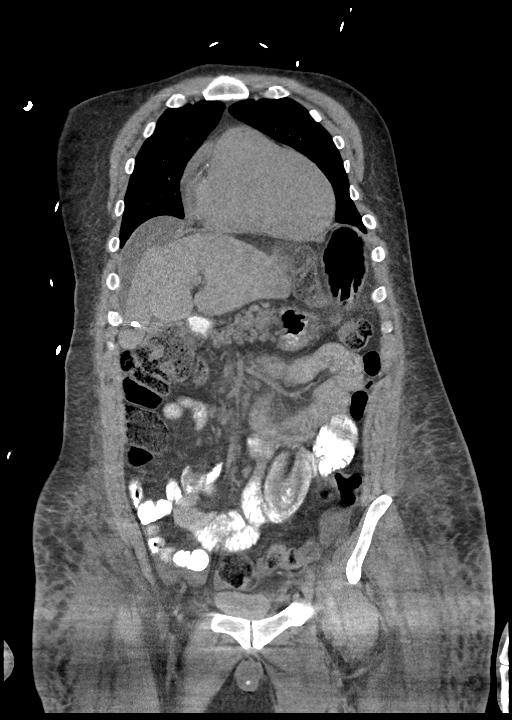
[im 37/82  soft-tissue]
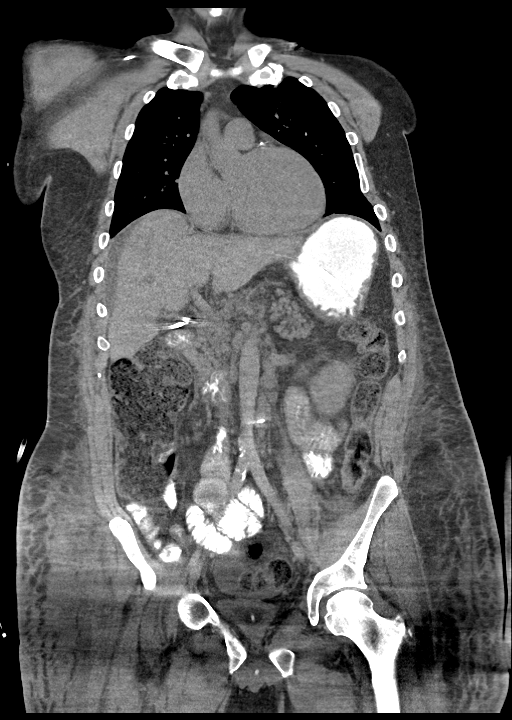
[im 46/82  soft-tissue]
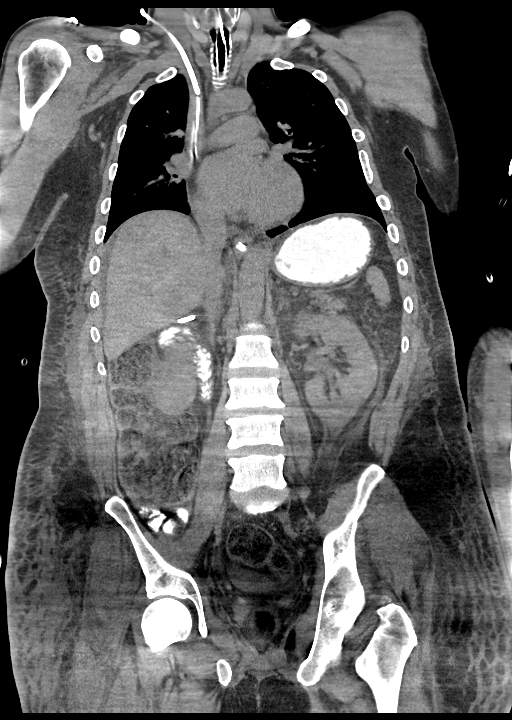

[14 of 46 positions shown; findings below may reference images not displayed]

FINDINGS: CT CHEST FINDINGS

Cardiovascular: Heart is mildly enlarged. No pericardial effusion.
Left and right coronary artery calcifications. Great vessels are
normal in caliber. No aortic atherosclerotic calcifications.

Mediastinum/Nodes: No neck base or axillary masses or enlarged lymph
nodes. No mediastinal or hilar masses or enlarged lymph nodes.
Endotracheal tube tip projects 4 cm above the Carina. Right internal
jugular central venous line tip lies at the caval atrial junction.
Nasal/orogastric tube passes below the diaphragm into the mid
stomach.

Lungs/Pleura: Small bilateral pleural effusions. There is dependent
opacity in both lower lobes with adjacent ground-glass type
opacities. Subtle hazy ground-glass opacity is noted in the
posterior right upper lobe and in the posterolateral left upper
lobe. There is no interstitial thickening or vascular congestion to
suggest pulmonary edema. No lung mass or discrete nodule. No
pneumothorax.

Musculoskeletal: Developmentally small disks from T3 through T5. No
fracture or acute finding. No osteoblastic or osteolytic lesions.

CT ABDOMEN PELVIS FINDINGS

Hepatobiliary: Liver normal in size with no mass or focal lesion.
Gallbladder is been decompressed with a cholecystotomy tube. There
is no fluid collection in the gallbladder fossa to suggest a bile
leak or abscess. No bile duct dilation.

Pancreas: Unremarkable. No pancreatic ductal dilatation or
surrounding inflammatory changes.

Spleen: Normal in size without focal abnormality.

Adrenals/Urinary Tract: No adrenal masses. Kidneys normal in size,
orientation and position. No renal masses, stones or hydronephrosis.
Normal ureters. Bladder decompressed with a Foley catheter.

Stomach/Bowel: Stomach is unremarkable. Small bowel is normal
caliber. No wall thickening or inflammation. Mild distention of the
right colon. No colonic wall thickening or inflammation. Mild
generalized increased stool burden noted in the colon most evident
in the right colon. Appendix not discretely visualized. No evidence
of appendicitis.

Vascular/Lymphatic: Aortic atherosclerosis. No enlarged abdominal or
pelvic lymph nodes.

Reproductive: Unremarkable.

Other: Minimal ascites noted adjacent to the liver and in the
pelvis. Diffuse subcutaneous edema noted along the lower abdomen and
pelvis extending to the proximal lower extremities. No abdominal
wall hernia.

Musculoskeletal: No acute or significant osseous findings.
IMPRESSION: CHEST CT

1. There are areas of ground-glass opacity in the lungs. This may be
due to mild airspace edema or infection/inflammation.
2. There is dependent opacity in both lower lobes that is most
consistent with atelectasis, associated with small effusions.
3. Mild cardiomegaly.

ABDOMEN AND PELVIS CT

1. Status post cholecystotomy for decompression. Tube is well
positioned. No gallbladder fossa collection to suggest a bile leak
or abscess.
2. Minimal amount of ascites noted adjacent to the liver and in the
pelvis. This is nonspecific and along with lower abdominal and
pelvic soft tissue edema and small pleural effusions suggests a
diffuse edematous state.
3. No acute findings within the abdomen or pelvis.
4. Mild generalized increased stool in the colon. No bowel
inflammation or obstruction.
